# Patient Record
Sex: Male | Born: 1990 | Race: White | Hispanic: No | Marital: Single | Smoking: Current every day smoker
Health system: Southern US, Community
[De-identification: ages and names within clinical notes are randomized; demographics above are authoritative.]

## PROBLEM LIST (undated history)

## (undated) DIAGNOSIS — I10 Essential (primary) hypertension: Secondary | ICD-10-CM

## (undated) DIAGNOSIS — F32A Depression, unspecified: Secondary | ICD-10-CM

## (undated) DIAGNOSIS — R519 Headache, unspecified: Secondary | ICD-10-CM

## (undated) DIAGNOSIS — F319 Bipolar disorder, unspecified: Secondary | ICD-10-CM

## (undated) DIAGNOSIS — F419 Anxiety disorder, unspecified: Secondary | ICD-10-CM

## (undated) DIAGNOSIS — R011 Cardiac murmur, unspecified: Secondary | ICD-10-CM

## (undated) HISTORY — PX: KNEE SURGERY: SHX244

---
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
## 2012-01-17 ENCOUNTER — Emergency Department (HOSPITAL_COMMUNITY)
Admission: EM | Admit: 2012-01-17 | Discharge: 2012-01-17 | Disposition: A | Discharge status: Home / Self Care | Payer: Self-pay | Source: Home / Self Care | Attending: Emergency Medicine | Admitting: Emergency Medicine

## 2012-01-17 ENCOUNTER — Encounter (HOSPITAL_COMMUNITY): Payer: Self-pay | Admitting: *Deleted

## 2012-01-17 DIAGNOSIS — M7700 Medial epicondylitis, unspecified elbow: Secondary | ICD-10-CM

## 2012-01-17 DIAGNOSIS — L0292 Furuncle, unspecified: Secondary | ICD-10-CM

## 2012-01-17 MED ORDER — MUPIROCIN 2 % EX OINT: TOPICAL_OINTMENT | Freq: Three times a day (TID) | CUTANEOUS | Status: AC

## 2012-01-17 MED ORDER — MELOXICAM 7.5 MG PO TABS: 7.5000 mg | ORAL_TABLET | Freq: Every day | ORAL | Status: AC

## 2012-01-17 NOTE — ED Notes (Signed)
Pt reports elbow pain with no known injury that started hurting over 7 days ago. No obvious injury present - no bruising,swelling or redness noted.

## 2012-01-17 NOTE — ED Provider Notes (Signed)
History     CSN: [id number]  Arrival date & time 01/17/12  1708   First MD Initiated Contact with Patient 01/17/12 1716      Chief Complaint  Patient presents with  . Joint Swelling    (Consider location/radiation/quality/duration/timing/severity/associated sxs/prior treatment) HPI Comments: Patient presents to urgent care this afternoon experiencing right medial elbow pain that started about 7 days ago. Patient describes that he has not had any falls injuries or particular instance that he can relate to except for the fact that he plays basketball frequently. Patient denies any other symptoms such as redness, increased warmth, tingling or numbness sensation of his hand or shoulder pain. Patient also denies having had anything like this before. Patient also describes that he has at this 2 little bumps on his forearm in which he has tried to squeeze them to see if he could obtain some type of discharge but he never did.  The history is provided by the patient.    History reviewed. No pertinent past medical history.  History reviewed. No pertinent past surgical history.  Family History  Problem Relation Age of Onset  . Family history unknown: Yes    History  Substance Use Topics  . Smoking status: Never Smoker   . Smokeless tobacco: Not on file  . Alcohol Use: No      Review of Systems  Constitutional: Positive for activity change. Negative for fever, chills, diaphoresis, fatigue and unexpected weight change.    Allergies  Review of patient's allergies indicates not on file.  Home Medications   Current Outpatient Rx  Name Route Sig Dispense Refill  . MELOXICAM 7.5 MG PO TABS Oral Take 1 tablet (7.5 mg total) by mouth daily. 14 tablet 0  . MUPIROCIN 2 % EX OINT Topical Apply topically 3 (three) times daily. Apply to affected skin for 7 days 22 g 0    BP 150/90  Pulse 70  Temp 97.4 F (36.3 C) (Oral)  Resp 16  SpO2 100%  Physical Exam  Nursing note and vitals  reviewed. Constitutional: Vital signs are normal. He appears well-developed and well-nourished.  Non-toxic appearance. He does not have a sickly appearance. He does not appear ill. No distress.  Musculoskeletal: He exhibits tenderness. He exhibits no edema.       Right elbow: He exhibits swelling and deformity. He exhibits normal range of motion, no effusion and no laceration. tenderness found. Medial epicondyle tenderness noted.       Arms: Neurological: He is alert.  Skin: Skin is warm. Rash noted. Rash is papular. No erythema.       ED Course  Procedures (including critical care time)  Labs Reviewed - No data to display No results found.   1. Epicondylitis elbow, medial   2. Furunculosis       MDM  Right medial epicondylitis. Patient is reporting that he plays basketball and denies any recent falls or injuries that could explain an alternative orthopedic condition the size medial epicondylitis as suggested and consistent with his physical exam. No signs of a localized infectious process to suspect a septic joint. Have advised patient to use a helpful tennis sleeve to provide mild compression along with anti-inflammatory course of 2 weeks. Have advised him about symptoms that should warrant immediate attention which include increased swelling redness or any fevers or chills. Have also encouraged patient to followup with orthopedic Dr. if this pain was to persist despite this current recommended measures.  Jimmie Molly, MD 01/17/12 2031

## 2012-11-16 ENCOUNTER — Encounter (HOSPITAL_COMMUNITY): Payer: Self-pay | Admitting: *Deleted

## 2012-11-16 ENCOUNTER — Emergency Department (HOSPITAL_COMMUNITY)
Admission: EM | Admit: 2012-11-16 | Discharge: 2012-11-17 | Disposition: A | Discharge status: Other Acute Inpatient Hospital | Payer: No Typology Code available for payment source | Attending: Emergency Medicine | Admitting: Emergency Medicine

## 2012-11-16 DIAGNOSIS — F1210 Cannabis abuse, uncomplicated: Secondary | ICD-10-CM | POA: Insufficient documentation

## 2012-11-16 DIAGNOSIS — R451 Restlessness and agitation: Secondary | ICD-10-CM

## 2012-11-16 DIAGNOSIS — F10920 Alcohol use, unspecified with intoxication, uncomplicated: Secondary | ICD-10-CM

## 2012-11-16 DIAGNOSIS — IMO0002 Reserved for concepts with insufficient information to code with codable children: Secondary | ICD-10-CM | POA: Insufficient documentation

## 2012-11-16 DIAGNOSIS — F17200 Nicotine dependence, unspecified, uncomplicated: Secondary | ICD-10-CM | POA: Insufficient documentation

## 2012-11-16 DIAGNOSIS — F1010 Alcohol abuse, uncomplicated: Secondary | ICD-10-CM | POA: Insufficient documentation

## 2012-11-16 DIAGNOSIS — R45850 Homicidal ideations: Secondary | ICD-10-CM | POA: Insufficient documentation

## 2012-11-16 LAB — COMPREHENSIVE METABOLIC PANEL
ALT: 26 U/L (ref 0–53)
AST: 28 U/L (ref 0–37)
Albumin: 4.6 g/dL (ref 3.5–5.2)
Alkaline Phosphatase: 76 U/L (ref 39–117)
CO2: 26 mEq/L (ref 19–32)
Chloride: 98 mEq/L (ref 96–112)
Potassium: 3.8 mEq/L (ref 3.5–5.1)
Sodium: 136 mEq/L (ref 135–145)
Total Bilirubin: 0.4 mg/dL (ref 0.3–1.2)

## 2012-11-16 LAB — SALICYLATE LEVEL: Salicylate Lvl: <2 mg/dL — ABNORMAL LOW (ref 2.8–20.0)

## 2012-11-16 LAB — RAPID URINE DRUG SCREEN, HOSP PERFORMED
Amphetamines: NOT DETECTED
Benzodiazepines: NOT DETECTED
Tetrahydrocannabinol: POSITIVE — AB

## 2012-11-16 LAB — CBC
Platelets: 241 10*3/uL (ref 150–400)
RBC: 4.74 MIL/uL (ref 4.22–5.81)
RDW: 12.6 % (ref 11.5–15.5)
WBC: 8.5 10*3/uL (ref 4.0–10.5)

## 2012-11-16 LAB — ACETAMINOPHEN LEVEL: Acetaminophen (Tylenol), Serum: <15 ug/mL (ref 10–30)

## 2012-11-16 MED ORDER — LORAZEPAM 1 MG PO TABS: 1.0000 mg | ORAL_TABLET | Freq: Three times a day (TID) | ORAL | Status: DC | PRN

## 2012-11-16 MED ORDER — ZOLPIDEM TARTRATE 5 MG PO TABS: 5.0000 mg | ORAL_TABLET | Freq: Every evening | ORAL | Status: DC | PRN

## 2012-11-16 MED ORDER — ALUM & MAG HYDROXIDE-SIMETH 200-200-20 MG/5ML PO SUSP: 30.0000 mL | ORAL | Status: DC | PRN

## 2012-11-16 MED ORDER — IBUPROFEN 600 MG PO TABS: 600.0000 mg | ORAL_TABLET | Freq: Three times a day (TID) | ORAL | Status: DC | PRN

## 2012-11-16 MED ORDER — ONDANSETRON HCL 4 MG PO TABS: 4.0000 mg | ORAL_TABLET | Freq: Three times a day (TID) | ORAL | Status: DC | PRN

## 2012-11-16 MED ORDER — NICOTINE 21 MG/24HR TD PT24: 21.0000 mg | MEDICATED_PATCH | Freq: Every day | TRANSDERMAL | Status: DC

## 2012-11-16 NOTE — ED Notes (Signed)
[id number]> Expected date:<BR> Expected time:<BR> Means of arrival:<BR> Comments:<BR> ems
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
## 2012-11-17 ENCOUNTER — Inpatient Hospital Stay (HOSPITAL_COMMUNITY)
Admission: AD | Admit: 2012-11-17 | Payer: No Typology Code available for payment source | Source: Intra-hospital | Admitting: Psychiatry

## 2012-11-17 DIAGNOSIS — R413 Other amnesia: Secondary | ICD-10-CM

## 2012-11-17 DIAGNOSIS — F329 Major depressive disorder, single episode, unspecified: Secondary | ICD-10-CM

## 2012-11-17 DIAGNOSIS — G4700 Insomnia, unspecified: Secondary | ICD-10-CM

## 2012-11-17 DIAGNOSIS — F1910 Other psychoactive substance abuse, uncomplicated: Secondary | ICD-10-CM
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
## 2012-11-17 NOTE — ED Notes (Signed)
Pt was calm and cooperative and slept all night

## 2012-11-17 NOTE — ED Provider Notes (Signed)
First Opinion signed by me.   Devoria Albe, MD, Armando Gang   Ward Givens, MD 11/17/12 [phone]

## 2012-11-17 NOTE — BHH Counselor (Signed)
Britta Mccreedy RN at [facility] - pt is on wait list. She said [facility] may want to be considered for him. Auth #[id number]  Evette Cristal, LCSWA Assessment Counselor

## 2012-11-17 NOTE — ED Provider Notes (Signed)
Medical screening examination/treatment/procedure(s) were performed by non-physician practitioner and as supervising physician I was immediately available for consultation/collaboration.   Nelia Shi, MD 11/17/12 2228

## 2012-11-17 NOTE — BHH Counselor (Addendum)
Per Thayer Ohm, patient accepted to [facility] by Dr. Malvin Johns. Call report # [phone]. Marland Kitchen EDP-Dr. Rhunette Croft made aware of patient's discharge. Also, patient's nurse-Eric was made aware. Patient is here under IVC and will be transported to the facility via [employer]. Patient made aware of his disposition.

## 2012-11-17 NOTE — BHH Counselor (Signed)
6/8 (1133) declined at [facility] by [facility] due to acuity on a previous admission.   6/9 Pt referred to [facility], [place], [address], [place], and [facility].    6/9 No beds at [place], [place], [address], [place], [place], [place], [place], [facility], [place], and [facility]	[place].  6/9/ Pt referred and accepted to the [facility] waitlist, per Britta Mccreedy RN [id number] on 6/9. [facility] auth# [id number]  - 6/9 to 6/15

## 2012-11-17 NOTE — Progress Notes (Signed)
[facility] has seen patient and provided him with a list of primary care resources. °
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
## 2013-11-10 ENCOUNTER — Emergency Department (INDEPENDENT_AMBULATORY_CARE_PROVIDER_SITE_OTHER)
Admission: EM | Admit: 2013-11-10 | Discharge: 2013-11-10 | Disposition: A | Discharge status: Home / Self Care | Payer: Self-pay | Source: Home / Self Care | Attending: Family Medicine | Admitting: Family Medicine

## 2013-11-10 ENCOUNTER — Encounter (HOSPITAL_COMMUNITY): Payer: Self-pay | Admitting: Emergency Medicine

## 2013-11-10 DIAGNOSIS — L0390 Cellulitis, unspecified: Secondary | ICD-10-CM

## 2013-11-10 DIAGNOSIS — L089 Local infection of the skin and subcutaneous tissue, unspecified: Secondary | ICD-10-CM

## 2013-11-10 DIAGNOSIS — L723 Sebaceous cyst: Secondary | ICD-10-CM

## 2013-11-10 DIAGNOSIS — L0291 Cutaneous abscess, unspecified: Secondary | ICD-10-CM

## 2013-11-10 DIAGNOSIS — Z23 Encounter for immunization: Secondary | ICD-10-CM

## 2013-11-10 MED ORDER — TETANUS-DIPHTH-ACELL PERTUSSIS 5-2.5-18.5 LF-MCG/0.5 IM SUSP
INTRAMUSCULAR | Status: AC
  Filled 2013-11-10: qty 0.5

## 2013-11-10 MED ORDER — TETANUS-DIPHTH-ACELL PERTUSSIS 5-2.5-18.5 LF-MCG/0.5 IM SUSP
0.5000 mL | Freq: Once | INTRAMUSCULAR | Status: AC
  Administered 2013-11-10: 0.5 mL via INTRAMUSCULAR

## 2013-11-10 MED ORDER — SULFAMETHOXAZOLE-TRIMETHOPRIM 800-160 MG PO TABS: 1.0000 | ORAL_TABLET | Freq: Two times a day (BID) | ORAL | Status: DC

## 2013-11-10 NOTE — ED Provider Notes (Signed)
CSN: [id number]     Arrival date & time 11/10/13  1014 History   First MD Initiated Contact with Patient 11/10/13 1054     Chief Complaint  Patient presents with  . Recurrent Skin Infections   (Consider location/radiation/quality/duration/timing/severity/associated sxs/prior Treatment) HPI Comments: 23 year old male presents complaining of possible abscess in his right groin area. He has had a small bump there for about 6 months but it only recently became more swollen and tender, and very hard skin around it. He went with his father to his primary care physician who said it is probably an abscess that needs to be drained so he came here. He denies any systemic symptoms. He tried to poke it with a knife at home but it was too painful so he stopped. He did cause some pus to be expressed.   History reviewed. No pertinent past medical history. History reviewed. No pertinent past surgical history. No family history on file. History  Substance Use Topics  . Smoking status: Current Every Day Smoker  . Smokeless tobacco: Not on file  . Alcohol Use: Yes    Review of Systems  Skin: Positive for wound (see history of present illness).  All other systems reviewed and are negative.   Allergies  Review of patient's allergies indicates no known allergies.  Home Medications   Prior to Admission medications   Medication Sig Start Date End Date Taking? Authorizing Provider  sulfamethoxazole-trimethoprim (SEPTRA DS) 800-160 MG per tablet Take 1 tablet by mouth every 12 (twelve) hours. 11/10/13   Adrian Blackwater Kingstin Heims, PA-C   BP 148/99  Pulse 75  Temp(Src) 98.9 F (37.2 C) (Oral)  Resp 18  SpO2 97% Physical Exam  Nursing note and vitals reviewed. Constitutional: He is oriented to person, place, and time. He appears well-developed and well-nourished. No distress.  HENT:  Head: Normocephalic.  Pulmonary/Chest: Effort normal. No respiratory distress.  Neurological: He is alert and oriented to person,  place, and time. Coordination normal.  Skin: Skin is warm and dry. No rash noted. He is not diaphoretic.  In the right groin, there is a 2 x 4 cm area of induration with central fluctuance, with some bloody drainage centrally  Psychiatric: He has a normal mood and affect. Judgment normal.    ED Course  INCISION AND DRAINAGE Date/Time: 11/10/2013 11:27 AM Performed by: Autumn Messing, H Authorized by: Bradd Canary D Consent: Verbal consent obtained. Risks and benefits: risks, benefits and alternatives were discussed Consent given by: patient Patient identity confirmed: verbally with patient Time out: Immediately prior to procedure a "time out" was called to verify the correct patient, procedure, equipment, support staff and site/side marked as required. Type: abscess Location: Right groin. Anesthesia: local infiltration Local anesthetic: lidocaine 2% with epinephrine Anesthetic total: 4 ml Patient sedated: no Scalpel size: 11 Incision type: single straight Complexity: simple Drainage: bloody Drainage amount: scant Packing material: 1/4 in iodoform gauze Patient tolerance: Patient tolerated the procedure well with no immediate complications.   (including critical care time) Labs Review Labs Reviewed  CULTURE, ROUTINE-ABSCESS    Imaging Review No results found.   MDM   1. Infected sebaceous cyst   2. Abscess    Put on Bactrim for one week. Followup in 3 days for packing removal and wound check.  TDaP given    Meds ordered this encounter  Medications  . sulfamethoxazole-trimethoprim (SEPTRA DS) 800-160 MG per tablet    Sig: Take 1 tablet by mouth every 12 (twelve) hours.  Dispense:  14 tablet    Refill:  0    Order Specific Question:  Supervising Provider    Answer:  Bradd CanaryKINDL, JAMES D [[id number]]       Graylon GoodZachary H Yamilee Harmes, PA-C 11/10/13 1135

## 2013-11-10 NOTE — Discharge Instructions (Signed)
Abscess  Care After  An abscess (also called a boil or furuncle) is an infected area that contains a collection of pus. Signs and symptoms of an abscess include pain, tenderness, redness, or hardness, or you may feel a moveable soft area under your skin. An abscess can occur anywhere in the body. The infection may spread to surrounding tissues causing cellulitis. A cut (incision) by the surgeon was made over your abscess and the pus was drained out. Gauze may have been packed into the space to provide a drain that will allow the cavity to heal from the inside outwards. The boil may be painful for 5 to 7 days. Most people with a boil do not have high fevers. Your abscess, if seen early, may not have localized, and may not have been lanced. If not, another appointment may be required for this if it does not get better on its own or with medications.  HOME CARE INSTRUCTIONS   · Only take over-the-counter or prescription medicines for pain, discomfort, or fever as directed by your caregiver.  · When you bathe, soak and then remove gauze or iodoform packs at least daily or as directed by your caregiver. You may then wash the wound gently with mild soapy water. Repack with gauze or do as your caregiver directs.  SEEK IMMEDIATE MEDICAL CARE IF:   · You develop increased pain, swelling, redness, drainage, or bleeding in the wound site.  · You develop signs of generalized infection including muscle aches, chills, fever, or a general ill feeling.  · An oral temperature above 102° F (38.9° C) develops, not controlled by medication.  See your caregiver for a recheck if you develop any of the symptoms described above. If medications (antibiotics) were prescribed, take them as directed.  Document Released: 12/14/2004 Document Revised: 08/20/2011 Document Reviewed: 08/11/2007  [employer]® Patient Information ©2014 [employer].

## 2013-11-10 NOTE — ED Provider Notes (Signed)
Medical screening examination/treatment/procedure(s) were performed by resident physician or non-physician practitioner and as supervising physician I was immediately available for consultation/collaboration.   Muhammadali Ries DOUGLAS MD.   Morrissa Shein D Viggo Perko, MD 11/10/13 1553 

## 2013-11-10 NOTE — ED Notes (Signed)
Patient complains of abscess right groin.

## 2013-11-13 ENCOUNTER — Emergency Department (INDEPENDENT_AMBULATORY_CARE_PROVIDER_SITE_OTHER)
Admission: EM | Admit: 2013-11-13 | Discharge: 2013-11-13 | Disposition: A | Discharge status: Home / Self Care | Payer: Self-pay | Source: Home / Self Care | Attending: Family Medicine | Admitting: Family Medicine

## 2013-11-13 ENCOUNTER — Encounter (HOSPITAL_COMMUNITY): Payer: Self-pay | Admitting: Emergency Medicine

## 2013-11-13 DIAGNOSIS — L0390 Cellulitis, unspecified: Secondary | ICD-10-CM

## 2013-11-13 DIAGNOSIS — L0291 Cutaneous abscess, unspecified: Secondary | ICD-10-CM

## 2013-11-13 LAB — CULTURE, ROUTINE-ABSCESS: Culture: NO GROWTH

## 2013-11-13 NOTE — Discharge Instructions (Signed)
Thank you for coming in today. Come back as needed.   Abscess Care After An abscess (also called a boil or furuncle) is an infected area that contains a collection of pus. Signs and symptoms of an abscess include pain, tenderness, redness, or hardness, or you may feel a moveable soft area under your skin. An abscess can occur anywhere in the body. The infection may spread to surrounding tissues causing cellulitis. A cut (incision) by the surgeon was made over your abscess and the pus was drained out. Gauze may have been packed into the space to provide a drain that will allow the cavity to heal from the inside outwards. The boil may be painful for 5 to 7 days. Most people with a boil do not have high fevers. Your abscess, if seen early, may not have localized, and may not have been lanced. If not, another appointment may be required for this if it does not get better on its own or with medications. HOME CARE INSTRUCTIONS   Only take over-the-counter or prescription medicines for pain, discomfort, or fever as directed by your caregiver.  When you bathe, soak and then remove gauze or iodoform packs at least daily or as directed by your caregiver. You may then wash the wound gently with mild soapy water. Repack with gauze or do as your caregiver directs. SEEK IMMEDIATE MEDICAL CARE IF:   You develop increased pain, swelling, redness, drainage, or bleeding in the wound site.  You develop signs of generalized infection including muscle aches, chills, fever, or a general ill feeling.  An oral temperature above 102 F (38.9 C) develops, not controlled by medication. See your caregiver for a recheck if you develop any of the symptoms described above. If medications (antibiotics) were prescribed, take them as directed. Document Released: 12/14/2004 Document Revised: 08/20/2011 Document Reviewed: 08/11/2007 [facility] Patient Information 2014 [place], [place].

## 2013-11-13 NOTE — ED Notes (Signed)
Pt here for follow up of cyst in groin area.  States still tender to touch and having some drainage

## 2013-11-13 NOTE — ED Provider Notes (Signed)
Adam Novak is a 23 y.o. male who presents to Urgent Care today for abscess. Patient is here today to followup abscess in the right groin. It was incised drained and packed 3 days ago. He feels well with no fevers chills nausea vomiting or diarrhea. Pain is improving significantly.   History reviewed. No pertinent past medical history. History  Substance Use Topics  . Smoking status: Current Every Day Smoker  . Smokeless tobacco: Not on file  . Alcohol Use: Yes   ROS as above Medications: No current facility-administered medications for this encounter.   Current Outpatient Prescriptions  Medication Sig Dispense Refill  . sulfamethoxazole-trimethoprim (SEPTRA DS) 800-160 MG per tablet Take 1 tablet by mouth every 12 (twelve) hours.  14 tablet  0    Exam:  BP 138/72  Pulse 70  Temp(Src) 99.1 F (37.3 C) (Oral)  Resp 18  SpO2 99% Gen: Well NAD Skin: Well appearing right groin abscess. No surrounding erythema or induration. Minimally tender. Packing removed.  No results found for this or any previous visit (from the past 24 hour(s)). No results found.  Assessment and Plan: 23 y.o. male with abscess. Followup as needed.  Discussed warning signs or symptoms. Please see discharge instructions. Patient expresses understanding.    Rodolph Bong, MD 11/13/13 [phone]

## 2014-01-10 ENCOUNTER — Emergency Department (HOSPITAL_COMMUNITY): Payer: Self-pay

## 2014-01-10 ENCOUNTER — Emergency Department (HOSPITAL_COMMUNITY)
Admission: EM | Admit: 2014-01-10 | Discharge: 2014-01-10 | Disposition: A | Discharge status: Home / Self Care | Payer: Self-pay | Attending: Emergency Medicine | Admitting: Emergency Medicine

## 2014-01-10 ENCOUNTER — Encounter (HOSPITAL_COMMUNITY): Payer: Self-pay | Admitting: Emergency Medicine

## 2014-01-10 DIAGNOSIS — S61220A Laceration with foreign body of right index finger without damage to nail, initial encounter: Secondary | ICD-10-CM

## 2014-01-10 DIAGNOSIS — S6990XA Unspecified injury of unspecified wrist, hand and finger(s), initial encounter: Secondary | ICD-10-CM | POA: Insufficient documentation

## 2014-01-10 DIAGNOSIS — Y9389 Activity, other specified: Secondary | ICD-10-CM | POA: Insufficient documentation

## 2014-01-10 DIAGNOSIS — S6980XA Other specified injuries of unspecified wrist, hand and finger(s), initial encounter: Secondary | ICD-10-CM | POA: Insufficient documentation

## 2014-01-10 DIAGNOSIS — Y9289 Other specified places as the place of occurrence of the external cause: Secondary | ICD-10-CM | POA: Insufficient documentation

## 2014-01-10 DIAGNOSIS — F17200 Nicotine dependence, unspecified, uncomplicated: Secondary | ICD-10-CM | POA: Insufficient documentation

## 2014-01-10 DIAGNOSIS — S61209A Unspecified open wound of unspecified finger without damage to nail, initial encounter: Secondary | ICD-10-CM | POA: Insufficient documentation

## 2014-01-10 DIAGNOSIS — W208XXA Other cause of strike by thrown, projected or falling object, initial encounter: Secondary | ICD-10-CM | POA: Insufficient documentation

## 2014-01-10 MED ORDER — CEPHALEXIN 500 MG PO CAPS: 500.0000 mg | ORAL_CAPSULE | Freq: Four times a day (QID) | ORAL | Status: DC

## 2014-01-10 MED ORDER — CEPHALEXIN 250 MG PO CAPS
500.0000 mg | ORAL_CAPSULE | Freq: Once | ORAL | Status: AC
  Administered 2014-01-10: 500 mg via ORAL
  Filled 2014-01-10: qty 2

## 2014-01-10 MED ORDER — HYDROCODONE-ACETAMINOPHEN 5-325 MG PO TABS
1.0000 | ORAL_TABLET | Freq: Once | ORAL | Status: AC
  Administered 2014-01-10: 1 via ORAL
  Filled 2014-01-10: qty 1

## 2014-01-10 MED ORDER — TRAMADOL HCL 50 MG PO TABS: 50.0000 mg | ORAL_TABLET | Freq: Four times a day (QID) | ORAL | Status: DC | PRN

## 2014-01-10 NOTE — ED Notes (Signed)
Pt reports moving furniture this morning when a dresser fell on his hand. Laceration noted to index and ring finger on right hand. Pt also reports numbness to affected fingers, full ROM noted. Denies any tingling to fingers. nad noted.

## 2014-01-10 NOTE — ED Provider Notes (Signed)
CSN: [id number]     Arrival date & time 01/10/14  4696 History   First MD Initiated Contact with Patient 01/10/14 [phone]     Chief Complaint  Patient presents with  . Finger Injury     (Consider location/radiation/quality/duration/timing/severity/associated sxs/prior Treatment) HPI  Adam Novak is a 23 y.o. male complaining of severe right hand pain (patient is right-hand dominant) after television fell on the hand while he was moving approximately one hour prior to arrival. Pain is exacerbated by movement and palpation. No pain medications were taken before coming to the ED.Marland Kitchen Patient states pain is severe, exacerbated by movement. Last tetanus shot was within the last 6 months. Patient denies any numbness weakness or paralysis. Patient states the screen as a TV shattered. Thinks there maybe foreign bodies in the wound.  History reviewed. No pertinent past medical history. Past Surgical History  Procedure Laterality Date  . Knee surgery Right    No family history on file. History  Substance Use Topics  . Smoking status: Current Some Day Smoker  . Smokeless tobacco: Not on file  . Alcohol Use: Yes     Comment: socially    Review of Systems  10 systems reviewed and found to be negative, except as noted in the HPI.   Allergies  Review of patient's allergies indicates no known allergies.  Home Medications   Prior to Admission medications   Medication Sig Start Date End Date Taking? Authorizing Provider  ibuprofen (ADVIL,MOTRIN) 200 MG tablet Take 600 mg by mouth once.   Yes Historical Provider, MD  cephALEXin (KEFLEX) 500 MG capsule Take 1 capsule (500 mg total) by mouth 4 (four) times daily. 01/10/14   Adam Smither, PA-C  traMADol (ULTRAM) 50 MG tablet Take 1 tablet (50 mg total) by mouth every 6 (six) hours as needed. 01/10/14   Adam Weinkauf, PA-C   BP 114/67  Pulse 77  Temp(Src) 97.8 F (36.6 C) (Oral)  Resp 16  SpO2 96% Physical Exam  Nursing note and vitals  reviewed. Constitutional: He is oriented to person, place, and time. He appears well-developed and well-nourished. No distress.  HENT:  Head: Normocephalic.  Eyes: Conjunctivae and EOM are normal.  Cardiovascular: Normal rate.   Pulmonary/Chest: Effort normal. No stridor.  Musculoskeletal: Normal range of motion.       Hands: Full-thickness laceration to dorsal side of the proximal anterior for now and she'll joints on the right second and fourth digit. Patient is neurovascularly intact. Patient has excellent range of motion in both flexion and extension, there is no gross foreign body contamination, bleeding is now controlled.  Neurological: He is alert and oriented to person, place, and time.  Psychiatric: He has a normal mood and affect.    ED Course  FOREIGN BODY REMOVAL Date/Time: 01/10/2014 12:01 PM Performed by: Adam Novak Authorized by: Adam Novak Consent: Verbal consent obtained. Consent given by: patient Patient identity confirmed: verbally with patient and arm band Body area: skin General location: upper extremity Location details: right index finger Anesthesia: digital block Anesthetic total: 4 ml Patient sedated: no Localization method: magnification, probed and visualized Removal mechanism: forceps and irrigation Dressing: antibiotic ointment Tendon involvement: none Depth: subcutaneous Complexity: complex 2 objects recovered. Objects recovered: glass shards Post-procedure assessment: residual foreign bodies remain Patient tolerance: Patient tolerated the procedure well with no immediate complications.   (including critical care time)  LACERATION REPAIR Performed by: Adam Novak Consent: Verbal consent obtained. Risks and benefits: risks, benefits and alternatives were discussed Consent  given by: patient Patient identity confirmed: Wrist band   Wound explored to depth in good light on a bloodless field, with no foreign bodies seen or  palpated.  Prepped and draped in normal sterile fashion    Tetanus: UTD  Laceration Location: right second digit PIP dorsal side  Laceration Length: 4cm  Anesthesia: nerve block  Local anesthetic: Lidocaine 2%  without epinephrine  Anesthetic total: 4 ml  Irrigation method: Low Pressure  Amount of cleaning: 2L sterile NS  Skin closure: 5-0 Ethilon  Number of sutures: 11  Technique: 10 running locking and one simple interrupted   Tetanus: UTD  Laceration Location: right 4th digit PIP dorsal side  Laceration Length:1.5cm  Anesthesia: nerve block  Local anesthetic: Lidocaine 2%  without epinephrine  Anesthetic total: 4 ml  Irrigation method: Low Pressure  Amount of cleaning: 1L sterile NS  Skin closure: 5-0 Ethilon  Number of sutures:6  Technique: running locking   Patient tolerance: Patient tolerated the procedure well with no immediate complications.   Antibx ointment applied. Instructions for care discussed verbally and patient provided with additional written instructions for homecare and f/u.  Labs Review Labs Reviewed - No data to display  Imaging Review Dg Hand Complete Right  01/10/2014   CLINICAL DATA:  Hand pain, multiple lacerations  EXAM: RIGHT HAND - COMPLETE 3+ VIEW  COMPARISON:  Prior radiographs of the right hand 06/12/2010  FINDINGS: No acute fracture or malalignment. Small radiopacities noted in the soft tissues dorsal to the index finger proximal interphalangeal joint concerning for imbedded radiopaque foreign bodies. There is a focal disruption of the soft tissues in the same region consistent with laceration. Additional soft tissue irregularity overlying the proximal interphalangeal joint of the ring finger. No imbedded radiopaque foreign body at that site. No acute fracture or malalignment. Residual posttraumatic deformity from a healed boxer's fracture of the fifth metacarpal.  IMPRESSION: 1. Soft tissue laceration with multiples tiny imbedded radiopaque  foreign bodies dorsal to the index finger PIP joint. 2. Soft tissue laceration without evidence of foreign body in the soft tissues dorsal to the ring finger PIP joint. 3. Remote healed boxer's fracture. 4. No acute fracture or malalignment.   Electronically Signed   By: Malachy MoanHeath  McCullough M.D.   On: 01/10/2014 10:19     EKG Interpretation None      MDM   Final diagnoses:  Laceration of right index finger with foreign body without damage to nail, initial encounter    Filed Vitals:   01/10/14 0835 01/10/14 1157  BP: 148/85 114/67  Pulse: 88 77  Temp: 98 F (36.7 C) 97.8 F (36.6 C)  TempSrc: Oral Oral  Resp: 20 16  SpO2: 98% 96%    Medications  HYDROcodone-acetaminophen (NORCO/VICODIN) 5-325 MG per tablet 1 tablet (1 tablet Oral Given 01/10/14 0912)  cephALEXin (KEFLEX) capsule 500 mg (500 mg Oral Given 01/10/14 1155)    Adele DanBrandon J Griffey is a 23 y.o. male presenting with full-thickness lacerations to right (dominant) second and fourth digits. There is contamination to the index finger with multiple foreign body seen on x-ray. 2 foreign bodies were removed manually however, I think that there are several but still remain. Have advised the patient that puts him at greater risk for infection. We have had an extensive discussion of wound care and return precautions. Patient will be started on Keflex as well. I will to follow with hand surgeon Dr. Mina MarbleWeingold  for a checkup. Wounds are closed with Ethilon and patient is  placed in finger splint, advised use for the next 24-48 hours.  Evaluation does not show pathology that would require ongoing emergent intervention or inpatient treatment. Pt is hemodynamically stable and mentating appropriately. Discussed findings and plan with patient/guardian, who agrees with care plan. All questions answered. Return precautions discussed and outpatient follow up given.      Adam Emery, PA-C 01/10/14 1206

## 2014-01-10 NOTE — ED Notes (Signed)
Declined W/C at D/C and was escorted to lobby by RN. 

## 2014-01-10 NOTE — Discharge Instructions (Signed)
Keep wound dry and do not remove dressing for 24 hours if possible. After that, wash gently morning and night (every 12 hours) with soap and water. Use a topical antibiotic ointment and cover with a bandaid or gauze.  °  °Do NOT use rubbing alcohol or hydrogen peroxide, do not soak the area °  °Present to your primary care doctor or the urgent care of your choice, or the ED for suture removal in 7-10 days. °  °Every attempt was made to remove foreign body (contaminants) from the wound.  However, there is always a chance that some may remain in the wound. This can  increase your risk of infection. °  °If you see signs of infection (warmth, redness, tenderness, pus, sharp increase in pain, fever, red streaking in the skin) immediately return to the emergency department. °  °After the wound heals fully, apply sunscreen for 6-12 months to minimize scarring.  ° ° °Laceration Care, Adult °A laceration is a cut or lesion that goes through all layers of the skin and into the tissue just beneath the skin. °TREATMENT  °Some lacerations may not require closure. Some lacerations may not be able to be closed due to an increased risk of infection. It is important to see your caregiver as soon as possible after an injury to minimize the risk of infection and maximize the opportunity for successful closure. °If closure is appropriate, pain medicines may be given, if needed. The wound will be cleaned to help prevent infection. Your caregiver will use stitches (sutures), staples, wound glue (adhesive), or skin adhesive strips to repair the laceration. These tools bring the skin edges together to allow for faster healing and a better cosmetic outcome. However, all wounds will heal with a scar. Once the wound has healed, scarring can be minimized by covering the wound with sunscreen during the day for 1 full year. °HOME CARE INSTRUCTIONS  °For sutures or staples: °· Keep the wound clean and dry. °· If you were given a bandage  (dressing), you should change it at least once a day. Also, change the dressing if it becomes wet or dirty, or as directed by your caregiver. °· Wash the wound with soap and water 2 times a day. Rinse the wound off with water to remove all soap. Pat the wound dry with a clean towel. °· After cleaning, apply a thin layer of the antibiotic ointment as recommended by your caregiver. This will help prevent infection and keep the dressing from sticking. °· You may shower as usual after the first 24 hours. Do not soak the wound in water until the sutures are removed. °· Only take over-the-counter or prescription medicines for pain, discomfort, or fever as directed by your caregiver. °· Get your sutures or staples removed as directed by your caregiver. °For skin adhesive strips: °· Keep the wound clean and dry. °· Do not get the skin adhesive strips wet. You may bathe carefully, using caution to keep the wound dry. °· If the wound gets wet, pat it dry with a clean towel. °· Skin adhesive strips will fall off on their own. You may trim the strips as the wound heals. Do not remove skin adhesive strips that are still stuck to the wound. They will fall off in time. °For wound adhesive: °· You may briefly wet your wound in the shower or bath. Do not soak or scrub the wound. Do not swim. Avoid periods of heavy perspiration until the skin adhesive has fallen off   on its own. After showering or bathing, gently pat the wound dry with a clean towel. °· Do not apply liquid medicine, cream medicine, or ointment medicine to your wound while the skin adhesive is in place. This may loosen the film before your wound is healed. °· If a dressing is placed over the wound, be careful not to apply tape directly over the skin adhesive. This may cause the adhesive to be pulled off before the wound is healed. °· Avoid prolonged exposure to sunlight or tanning lamps while the skin adhesive is in place. Exposure to ultraviolet light in the first  year will darken the scar. °· The skin adhesive will usually remain in place for 5 to 10 days, then naturally fall off the skin. Do not pick at the adhesive film. °You may need a tetanus shot if: °· You cannot remember when you had your last tetanus shot. °· You have never had a tetanus shot. °If you get a tetanus shot, your arm may swell, get red, and feel warm to the touch. This is common and not a problem. If you need a tetanus shot and you choose not to have one, there is a rare chance of getting tetanus. Sickness from tetanus can be serious. °SEEK MEDICAL CARE IF:  °· You have redness, swelling, or increasing pain in the wound. °· You see a red line that goes away from the wound. °· You have yellowish-white fluid (pus) coming from the wound. °· You have a fever. °· You notice a bad smell coming from the wound or dressing. °· Your wound breaks open before or after sutures have been removed. °· You notice something coming out of the wound such as wood or glass. °· Your wound is on your hand or foot and you cannot move a finger or toe. °SEEK IMMEDIATE MEDICAL CARE IF:  °· Your pain is not controlled with prescribed medicine. °· You have severe swelling around the wound causing pain and numbness or a change in color in your arm, hand, leg, or foot. °· Your wound splits open and starts bleeding. °· You have worsening numbness, weakness, or loss of function of any joint around or beyond the wound. °· You develop painful lumps near the wound or on the skin anywhere on your body. °MAKE SURE YOU:  °· Understand these instructions. °· Will watch your condition. °· Will get help right away if you are not doing well or get worse. °Document Released: 05/28/2005 Document Revised: 08/20/2011 Document Reviewed: 11/21/2010 °[employer]® Patient Information ©2015 [employer]. This information is not intended to replace advice given to you by your health care provider. Make sure you discuss any questions you have with your health  care provider. ° ° °

## 2014-01-10 NOTE — ED Provider Notes (Signed)
Medical screening examination/treatment/procedure(s) were performed by non-physician practitioner and as supervising physician I was immediately available for consultation/collaboration.   EKG Interpretation None       Doug SouSam Geovannie Vilar, MD 01/10/14 1815

## 2014-06-18 ENCOUNTER — Encounter (HOSPITAL_COMMUNITY): Payer: Self-pay

## 2014-06-18 ENCOUNTER — Emergency Department (HOSPITAL_COMMUNITY): Payer: Self-pay

## 2014-06-18 ENCOUNTER — Inpatient Hospital Stay (HOSPITAL_COMMUNITY)
Admission: EM | Admit: 2014-06-18 | Discharge: 2014-06-20 | DRG: 896 | Disposition: A | Discharge status: Home / Self Care | Payer: Self-pay | Attending: Internal Medicine | Admitting: Internal Medicine

## 2014-06-18 DIAGNOSIS — R000 Tachycardia, unspecified: Secondary | ICD-10-CM | POA: Diagnosis present

## 2014-06-18 DIAGNOSIS — G92 Toxic encephalopathy: Secondary | ICD-10-CM | POA: Diagnosis present

## 2014-06-18 DIAGNOSIS — F10921 Alcohol use, unspecified with intoxication delirium: Secondary | ICD-10-CM

## 2014-06-18 DIAGNOSIS — F1910 Other psychoactive substance abuse, uncomplicated: Secondary | ICD-10-CM | POA: Insufficient documentation

## 2014-06-18 DIAGNOSIS — Z79891 Long term (current) use of opiate analgesic: Secondary | ICD-10-CM

## 2014-06-18 DIAGNOSIS — F17210 Nicotine dependence, cigarettes, uncomplicated: Secondary | ICD-10-CM | POA: Diagnosis present

## 2014-06-18 DIAGNOSIS — R4182 Altered mental status, unspecified: Secondary | ICD-10-CM

## 2014-06-18 DIAGNOSIS — Z792 Long term (current) use of antibiotics: Secondary | ICD-10-CM

## 2014-06-18 DIAGNOSIS — F10239 Alcohol dependence with withdrawal, unspecified: Principal | ICD-10-CM | POA: Diagnosis present

## 2014-06-18 DIAGNOSIS — F1010 Alcohol abuse, uncomplicated: Secondary | ICD-10-CM | POA: Diagnosis present

## 2014-06-18 DIAGNOSIS — F10939 Alcohol use, unspecified with withdrawal, unspecified: Secondary | ICD-10-CM | POA: Diagnosis present

## 2014-06-18 DIAGNOSIS — F39 Unspecified mood [affective] disorder: Secondary | ICD-10-CM | POA: Diagnosis present

## 2014-06-18 DIAGNOSIS — R740 Nonspecific elevation of levels of transaminase and lactic acid dehydrogenase [LDH]: Secondary | ICD-10-CM | POA: Diagnosis present

## 2014-06-18 DIAGNOSIS — G9340 Encephalopathy, unspecified: Secondary | ICD-10-CM | POA: Diagnosis present

## 2014-06-18 DIAGNOSIS — F1210 Cannabis abuse, uncomplicated: Secondary | ICD-10-CM | POA: Diagnosis present

## 2014-06-18 DIAGNOSIS — S81852A Open bite, left lower leg, initial encounter: Secondary | ICD-10-CM | POA: Diagnosis present

## 2014-06-18 DIAGNOSIS — K7010 Alcoholic hepatitis without ascites: Secondary | ICD-10-CM | POA: Diagnosis present

## 2014-06-18 DIAGNOSIS — Z609 Problem related to social environment, unspecified: Secondary | ICD-10-CM | POA: Diagnosis present

## 2014-06-18 DIAGNOSIS — I4510 Unspecified right bundle-branch block: Secondary | ICD-10-CM | POA: Diagnosis present

## 2014-06-18 DIAGNOSIS — G929 Unspecified toxic encephalopathy: Secondary | ICD-10-CM | POA: Diagnosis present

## 2014-06-18 DIAGNOSIS — Z791 Long term (current) use of non-steroidal anti-inflammatories (NSAID): Secondary | ICD-10-CM

## 2014-06-18 DIAGNOSIS — D72829 Elevated white blood cell count, unspecified: Secondary | ICD-10-CM | POA: Insufficient documentation

## 2014-06-18 DIAGNOSIS — W540XXA Bitten by dog, initial encounter: Secondary | ICD-10-CM | POA: Diagnosis present

## 2014-06-18 DIAGNOSIS — F10129 Alcohol abuse with intoxication, unspecified: Secondary | ICD-10-CM | POA: Diagnosis present

## 2014-06-18 LAB — CBC WITH DIFFERENTIAL/PLATELET
BASOS PCT: 0 % (ref 0–1)
Basophils Absolute: 0 10*3/uL (ref 0.0–0.1)
EOS ABS: 0.1 10*3/uL (ref 0.0–0.7)
Eosinophils Relative: 1 % (ref 0–5)
HEMATOCRIT: 44.4 % (ref 39.0–52.0)
Hemoglobin: 14.9 g/dL (ref 13.0–17.0)
LYMPHS ABS: 5 10*3/uL — AB (ref 0.7–4.0)
LYMPHS PCT: 35 % (ref 12–46)
MCH: 32.9 pg (ref 26.0–34.0)
MCHC: 33.6 g/dL (ref 30.0–36.0)
MCV: 98 fL (ref 78.0–100.0)
MONO ABS: 1 10*3/uL (ref 0.1–1.0)
Monocytes Relative: 7 % (ref 3–12)
NEUTROS ABS: 8.3 10*3/uL — AB (ref 1.7–7.7)
Neutrophils Relative %: 57 % (ref 43–77)
Platelets: 242 10*3/uL (ref 150–400)
RBC: 4.53 MIL/uL (ref 4.22–5.81)
RDW: 12.3 % (ref 11.5–15.5)
WBC: 14.3 10*3/uL — AB (ref 4.0–10.5)

## 2014-06-18 LAB — CK: Total CK: 247 U/L — ABNORMAL HIGH (ref 7–232)

## 2014-06-18 LAB — SALICYLATE LEVEL: Salicylate Lvl: <4 mg/dL (ref 2.8–20.0)

## 2014-06-18 LAB — COMPREHENSIVE METABOLIC PANEL
ALK PHOS: 69 U/L (ref 39–117)
ALT: 21 U/L (ref 0–53)
AST: 32 U/L (ref 0–37)
Albumin: 5 g/dL (ref 3.5–5.2)
Anion gap: 13 (ref 5–15)
BILIRUBIN TOTAL: 0.8 mg/dL (ref 0.3–1.2)
BUN: 12 mg/dL (ref 6–23)
CALCIUM: 9.3 mg/dL (ref 8.4–10.5)
CHLORIDE: 102 meq/L (ref 96–112)
CO2: 26 mmol/L (ref 19–32)
CREATININE: 0.98 mg/dL (ref 0.50–1.35)
GFR calc Af Amer: >90 mL/min (ref >90)
GFR calc non Af Amer: >90 mL/min (ref >90)
GLUCOSE: 122 mg/dL — AB (ref 70–99)
Potassium: 4.2 mmol/L (ref 3.5–5.1)
SODIUM: 141 mmol/L (ref 135–145)
Total Protein: 8.3 g/dL (ref 6.0–8.3)

## 2014-06-18 LAB — URINALYSIS, ROUTINE W REFLEX MICROSCOPIC
BILIRUBIN URINE: NEGATIVE
GLUCOSE, UA: NEGATIVE mg/dL
Ketones, ur: NEGATIVE mg/dL
Leukocytes, UA: NEGATIVE
NITRITE: NEGATIVE
PROTEIN: NEGATIVE mg/dL
SPECIFIC GRAVITY, URINE: 1.003 — AB (ref 1.005–1.030)
UROBILINOGEN UA: 0.2 mg/dL (ref 0.0–1.0)
pH: 6.5 (ref 5.0–8.0)

## 2014-06-18 LAB — RAPID URINE DRUG SCREEN, HOSP PERFORMED
Amphetamines: NOT DETECTED
BARBITURATES: NOT DETECTED
BENZODIAZEPINES: NOT DETECTED
Cocaine: NOT DETECTED
Opiates: NOT DETECTED
Tetrahydrocannabinol: POSITIVE — AB

## 2014-06-18 LAB — ETHANOL: Alcohol, Ethyl (B): 245 mg/dL — ABNORMAL HIGH (ref 0–9)

## 2014-06-18 LAB — URINE MICROSCOPIC-ADD ON

## 2014-06-18 LAB — ACETAMINOPHEN LEVEL

## 2014-06-18 MED ORDER — SODIUM CHLORIDE 0.9 % IV BOLUS (SEPSIS)
1000.0000 mL | Freq: Once | INTRAVENOUS | Status: AC
  Administered 2014-06-18: 1000 mL via INTRAVENOUS

## 2014-06-18 MED ORDER — NALOXONE HCL 1 MG/ML IJ SOLN
1.0000 mg | Freq: Once | INTRAMUSCULAR | Status: AC
  Administered 2014-06-18: 1 mg via INTRAVENOUS
  Filled 2014-06-18: qty 2

## 2014-06-18 MED ORDER — HALOPERIDOL LACTATE 5 MG/ML IJ SOLN
5.0000 mg | Freq: Once | INTRAMUSCULAR | Status: AC
  Administered 2014-06-18: 5 mg via INTRAVENOUS
  Filled 2014-06-18: qty 1

## 2014-06-18 NOTE — ED Notes (Signed)
Seizure pads placed on bed for pt safety along with restraints.

## 2014-06-18 NOTE — ED Notes (Signed)
Home: [phone]  Cell:[phone] Barry DienesJames Rhines (Father)

## 2014-06-18 NOTE — ED Provider Notes (Signed)
CSN: [id number]     Arrival date & time 06/18/14  1820 History   First MD Initiated Contact with Patient 06/18/14 1837     Chief Complaint  Patient presents with  . Altered Mental Status  . Aggressive Behavior     (Consider location/radiation/quality/duration/timing/severity/associated sxs/prior Treatment) HPI.... Level 5 caveat for alcohol intoxication and psychotic behavior. Patient was involuntarily committed by his father for homicidal and suicidal ideation. He has a known serious drinking problem and threatens family members. He presented to the emergency department by the [facility]. Impossible to get history from patient.  History reviewed. No pertinent past medical history. Past Surgical History  Procedure Laterality Date  . Knee surgery Right    History reviewed. No pertinent family history. History  Substance Use Topics  . Smoking status: Current Some Day Smoker  . Smokeless tobacco: Not on file  . Alcohol Use: Yes     Comment: socially    Review of Systems  Unable to perform ROS: Acuity of condition      Allergies  Review of patient's allergies indicates no known allergies.  Home Medications   Prior to Admission medications   Medication Sig Start Date End Date Taking? Authorizing Provider  cephALEXin (KEFLEX) 500 MG capsule Take 1 capsule (500 mg total) by mouth 4 (four) times daily. 01/10/14   Nicole Pisciotta, PA-C  ibuprofen (ADVIL,MOTRIN) 200 MG tablet Take 600 mg by mouth once.    Historical Provider, MD  traMADol (ULTRAM) 50 MG tablet Take 1 tablet (50 mg total) by mouth every 6 (six) hours as needed. 01/10/14   Nicole Pisciotta, PA-C   BP 114/52 mmHg  Pulse 111  Temp(Src) 98.1 F (36.7 C) (Oral)  Resp 24  SpO2 96% Physical Exam  Constitutional:  Thrashing, talking coherently.  HENT:  Head: Normocephalic and atraumatic.  Eyes: Conjunctivae and EOM are normal. Pupils are equal, round, and reactive to light.  Neck: Normal range of  motion. Neck supple.  Cardiovascular: Normal rate and regular rhythm.   Pulmonary/Chest: Effort normal and breath sounds normal.  Abdominal: Soft. Bowel sounds are normal.  Musculoskeletal: Normal range of motion.  Neurological:  Moving all extremities, but not alert  Skin:  Several areas of ecchymosis  Psychiatric:  Mumbling, aggressive behavior  Nursing note and vitals reviewed.   ED Course  Procedures (including critical care time) Labs Review Labs Reviewed  CBC WITH DIFFERENTIAL - Abnormal; Notable for the following:    WBC 14.3 (*)    Neutro Abs 8.3 (*)    Lymphs Abs 5.0 (*)    All other components within normal limits  COMPREHENSIVE METABOLIC PANEL - Abnormal; Notable for the following:    Glucose, Bld 122 (*)    All other components within normal limits  URINALYSIS, ROUTINE W REFLEX MICROSCOPIC - Abnormal; Notable for the following:    APPearance CLOUDY (*)    Specific Gravity, Urine 1.003 (*)    Hgb urine dipstick TRACE (*)    All other components within normal limits  ACETAMINOPHEN LEVEL - Abnormal; Notable for the following:    Acetaminophen (Tylenol), Serum <10.0 (*)    All other components within normal limits  URINE RAPID DRUG SCREEN (HOSP PERFORMED) - Abnormal; Notable for the following:    Tetrahydrocannabinol POSITIVE (*)    All other components within normal limits  [name] - Abnormal; Notable for the following:    Alcohol, Ethyl (B) 245 (*)    All other components within normal limits  CK - Abnormal;  Notable for the following:    Total CK 247 (*)    All other components within normal limits  SALICYLATE LEVEL  URINE MICROSCOPIC-ADD ON    Imaging Review No results found.   EKG Interpretation   Date/Time:  Friday June 18 2014 19:02:14 EST Ventricular Rate:  134 PR Interval:  122 QRS Duration: 117 QT Interval:  308 QTC Calculation: 460 R Axis:   43 Text Interpretation:  Sinus tachycardia Incomplete right bundle branch  block Confirmed by  Phillip Maffei  MD, Jissell Trafton ([id number]) on 06/18/2014 7:11:23 PM     CRITICAL CARE Performed by: Donnetta HutchingOOK,Jeury Mcnab Total critical care time: 45 Critical care time was exclusive of separately billable procedures and treating other patients. Critical care was necessary to treat or prevent imminent or life-threatening deterioration. Critical care was time spent personally by me on the following activities: development of treatment plan with patient and/or surrogate as well as nursing, discussions with consultants, evaluation of patient's response to treatment, examination of patient, obtaining history from patient or surrogate, ordering and performing treatments and interventions, ordering and review of laboratory studies, ordering and review of radiographic studies, pulse oximetry and re-evaluation of patient's condition. MDM   Final diagnoses:  Altered mental status  Alcohol intoxication, with delirium    Patient is intoxicated psychotic. He has required intravenous Haldol for psychotic behavior.   He is tachycardic but his blood pressure is normal. Will obtain CT head to rule out any intracranial pathology. Discussed with his father. Admit to general medicine.    Donnetta HutchingBrian Abbey Veith, MD 06/18/14 2250

## 2014-06-18 NOTE — ED Notes (Signed)
No response to narcan

## 2014-06-18 NOTE — ED Notes (Signed)
Pt brought in by [employer].  IVC by dad for aggressive behavior.  Found in home in bed.  Has 3 puncture like wounds to left leg.  Found by [employer] in this manner.  Pt constantly moving around in triage and not responding to direction.  Cannot get verbalization if drugs or meds taken to give Altered mental status.

## 2014-06-18 NOTE — ED Notes (Signed)
Pt aggressive, agitated, lashing out at staff. Pt requiring multiple staff and [employer] officers to prevent pt from hurting self or others.

## 2014-06-19 ENCOUNTER — Inpatient Hospital Stay (HOSPITAL_COMMUNITY): Payer: Self-pay

## 2014-06-19 ENCOUNTER — Encounter (HOSPITAL_COMMUNITY): Payer: Self-pay | Admitting: Internal Medicine

## 2014-06-19 DIAGNOSIS — F1010 Alcohol abuse, uncomplicated: Secondary | ICD-10-CM

## 2014-06-19 DIAGNOSIS — F10231 Alcohol dependence with withdrawal delirium: Secondary | ICD-10-CM

## 2014-06-19 DIAGNOSIS — W540XXA Bitten by dog, initial encounter: Secondary | ICD-10-CM | POA: Diagnosis present

## 2014-06-19 DIAGNOSIS — S81852A Open bite, left lower leg, initial encounter: Secondary | ICD-10-CM

## 2014-06-19 DIAGNOSIS — G9340 Encephalopathy, unspecified: Secondary | ICD-10-CM

## 2014-06-19 LAB — CBC
HEMATOCRIT: 42.9 % (ref 39.0–52.0)
Hemoglobin: 14.4 g/dL (ref 13.0–17.0)
MCH: 33.3 pg (ref 26.0–34.0)
MCHC: 33.6 g/dL (ref 30.0–36.0)
MCV: 99.1 fL (ref 78.0–100.0)
PLATELETS: 230 10*3/uL (ref 150–400)
RBC: 4.33 MIL/uL (ref 4.22–5.81)
RDW: 12.4 % (ref 11.5–15.5)
WBC: 8.9 10*3/uL (ref 4.0–10.5)

## 2014-06-19 LAB — COMPREHENSIVE METABOLIC PANEL
ALT: 29 U/L (ref 0–53)
AST: 98 U/L — ABNORMAL HIGH (ref 0–37)
Albumin: 4.4 g/dL (ref 3.5–5.2)
Alkaline Phosphatase: 62 U/L (ref 39–117)
Anion gap: 9 (ref 5–15)
BUN: 9 mg/dL (ref 6–23)
CALCIUM: 8.3 mg/dL — AB (ref 8.4–10.5)
CHLORIDE: 105 meq/L (ref 96–112)
CO2: 27 mmol/L (ref 19–32)
Creatinine, Ser: 0.9 mg/dL (ref 0.50–1.35)
GFR calc Af Amer: >90 mL/min (ref >90)
GFR calc non Af Amer: >90 mL/min (ref >90)
GLUCOSE: 85 mg/dL (ref 70–99)
POTASSIUM: 3.9 mmol/L (ref 3.5–5.1)
SODIUM: 141 mmol/L (ref 135–145)
Total Bilirubin: 0.9 mg/dL (ref 0.3–1.2)
Total Protein: 6.8 g/dL (ref 6.0–8.3)

## 2014-06-19 LAB — TSH: TSH: 1.46 u[IU]/mL (ref 0.350–4.500)

## 2014-06-19 LAB — MRSA PCR SCREENING: MRSA BY PCR: NEGATIVE

## 2014-06-19 LAB — PHOSPHORUS: Phosphorus: 2.8 mg/dL (ref 2.3–4.6)

## 2014-06-19 LAB — ETHANOL: Alcohol, Ethyl (B): <5 mg/dL (ref 0–9)

## 2014-06-19 LAB — MAGNESIUM: Magnesium: 2.2 mg/dL (ref 1.5–2.5)

## 2014-06-19 MED ORDER — THIAMINE HCL 100 MG/ML IJ SOLN
Freq: Once | INTRAVENOUS | Status: AC
  Administered 2014-06-19: 03:00:00 via INTRAVENOUS
  Filled 2014-06-19: qty 1000

## 2014-06-19 MED ORDER — LORAZEPAM 2 MG/ML IJ SOLN: 2.0000 mg | INTRAMUSCULAR | Status: DC | PRN

## 2014-06-19 MED ORDER — ACETAMINOPHEN 650 MG RE SUPP
650.0000 mg | Freq: Four times a day (QID) | RECTAL | Status: DC | PRN
Start: 2014-06-19 — End: 2014-06-20

## 2014-06-19 MED ORDER — ACETAMINOPHEN 325 MG PO TABS: 650.0000 mg | ORAL_TABLET | Freq: Four times a day (QID) | ORAL | Status: DC | PRN

## 2014-06-19 MED ORDER — FOLIC ACID 5 MG/ML IJ SOLN
1.0000 mg | Freq: Every day | INTRAMUSCULAR | Status: DC
  Administered 2014-06-19 – 2014-06-20 (×2): 1 mg via INTRAVENOUS
  Filled 2014-06-19 (×2): qty 0.2

## 2014-06-19 MED ORDER — AMOXICILLIN-POT CLAVULANATE 875-125 MG PO TABS
1.0000 | ORAL_TABLET | Freq: Two times a day (BID) | ORAL | Status: DC
  Administered 2014-06-19 – 2014-06-20 (×4): 1 via ORAL
  Filled 2014-06-19 (×5): qty 1

## 2014-06-19 MED ORDER — SODIUM CHLORIDE 0.9 % IV SOLN: INTRAVENOUS | Status: DC

## 2014-06-19 MED ORDER — THIAMINE HCL 100 MG/ML IJ SOLN
100.0000 mg | Freq: Every day | INTRAMUSCULAR | Status: DC
  Administered 2014-06-19 – 2014-06-20 (×2): 100 mg via INTRAVENOUS
  Filled 2014-06-19: qty 1
  Filled 2014-06-19: qty 2

## 2014-06-19 MED ORDER — ONDANSETRON HCL 4 MG/2ML IJ SOLN: 4.0000 mg | Freq: Four times a day (QID) | INTRAMUSCULAR | Status: DC | PRN

## 2014-06-19 MED ORDER — SODIUM CHLORIDE 0.9 % IJ SOLN
3.0000 mL | Freq: Two times a day (BID) | INTRAMUSCULAR | Status: DC
  Administered 2014-06-19 – 2014-06-20 (×2): 3 mL via INTRAVENOUS

## 2014-06-19 MED ORDER — ONDANSETRON HCL 4 MG PO TABS: 4.0000 mg | ORAL_TABLET | Freq: Four times a day (QID) | ORAL | Status: DC | PRN

## 2014-06-19 MED ORDER — LORAZEPAM 1 MG PO TABS: 2.0000 mg | ORAL_TABLET | ORAL | Status: DC | PRN

## 2014-06-19 MED ORDER — TRAMADOL HCL 50 MG PO TABS
50.0000 mg | ORAL_TABLET | Freq: Four times a day (QID) | ORAL | Status: DC | PRN
  Administered 2014-06-19 – 2014-06-20 (×3): 50 mg via ORAL
  Filled 2014-06-19 (×3): qty 1

## 2014-06-19 NOTE — ED Notes (Signed)
Pt is alert and oriented. Restraints removed after pt agreed he would not become violent. Pt states he is tired and would just like to sleep.

## 2014-06-19 NOTE — H&P (Signed)
PCP: No PCP Per Patient    Chief Complaint: Aggressive behavior  HPI: Adam Novak is a 24 y.o. male   has no past medical history on file.   Presented with  Patient was brought in by police. Per records he has been IVC by his father for being aggressive he was found at home in bed with 3 puncture wounds to his legs. Patient was very agitated requiring 4 point restraints. Patient was given Narcan with apparently no response. Per father he has a serious drinking problemU tox positive for marijuana. After 5 hours in emergency department patient started to be aware I alert and responsive. He stated that he drank too much and was remorseful. When asked how much this he drinks an regular basis he states just a few drinks. Of note from a past history it was mentioned that both his parents a heavy alcoholics and his father continues to drink currently. Per nursing staff while father was in emergency department he appeared to be intoxicated. Patient at this point reports no suicidal ideations. He explains the puncture marks as is his dog bit him.  At this point patient expresses desire to stop drinking. He denies history of withdrawal. But already tremulous. Hospitalist was called for admission for aqua home intoxication the delirium.   Review of Systems:    Pertinent positives include: confusion  Constitutional:  No weight loss, night sweats, Fevers, chills, fatigue, weight loss  HEENT:  No headaches, Difficulty swallowing,Tooth/dental problems,Sore throat,  No sneezing, itching, ear ache, nasal congestion, post nasal drip,  Cardio-vascular:  No chest pain, Orthopnea, PND, anasarca, dizziness, palpitations.no Bilateral lower extremity swelling  GI:  No heartburn, indigestion, abdominal pain, nausea, vomiting, diarrhea, change in bowel habits, loss of appetite, melena, blood in stool, hematemesis Resp:  no shortness of breath at rest. No dyspnea on exertion, No excess mucus, no  productive cough, No non-productive cough, No coughing up of blood.No change in color of mucus.No wheezing. Skin:  no rash or lesions. No jaundice GU:  no dysuria, change in color of urine, no urgency or frequency. No straining to urinate.  No flank pain.  Musculoskeletal:  No joint pain or no joint swelling. No decreased range of motion. No back pain.  Psych:  No change in mood or affect. No depression or anxiety. No memory loss.  Neuro: no localizing neurological complaints, no tingling, no weakness, no double vision, no gait abnormality, no slurred speech, no confusion  Otherwise ROS are negative except for above, 10 systems were reviewed  Past Medical History: History reviewed. No pertinent past medical history. Past Surgical History  Procedure Laterality Date  . Knee surgery Right      Medications: Prior to Admission medications   Medication Sig Start Date End Date Taking? Authorizing Provider  cephALEXin (KEFLEX) 500 MG capsule Take 1 capsule (500 mg total) by mouth 4 (four) times daily. 01/10/14   Nicole Pisciotta, PA-C  ibuprofen (ADVIL,MOTRIN) 200 MG tablet Take 600 mg by mouth once.    Historical Provider, MD  traMADol (ULTRAM) 50 MG tablet Take 1 tablet (50 mg total) by mouth every 6 (six) hours as needed. 01/10/14   Joni Reining Pisciotta, PA-C    Allergies:  No Known Allergies  Social History:  Ambulatory independently  Lives at home  With family father Fanny Bien home phone number [phone] cell phone number [phone] unable to contact the need to try again in the morning   reports that he has been smoking.  He  does not have any smokeless tobacco history on file. He reports that he drinks alcohol. He reports that he uses illicit drugs (Marijuana).    Family History: family history is not on file.    Physical Exam: Patient Vitals for the past 24 hrs:  BP Temp Temp src Pulse Resp SpO2  06/18/14 2330 128/67 mmHg - - 101 12 98 %  06/18/14 2300 (!) 117/48  mmHg - - 108 19 96 %  06/18/14 2230 (!) 114/52 mmHg - - 111 24 96 %  06/18/14 2215 (!) 119/45 mmHg - - 110 13 97 %  06/18/14 2200 (!) 121/47 mmHg - - 114 24 97 %  06/18/14 2130 (!) 120/52 mmHg - - 113 17 96 %  06/18/14 2100 (!) 121/46 mmHg - - 113 17 97 %  06/18/14 2030 (!) 122/49 mmHg - - 113 17 96 %  06/18/14 2000 130/56 mmHg - - 118 (!) 32 98 %  06/18/14 1930 (!) 122/49 mmHg - - 112 19 95 %  06/18/14 1903 142/71 mmHg - - (!) 130 22 100 %  06/18/14 1826 (!) 103/50 mmHg 98.1 F (36.7 C) Oral 112 16 99 %    1. General:  in No Acute distress 2. Psychological: Alert and Oriented 3. Head/ENT:   Dry Mucous Membranes                          Head Non traumatic, neck supple                          Normal  Dentition 4. SKIN: normal Skin turgor,  Skin clean Dry and intact no rash numerous bruises and scratches and abrasions on Wounds noted on left calf 5. Heart: Regular rate and rhythm no Murmur, Rub or gallop 6. Lungs: Clear to auscultation bilaterally, no wheezes or crackles   7. Abdomen: Soft, non-tender, Non distended 8. Lower extremities: no clubbing, cyanosis, or edema 9. Neurologically Grossly intact, moving all 4 extremities equally tremulous 10. MSK: Normal range of motion  body mass index is unknown because there is no height or weight on file.   Labs on Admission:   Results for orders placed or performed during the hospital encounter of 06/18/14 (from the past 24 hour(s))  CBC with Differential     Status: Abnormal   Collection Time: 06/18/14  6:35 PM  Result Value Ref Range   WBC 14.3 (H) 4.0 - 10.5 K/uL   RBC 4.53 4.22 - 5.81 MIL/uL   Hemoglobin 14.9 13.0 - 17.0 g/dL   HCT 95.6 21.3 - 08.6 %   MCV 98.0 78.0 - 100.0 fL   MCH 32.9 26.0 - 34.0 pg   MCHC 33.6 30.0 - 36.0 g/dL   RDW 57.8 46.9 - 62.9 %   Platelets 242 150 - 400 K/uL   Neutrophils Relative % 57 43 - 77 %   Neutro Abs 8.3 (H) 1.7 - 7.7 K/uL   Lymphocytes Relative 35 12 - 46 %   Lymphs Abs 5.0 (H) 0.7 -  4.0 K/uL   Monocytes Relative 7 3 - 12 %   Monocytes Absolute 1.0 0.1 - 1.0 K/uL   Eosinophils Relative 1 0 - 5 %   Eosinophils Absolute 0.1 0.0 - 0.7 K/uL   Basophils Relative 0 0 - 1 %   Basophils Absolute 0.0 0.0 - 0.1 K/uL  Comprehensive metabolic panel     Status: Abnormal  Collection Time: 06/18/14  6:35 PM  Result Value Ref Range   Sodium 141 135 - 145 mmol/L   Potassium 4.2 3.5 - 5.1 mmol/L   Chloride 102 96 - 112 mEq/L   CO2 26 19 - 32 mmol/L   Glucose, Bld 122 (H) 70 - 99 mg/dL   BUN 12 6 - 23 mg/dL   Creatinine, Ser 6.04 0.50 - 1.35 mg/dL   Calcium 9.3 8.4 - 54.0 mg/dL   Total Protein 8.3 6.0 - 8.3 g/dL   Albumin 5.0 3.5 - 5.2 g/dL   AST 32 0 - 37 U/L   ALT 21 0 - 53 U/L   Alkaline Phosphatase 69 39 - 117 U/L   Total Bilirubin 0.8 0.3 - 1.2 mg/dL   GFR calc non Af Amer >90 >90 mL/min   GFR calc Af Amer >90 >90 mL/min   Anion gap 13 5 - 15  CK     Status: Abnormal   Collection Time: 06/18/14  6:35 PM  Result Value Ref Range   Total CK 247 (H) 7 - 232 U/L  Acetaminophen level     Status: Abnormal   Collection Time: 06/18/14  6:37 PM  Result Value Ref Range   Acetaminophen (Tylenol), Serum <10.0 (L) 10 - 30 ug/mL  Salicylate level     Status: None   Collection Time: 06/18/14  6:37 PM  Result Value Ref Range   Salicylate Lvl <4.0 2.8 - 20.0 mg/dL  [name]     Status: Abnormal   Collection Time: 06/18/14  6:37 PM  Result Value Ref Range   Alcohol, Ethyl (B) 245 (H) 0 - 9 mg/dL  Urinalysis, Routine w reflex microscopic     Status: Abnormal   Collection Time: 06/18/14  8:13 PM  Result Value Ref Range   Color, Urine YELLOW YELLOW   APPearance CLOUDY (A) CLEAR   Specific Gravity, Urine 1.003 (L) 1.005 - 1.030   pH 6.5 5.0 - 8.0   Glucose, UA NEGATIVE NEGATIVE mg/dL   Hgb urine dipstick TRACE (A) NEGATIVE   Bilirubin Urine NEGATIVE NEGATIVE   Ketones, ur NEGATIVE NEGATIVE mg/dL   Protein, ur NEGATIVE NEGATIVE mg/dL   Urobilinogen, UA 0.2 0.0 - 1.0 mg/dL    Nitrite NEGATIVE NEGATIVE   Leukocytes, UA NEGATIVE NEGATIVE  Drug screen panel, emergency     Status: Abnormal   Collection Time: 06/18/14  8:13 PM  Result Value Ref Range   Opiates NONE DETECTED NONE DETECTED   Cocaine NONE DETECTED NONE DETECTED   Benzodiazepines NONE DETECTED NONE DETECTED   Amphetamines NONE DETECTED NONE DETECTED   Tetrahydrocannabinol POSITIVE (A) NONE DETECTED   Barbiturates NONE DETECTED NONE DETECTED  Urine microscopic-add on     Status: None   Collection Time: 06/18/14  8:13 PM  Result Value Ref Range   Squamous Epithelial / LPF RARE RARE   RBC / HPF 0-2 <3 RBC/hpf   Bacteria, UA RARE RARE    UA no evidence of UTI  No results found for: HGBA1C  CrCl cannot be calculated (Unknown ideal weight.).  BNP (last 3 results) No results for input(s): PROBNP in the last 8760 hours.  Other results:  I have pearsonaly reviewed this: ECG REPORT  Rate: 134  Rhythm: Sinus tachycardia with right bundle branch block and complete ST&T Change: No ischemic changes   There were no vitals filed for this visit.   Cultures:    Component Value Date/Time   SDES ABSCESS GROIN RIGHT 11/10/2013 1121  SPECREQUEST NONE 11/10/2013 1121   CULT  11/10/2013 1121    NO GROWTH 3 DAYS [facility]   REPTSTATUS 11/13/2013 FINAL 11/10/2013 1121     Radiological Exams on Admission: No results found.  Chart has been reviewed  Assessment/Plan  24 year old gentleman with history of alcohol abuse. In prior IVC admissions due to unsafe behavior while intoxicated here with severe agitation currently appears to be back to baseline denies suicidal ideations at risk of alcohol withdrawal will monitor and step down    Present on Admission:  . Alcohol abuse - patient this point interested stopping drinking. He is at risk of withdrawal. We'll observe and step down CIWA . Alcohol withdrawal - CIWAprotocol . Encephalopathy - currently improved was in the  setting of acute alcohol intoxication. We'll monitor for withdrawal  . Dog bite of left calf - Augmentin, wound care by ER   Prophylaxis: scd  CODE STATUS:  FULL CODE   Other plan as per orders.  I have spent a total of 65 min on this admissionextra time was taken to reassess the patient as he have had change in mental status   Letisia Schwalb 06/19/2014, 12:17 AM  [facility]  Pager [phone]   after 2 AM please page floor coverage PA If 7AM-7PM, please contact the day team taking care of the patient  [email or web address]  Password [email or web address]

## 2014-06-19 NOTE — ED Notes (Signed)
Pt is alert and oriented.
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
## 2014-06-19 NOTE — Progress Notes (Signed)
Tried calling family at the number provided in the chart but unable to leave message and no one answered. Pt apparently IVC but stable now for d/c. Debbora PrestoMAGICK-Saniah Schroeter, MD  [facility] Pager [phone]  If 7PM-7AM, please contact night-coverage [email or web address] Password [email or web address]

## 2014-06-19 NOTE — ED Notes (Signed)
Per MD pt needs CT results before taken to floor. Report called to floor.

## 2014-06-20 DIAGNOSIS — F10229 Alcohol dependence with intoxication, unspecified: Secondary | ICD-10-CM

## 2014-06-20 DIAGNOSIS — Y909 Presence of alcohol in blood, level not specified: Secondary | ICD-10-CM

## 2014-06-20 DIAGNOSIS — F1994 Other psychoactive substance use, unspecified with psychoactive substance-induced mood disorder: Secondary | ICD-10-CM

## 2014-06-20 LAB — CBC
HEMATOCRIT: 40.8 % (ref 39.0–52.0)
Hemoglobin: 13.8 g/dL (ref 13.0–17.0)
MCH: 33.5 pg (ref 26.0–34.0)
MCHC: 33.8 g/dL (ref 30.0–36.0)
MCV: 99 fL (ref 78.0–100.0)
PLATELETS: 206 10*3/uL (ref 150–400)
RBC: 4.12 MIL/uL — ABNORMAL LOW (ref 4.22–5.81)
RDW: 12.4 % (ref 11.5–15.5)
WBC: 6.6 10*3/uL (ref 4.0–10.5)

## 2014-06-20 LAB — COMPREHENSIVE METABOLIC PANEL
ALBUMIN: 4.2 g/dL (ref 3.5–5.2)
ALK PHOS: 61 U/L (ref 39–117)
ALT: 33 U/L (ref 0–53)
ANION GAP: 6 (ref 5–15)
AST: 97 U/L — ABNORMAL HIGH (ref 0–37)
BUN: 11 mg/dL (ref 6–23)
CO2: 26 mmol/L (ref 19–32)
Calcium: 8.3 mg/dL — ABNORMAL LOW (ref 8.4–10.5)
Chloride: 99 mEq/L (ref 96–112)
Creatinine, Ser: 0.82 mg/dL (ref 0.50–1.35)
GFR calc Af Amer: >90 mL/min (ref >90)
GLUCOSE: 92 mg/dL (ref 70–99)
Potassium: 3.8 mmol/L (ref 3.5–5.1)
Sodium: 131 mmol/L — ABNORMAL LOW (ref 135–145)
Total Bilirubin: 1.1 mg/dL (ref 0.3–1.2)
Total Protein: 6.7 g/dL (ref 6.0–8.3)

## 2014-06-20 MED ORDER — VITAMIN B-1 100 MG PO TABS: 100.0000 mg | ORAL_TABLET | Freq: Every day | ORAL | Status: DC

## 2014-06-20 MED ORDER — FOLIC ACID 1 MG PO TABS: 1.0000 mg | ORAL_TABLET | Freq: Every day | ORAL | Status: DC

## 2014-06-20 NOTE — Progress Notes (Signed)
Key Points: Use following P&T approved IV to PO non-antibiotic change policy.  Description contains the criteria that are approved Note: Policy Excludes:  Esophagectomy patientsPHARMACIST - PHYSICIAN COMMUNICATION DR:   Izola PriceMyers CONCERNING: IV to Oral Route Change Policy  RECOMMENDATION: This patient is receiving folic acid and thiamine by the intravenous route.  Based on criteria approved by the Pharmacy and Therapeutics Committee, the intravenous medication(s) is/are being converted to the equivalent oral dose form(s).   DESCRIPTION: These criteria include:  The patient is eating (either orally or via tube) and/or has been taking other orally administered medications for a least 24 hours  The patient has no evidence of active gastrointestinal bleeding or impaired GI absorption (gastrectomy, short bowel, patient on TNA or NPO).  If you have questions about this conversion, please contact the Pharmacy Department  []   [phone]( [phone] )  [facility] []   [phone]( [phone] )  [facility]  []   [phone]( [phone] )  [facility] [x]   [phone]( [phone] )  [facility]  Earl ManyLegge, Konnor Jorden PetersburgMarshall, [facility]PH 06/20/2014 11:11 AM

## 2014-06-20 NOTE — Discharge Summary (Signed)
Physician Discharge Summary  Adam Novak NWG:[id number] DOB: June 30, 1990 DOA: 06/18/2014  PCP: No PCP Per Patient  Admit date: 06/18/2014 Discharge date: 06/20/2014  Recommendations for Outpatient Follow-up:  1. Pt will need to follow up with PCP in 2-3 weeks post discharge 2. Outpatient psych follow up recommended per psych team, no need for inpatient psych management   Discharge Diagnoses:  Active Problems:   Alcohol abuse   Alcohol withdrawal   Alcohol intoxication, episodic   Encephalopathy   Dog bite of left calf    Discharge Condition: Stable  Diet recommendation: Heart healthy diet discussed in details   History of present illness:  Patient was brought in by police. Per records he has been IVC by his father for being aggressive he was found at home in bed with 3 puncture wounds to his legs. Patient was very agitated requiring 4 point restraints. Patient was given Narcan with apparently no response. Per father he has a serious drinking problem, U tox positive for marijuana. After 5 hours in emergency department patient started to be aware, alert and responsive. He stated that he drank too much and was remorseful. When asked how much this he drinks an regular basis he states just a few drinks. Of note from a past history it was mentioned that both his parents are heavy alcoholics and his father continues to drink currently.   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 Noncooperative and aggressive. Altered mental status. ETOH on board with  suicidal intentions.  EXAM: CT HEAD WITHOUT CONTRAST  TECHNIQUE: Contiguous axial images were obtained from the base of the skull through the vertex without intravenous contrast.  COMPARISON:  None.  FINDINGS: Ventricles and sulci appear symmetrical. Ventricles are not dilated. No mass effect or midline shift. No abnormal extra-axial fluid collections. Gray-white matter junctions are distinct. Basal cisterns are not effaced. No evidence of acute intracranial hemorrhage. No depressed skull fractures. Mild mucosal thickening in the right maxillary antrum. Remaining paranasal sinuses and mastoid air cells are not opacified.  IMPRESSION: No acute intracranial abnormalities. Mild inflammatory changes in the paranasal sinuses.   Electronically Signed   By: Burman Nieves M.D.   On: 06/19/2014 00:33   Portable Chest 1 View  06/19/2014   CLINICAL DATA:  Leukocytosis.  Initial encounter.  EXAM: PORTABLE CHEST - 1 VIEW  COMPARISON:  None.  FINDINGS: The lungs are well-aerated and clear. There is no evidence of focal opacification, pleural effusion or pneumothorax.  The cardiomediastinal silhouette is within normal limits. No acute osseous abnormalities are seen.  IMPRESSION: No acute cardiopulmonary process seen. No displaced rib fractures identified.   Electronically Signed   By: Roanna Raider M.D.   On: 06/19/2014 07:12   Discharge Exam: Filed Vitals:   06/20/14 0527  BP: 127/98  Pulse: 104  Temp: 98.2 F (36.8 C)  Resp: 20   Filed Vitals:   06/19/14 1148 06/19/14 1401 06/19/14 2156 06/20/14 0527  BP:  139/90 128/104 127/98  Pulse:  96 98 104  Temp: 97.8 F (36.6  C) 98.1 F (36.7 C) 98 F (36.7 C) 98.2 F (36.8 C)  TempSrc: Oral Oral Oral Oral  Resp:  18 18 20   Height:  5\' 9"  (1.753 m)    Weight:      SpO2:  98% 99% 98%    General: Pt is alert, follows commands appropriately, not in acute distress Cardiovascular: Regular rate and rhythm, S1/S2 +, no murmurs, no rubs, no gallops Respiratory:  Clear to auscultation bilaterally, no wheezing, no crackles, no rhonchi Abdominal: Soft, non tender, non distended, bowel sounds +, no guarding Extremities: no edema, no cyanosis, pulses palpable bilaterally DP and PT Neuro: Grossly nonfocal  Discharge Instructions  Discharge Instructions    Diet - low sodium heart healthy    Complete by:  As directed      Increase activity slowly    Complete by:  As directed             Medication List    TAKE these medications        cephALEXin 500 MG capsule  Commonly known as:  KEFLEX  Take 1 capsule (500 mg total) by mouth 4 (four) times daily.     traMADol 50 MG tablet  Commonly known as:  ULTRAM  Take 1 tablet (50 mg total) by mouth every 6 (six) hours as needed.           Follow-up Information    Follow up with Debbora PrestoMAGICK-Debra Calabretta, MD.   Specialty:  Internal Medicine   Why:  As needed   Contact information:   [address] [place] [address] [phone]        The results of significant diagnostics from this hospitalization (including imaging, microbiology, ancillary and laboratory) are listed below for reference.     Microbiology: Recent Results (from the past 240 hour(s))  MRSA PCR Screening     Status: None   Collection Time: 06/19/14  1:49 AM  Result Value Ref Range Status   MRSA by PCR NEGATIVE NEGATIVE Final    Comment:        The GeneXpert MRSA Assay (FDA approved for NASAL specimens only), is one component of a comprehensive MRSA colonization surveillance program. It is not intended to diagnose MRSA infection nor to guide or monitor treatment for MRSA infections.      Labs: Basic Metabolic Panel:  Recent Labs Lab 06/18/14 1835 06/19/14 0342 06/20/14 0513  NA 141 141 131*  K 4.2 3.9 3.8  CL 102 105 99  CO2 26 27 26   GLUCOSE 122* 85 92  BUN 12 9 11   CREATININE 0.98 0.90 0.82  CALCIUM 9.3 8.3* 8.3*  MG  --  2.2  --   PHOS  --  2.8  --    Liver Function  Tests:  Recent Labs Lab 06/18/14 1835 06/19/14 0342 06/20/14 0513  AST 32 98* 97*  ALT 21 29 33  ALKPHOS 69 62 61  BILITOT 0.8 0.9 1.1  PROT 8.3 6.8 6.7  ALBUMIN 5.0 4.4 4.2   CBC:  Recent Labs Lab 06/18/14 1835 06/19/14 0342 06/20/14 0513  WBC 14.3* 8.9 6.6  NEUTROABS 8.3*  --   --   HGB 14.9 14.4 13.8  HCT 44.4 42.9 40.8  MCV 98.0 99.1 99.0  PLT 242 230 206   Cardiac Enzymes:  Recent Labs Lab 06/18/14 1835  CKTOTAL 247*   BNP: BNP (last 3 results) No results for input(s): PROBNP in the last 8760 hours. CBG: No results for input(s):  GLUCAP in the last 168 hours.   SIGNED: Time coordinating discharge: Over 30 minutes  Debbora Presto, MD  [facility] 06/20/2014, 12:58 PM Pager [phone]  If 7PM-7AM, please contact night-coverage [email or web address] Password [email or web address]

## 2014-06-20 NOTE — Consult Note (Signed)
[facility] Consult   Reason for Consult:  Alcohol intoxication and cannabis abuse Referring Physician:  Dr. [name] is an 24 y.o. male. Total Time spent with patient: 45 minutes  Assessment: AXIS I:  Substance Abuse, Substance Induced Mood Disorder and Alcohol intoxication and dependence AXIS II:  Deferred AXIS III:  History reviewed. No pertinent past medical history. AXIS IV:  other psychosocial or environmental problems, problems related to social environment and problems with primary support group AXIS V:  41-50 serious symptoms  Plan:  No evidence of imminent risk to self or others at present.   Patient does not meet criteria for psychiatric inpatient admission. Supportive therapy provided about ongoing stressors. Discussed crisis plan, support from social network, calling 911, coming to the Emergency Department, and calling Suicide Hotline. Referred to the outpatient psychiatric services and substance abuse treatment  Subjective:   Adam Novak is a 24 y.o. male patient admitted with Alcohol intoxication and cannabis abuse.  HPI:  Adam Novak is a 24 y.o. male seen and chart reviewed for psychiatric consultation evaluation of substance abuse versus dependence. Patient stated that he had two 40 ounces of beer and had an argument with his father, who completed involuntary commitment for aggressive behaviors while intoxicated. Patient stated he started drinking when he was 24 years old, recently drinking 5 days a week. Patient also reported smoking marijuana. Patient denies current alcohol withdrawal symptoms and denied delirium tremens and withdrawal seizures. Patient has 1 DWI and has a court date. Patient wants to find a job to make money to pay for his DWI.  Patient reported he received one previous detox treatment at behavioral [place] but does not feel like he needs rehabilitation treatment at this time. Patient is not willing to  participate in rehabilitation unless it is mandatory. Patient also stated that his father is disabled and alcoholic he need more substance abuse treatment then himself. Patient has a brother is currently homeless secondary to polysubstance abuse versus dependence. He has 2 sisters who has been free from substance abuse. Patient denies current symptoms of depression, anxiety, psychosis, denies current suicidal, homicidal ideation, intention or plans. At this point patient expresses desire to stop drinking but does not want to go for a the detox or rehabilitation treatments. He denies history of withdrawal.   HPI Elements:   Location:  Substance abuse. Quality:  Agitation argument. Severity:  DWI. Timing:  Nonspecific. Duration:  About a year. Context:  Psychosocial stresses.  Past Psychiatric History: History reviewed. No pertinent past medical history.  reports that he has been smoking.  He does not have any smokeless tobacco history on file. He reports that he drinks alcohol. He reports that he uses illicit drugs (Marijuana). History reviewed. No pertinent family history.   Living Arrangements: Parent   Abuse/Neglect [facility]) Physical Abuse: Denies Verbal Abuse: Denies Sexual Abuse: Denies Allergies:  No Known Allergies  ACT Assessment Complete:  NO Objective: Blood pressure 127/98, pulse 104, temperature 98.2 F (36.8 C), temperature source Oral, resp. rate 20, height $RemoveBe'5\' 9"'CgrrXzriY$  (1.753 m), weight 83.5 kg (184 lb 1.4 oz), SpO2 98 %.Body mass index is 27.17 kg/(m^2). Results for orders placed or performed during the hospital encounter of 06/18/14 (from the past 72 hour(s))  CBC with Differential     Status: Abnormal   Collection Time: 06/18/14  6:35 PM  Result Value Ref Range   WBC 14.3 (H) 4.0 - 10.5 K/uL   RBC 4.53 4.22 -  5.81 MIL/uL   Hemoglobin 14.9 13.0 - 17.0 g/dL   HCT 44.4 39.0 - 52.0 %   MCV 98.0 78.0 - 100.0 fL   MCH 32.9 26.0 - 34.0 pg   MCHC 33.6 30.0 - 36.0 g/dL   RDW 12.3  11.5 - 15.5 %   Platelets 242 150 - 400 K/uL   Neutrophils Relative % 57 43 - 77 %   Neutro Abs 8.3 (H) 1.7 - 7.7 K/uL   Lymphocytes Relative 35 12 - 46 %   Lymphs Abs 5.0 (H) 0.7 - 4.0 K/uL   Monocytes Relative 7 3 - 12 %   Monocytes Absolute 1.0 0.1 - 1.0 K/uL   Eosinophils Relative 1 0 - 5 %   Eosinophils Absolute 0.1 0.0 - 0.7 K/uL   Basophils Relative 0 0 - 1 %   Basophils Absolute 0.0 0.0 - 0.1 K/uL  Comprehensive metabolic panel     Status: Abnormal   Collection Time: 06/18/14  6:35 PM  Result Value Ref Range   Sodium 141 135 - 145 mmol/L    Comment: Please note change in reference range.   Potassium 4.2 3.5 - 5.1 mmol/L    Comment: Please note change in reference range.   Chloride 102 96 - 112 mEq/L   CO2 26 19 - 32 mmol/L   Glucose, Bld 122 (H) 70 - 99 mg/dL   BUN 12 6 - 23 mg/dL   Creatinine, Ser 0.98 0.50 - 1.35 mg/dL   Calcium 9.3 8.4 - 10.5 mg/dL   Total Protein 8.3 6.0 - 8.3 g/dL   Albumin 5.0 3.5 - 5.2 g/dL   AST 32 0 - 37 U/L   ALT 21 0 - 53 U/L   Alkaline Phosphatase 69 39 - 117 U/L   Total Bilirubin 0.8 0.3 - 1.2 mg/dL   GFR calc non Af Amer >90 >90 mL/min   GFR calc Af Amer >90 >90 mL/min    Comment: (NOTE) The eGFR has been calculated using the CKD EPI equation. This calculation has not been validated in all clinical situations. eGFR's persistently <90 mL/min signify possible Chronic Kidney Disease.    Anion gap 13 5 - 15  CK     Status: Abnormal   Collection Time: 06/18/14  6:35 PM  Result Value Ref Range   Total CK 247 (H) 7 - 232 U/L  Acetaminophen level     Status: Abnormal   Collection Time: 06/18/14  6:37 PM  Result Value Ref Range   Acetaminophen (Tylenol), Serum <10.0 (L) 10 - 30 ug/mL    Comment:        THERAPEUTIC CONCENTRATIONS VARY SIGNIFICANTLY. A RANGE OF 10-30 ug/mL MAY BE AN EFFECTIVE CONCENTRATION FOR MANY PATIENTS. HOWEVER, SOME ARE BEST TREATED AT CONCENTRATIONS OUTSIDE THIS RANGE. ACETAMINOPHEN CONCENTRATIONS >150 ug/mL  AT 4 HOURS AFTER INGESTION AND >50 ug/mL AT 12 HOURS AFTER INGESTION ARE OFTEN ASSOCIATED WITH TOXIC REACTIONS.   Salicylate level     Status: None   Collection Time: 06/18/14  6:37 PM  Result Value Ref Range   Salicylate Lvl <0.2 2.8 - 20.0 mg/dL  [name]     Status: Abnormal   Collection Time: 06/18/14  6:37 PM  Result Value Ref Range   Alcohol, Ethyl (B) 245 (H) 0 - 9 mg/dL    Comment:        LOWEST DETECTABLE LIMIT FOR SERUM ALCOHOL IS 11 mg/dL FOR MEDICAL PURPOSES ONLY   Urinalysis, Routine w reflex microscopic  Status: Abnormal   Collection Time: 06/18/14  8:13 PM  Result Value Ref Range   Color, Urine YELLOW YELLOW   APPearance CLOUDY (A) CLEAR   Specific Gravity, Urine 1.003 (L) 1.005 - 1.030   pH 6.5 5.0 - 8.0   Glucose, UA NEGATIVE NEGATIVE mg/dL   Hgb urine dipstick TRACE (A) NEGATIVE   Bilirubin Urine NEGATIVE NEGATIVE   Ketones, ur NEGATIVE NEGATIVE mg/dL   Protein, ur NEGATIVE NEGATIVE mg/dL   Urobilinogen, UA 0.2 0.0 - 1.0 mg/dL   Nitrite NEGATIVE NEGATIVE   Leukocytes, UA NEGATIVE NEGATIVE  Drug screen panel, emergency     Status: Abnormal   Collection Time: 06/18/14  8:13 PM  Result Value Ref Range   Opiates NONE DETECTED NONE DETECTED   Cocaine NONE DETECTED NONE DETECTED   Benzodiazepines NONE DETECTED NONE DETECTED   Amphetamines NONE DETECTED NONE DETECTED   Tetrahydrocannabinol POSITIVE (A) NONE DETECTED   Barbiturates NONE DETECTED NONE DETECTED    Comment:        DRUG SCREEN FOR MEDICAL PURPOSES ONLY.  IF CONFIRMATION IS NEEDED FOR ANY PURPOSE, NOTIFY LAB WITHIN 5 DAYS.        LOWEST DETECTABLE LIMITS FOR URINE DRUG SCREEN Drug Class       Cutoff (ng/mL) Amphetamine      1000 Barbiturate      200 Benzodiazepine   403 Tricyclics       474 Opiates          300 Cocaine          300 THC              50   Urine microscopic-add on     Status: None   Collection Time: 06/18/14  8:13 PM  Result Value Ref Range   Squamous Epithelial /  LPF RARE RARE   RBC / HPF 0-2 <3 RBC/hpf   Bacteria, UA RARE RARE  MRSA PCR Screening     Status: None   Collection Time: 06/19/14  1:49 AM  Result Value Ref Range   MRSA by PCR NEGATIVE NEGATIVE    Comment:        The GeneXpert MRSA Assay (FDA approved for NASAL specimens only), is one component of a comprehensive MRSA colonization surveillance program. It is not intended to diagnose MRSA infection nor to guide or monitor treatment for MRSA infections.   Magnesium     Status: None   Collection Time: 06/19/14  3:42 AM  Result Value Ref Range   Magnesium 2.2 1.5 - 2.5 mg/dL  Phosphorus     Status: None   Collection Time: 06/19/14  3:42 AM  Result Value Ref Range   Phosphorus 2.8 2.3 - 4.6 mg/dL  TSH     Status: None   Collection Time: 06/19/14  3:42 AM  Result Value Ref Range   TSH 1.460 0.350 - 4.500 uIU/mL    Comment: Performed at [facility]  Comprehensive metabolic panel     Status: Abnormal   Collection Time: 06/19/14  3:42 AM  Result Value Ref Range   Sodium 141 135 - 145 mmol/L    Comment: Please note change in reference range.   Potassium 3.9 3.5 - 5.1 mmol/L    Comment: Please note change in reference range.   Chloride 105 96 - 112 mEq/L   CO2 27 19 - 32 mmol/L   Glucose, Bld 85 70 - 99 mg/dL   BUN 9 6 - 23 mg/dL   Creatinine, Ser  0.90 0.50 - 1.35 mg/dL   Calcium 8.3 (L) 8.4 - 10.5 mg/dL   Total Protein 6.8 6.0 - 8.3 g/dL   Albumin 4.4 3.5 - 5.2 g/dL   AST 98 (H) 0 - 37 U/L   ALT 29 0 - 53 U/L   Alkaline Phosphatase 62 39 - 117 U/L   Total Bilirubin 0.9 0.3 - 1.2 mg/dL   GFR calc non Af Amer >90 >90 mL/min   GFR calc Af Amer >90 >90 mL/min    Comment: (NOTE) The eGFR has been calculated using the CKD EPI equation. This calculation has not been validated in all clinical situations. eGFR's persistently <90 mL/min signify possible Chronic Kidney Disease.    Anion gap 9 5 - 15  CBC     Status: None   Collection Time: 06/19/14  3:42 AM  Result  Value Ref Range   WBC 8.9 4.0 - 10.5 K/uL   RBC 4.33 4.22 - 5.81 MIL/uL   Hemoglobin 14.4 13.0 - 17.0 g/dL   HCT 42.9 39.0 - 52.0 %   MCV 99.1 78.0 - 100.0 fL   MCH 33.3 26.0 - 34.0 pg   MCHC 33.6 30.0 - 36.0 g/dL   RDW 12.4 11.5 - 15.5 %   Platelets 230 150 - 400 K/uL  [name]     Status: None   Collection Time: 06/19/14  1:20 PM  Result Value Ref Range   Alcohol, Ethyl (B) <5 0 - 9 mg/dL    Comment:        LOWEST DETECTABLE LIMIT FOR SERUM ALCOHOL IS 11 mg/dL FOR MEDICAL PURPOSES ONLY   Comprehensive metabolic panel     Status: Abnormal   Collection Time: 06/20/14  5:13 AM  Result Value Ref Range   Sodium 131 (L) 135 - 145 mmol/L    Comment: Please note change in reference range. DELTA CHECK NOTED    Potassium 3.8 3.5 - 5.1 mmol/L    Comment: Please note change in reference range.   Chloride 99 96 - 112 mEq/L   CO2 26 19 - 32 mmol/L   Glucose, Bld 92 70 - 99 mg/dL   BUN 11 6 - 23 mg/dL   Creatinine, Ser 0.82 0.50 - 1.35 mg/dL   Calcium 8.3 (L) 8.4 - 10.5 mg/dL   Total Protein 6.7 6.0 - 8.3 g/dL   Albumin 4.2 3.5 - 5.2 g/dL   AST 97 (H) 0 - 37 U/L   ALT 33 0 - 53 U/L   Alkaline Phosphatase 61 39 - 117 U/L   Total Bilirubin 1.1 0.3 - 1.2 mg/dL   GFR calc non Af Amer >90 >90 mL/min   GFR calc Af Amer >90 >90 mL/min    Comment: (NOTE) The eGFR has been calculated using the CKD EPI equation. This calculation has not been validated in all clinical situations. eGFR's persistently <90 mL/min signify possible Chronic Kidney Disease.    Anion gap 6 5 - 15  CBC     Status: Abnormal   Collection Time: 06/20/14  5:13 AM  Result Value Ref Range   WBC 6.6 4.0 - 10.5 K/uL   RBC 4.12 (L) 4.22 - 5.81 MIL/uL   Hemoglobin 13.8 13.0 - 17.0 g/dL   HCT 40.8 39.0 - 52.0 %   MCV 99.0 78.0 - 100.0 fL   MCH 33.5 26.0 - 34.0 pg   MCHC 33.8 30.0 - 36.0 g/dL   RDW 12.4 11.5 - 15.5 %   Platelets 206 150 - 400  K/uL   Labs are reviewed and are pertinent for [facility] and alcohol  intoxication.  Current Facility-Administered Medications  Medication Dose Route Frequency Provider Last Rate Last Dose  . acetaminophen (TYLENOL) tablet 650 mg  650 mg Oral Q6H PRN Toy Baker, MD       Or  . acetaminophen (TYLENOL) suppository 650 mg  650 mg Rectal Q6H PRN Toy Baker, MD      . amoxicillin-clavulanate (AUGMENTIN) 875-125 MG per tablet 1 tablet  1 tablet Oral Q12H Toy Baker, MD   1 tablet at 06/19/14 2112  . folic acid injection 1 mg  1 mg Intravenous Daily Toy Baker, MD   1 mg at 06/19/14 1138  . LORazepam (ATIVAN) injection 2-3 mg  2-3 mg Intravenous Q1H PRN Toy Baker, MD      . ondansetron (ZOFRAN) tablet 4 mg  4 mg Oral Q6H PRN Toy Baker, MD       Or  . ondansetron (ZOFRAN) injection 4 mg  4 mg Intravenous Q6H PRN Toy Baker, MD      . sodium chloride 0.9 % injection 3 mL  3 mL Intravenous Q12H Toy Baker, MD   3 mL at 06/19/14 2200  . thiamine (B-1) injection 100 mg  100 mg Intravenous Daily Toy Baker, MD   100 mg at 06/19/14 1137  . traMADol (ULTRAM) tablet 50 mg  50 mg Oral Q6H PRN Toy Baker, MD   50 mg at 06/20/14 0523    Psychiatric Specialty Exam: Physical Exam as per history and physical   ROS tired   Blood pressure 127/98, pulse 104, temperature 98.2 F (36.8 C), temperature source Oral, resp. rate 20, height $RemoveBe'5\' 9"'GopHelVrr$  (1.753 m), weight 83.5 kg (184 lb 1.4 oz), SpO2 98 %.Body mass index is 27.17 kg/(m^2).  General Appearance: Casual  Eye Contact::  Good  Speech:  Clear and Coherent  Volume:  Normal  Mood:  Euthymic  Affect:  Appropriate and Congruent  Thought Process:  Coherent and Goal Directed  Orientation:  Full (Time, Place, and Person)  Thought Content:  WDL  Suicidal Thoughts:  No  Homicidal Thoughts:  No  Memory:  Immediate;   Good Recent;   Good  Judgement:  Intact  Insight:  Fair  Psychomotor Activity:  Normal  Concentration:  Good  Recall:  Good  Fund of  Knowledge:Good  Language: Good  Akathisia:  NA  Handed:  Right  AIMS (if indicated):     Assets:  Communication Skills Desire for Improvement Financial Resources/Insurance Housing Leisure Time [place] Talents/Skills  Sleep:      Musculoskeletal: Strength & Muscle Tone: within normal limits Gait & Station: normal Patient leans: N/A  Treatment Plan Summary: Daily contact with patient to assess and evaluate symptoms and progress in treatment Medication management  Refer to the outpatient psychiatric services and substance abuse treatment when medically stable   Bailyn Spackman,JANARDHAHA R. 06/20/2014 10:20 AM

## 2014-06-20 NOTE — Discharge Instructions (Signed)
Alcohol Intoxication  Alcohol intoxication occurs when the amount of alcohol that a person has consumed impairs his or her ability to mentally and physically function. Alcohol directly impairs the normal chemical activity of the brain. Drinking large amounts of alcohol can lead to changes in mental function and behavior, and it can cause many physical effects that can be harmful.   Alcohol intoxication can range in severity from mild to very severe. Various factors can affect the level of intoxication that occurs, such as the person's age, gender, weight, frequency of alcohol consumption, and the presence of other medical conditions (such as diabetes, seizures, or heart conditions). Dangerous levels of alcohol intoxication may occur when people drink large amounts of alcohol in a short period (binge drinking). Alcohol can also be especially dangerous when combined with certain prescription medicines or "recreational" drugs.  SIGNS AND SYMPTOMS  Some common signs and symptoms of mild alcohol intoxication include:  · Loss of coordination.  · Changes in mood and behavior.  · Impaired judgment.  · Slurred speech.  As alcohol intoxication progresses to more severe levels, other signs and symptoms will appear. These may include:  · Vomiting.  · Confusion and impaired memory.  · Slowed breathing.  · Seizures.  · Loss of consciousness.  DIAGNOSIS   Your health care provider will take a medical history and perform a physical exam. You will be asked about the amount and type of alcohol you have consumed. Blood tests will be done to measure the concentration of alcohol in your blood. In many places, your blood alcohol level must be lower than 80 mg/dL (0.08%) to legally drive. However, many dangerous effects of alcohol can occur at much lower levels.   TREATMENT   People with alcohol intoxication often do not require treatment. Most of the effects of alcohol intoxication are temporary, and they go away as the alcohol naturally  leaves the body. Your health care provider will monitor your condition until you are stable enough to go home. Fluids are sometimes given through an IV access tube to help prevent dehydration.   HOME CARE INSTRUCTIONS  · Do not drive after drinking alcohol.  · Stay hydrated. Drink enough water and fluids to keep your urine clear or pale yellow. Avoid caffeine.    · Only take over-the-counter or prescription medicines as directed by your health care provider.    SEEK MEDICAL CARE IF:   · You have persistent vomiting.    · You do not feel better after a few days.  · You have frequent alcohol intoxication. Your health care provider can help determine if you should see a substance use treatment counselor.  SEEK IMMEDIATE MEDICAL CARE IF:   · You become shaky or tremble when you try to stop drinking.    · You shake uncontrollably (seizure).    · You throw up (vomit) blood. This may be bright red or may look like black coffee grounds.    · You have blood in your stool. This may be bright red or may appear as a black, tarry, bad smelling stool.    · You become lightheaded or faint.    MAKE SURE YOU:   · Understand these instructions.  · Will watch your condition.  · Will get help right away if you are not doing well or get worse.  Document Released: 03/07/2005 Document Revised: 01/28/2013 Document Reviewed: 10/31/2012  ExitCare® Patient Information ©2015 [employer]. This information is not intended to replace advice given to you by your health care provider. Make sure   you discuss any questions you have with your health care provider.

## 2014-06-20 NOTE — Progress Notes (Signed)
Adam Novak to be D/C'd Home per MD order.  Discussed prescriptions and follow up appointments with the patient. Prescriptions given to patient, medication list explained in detail. Pt verbalized understanding.    Medication List    TAKE these medications        cephALEXin 500 MG capsule  Commonly known as:  KEFLEX  Take 1 capsule (500 mg total) by mouth 4 (four) times daily.     traMADol 50 MG tablet  Commonly known as:  ULTRAM  Take 1 tablet (50 mg total) by mouth every 6 (six) hours as needed.        Filed Vitals:   06/20/14 1609  BP: 147/97  Pulse: 87  Temp: 98.1 F (36.7 C)  Resp: 18    Skin clean, dry and intact without evidence of skin break down, no evidence of skin tears noted. IV catheter discontinued intact. Site without signs and symptoms of complications. Dressing and pressure applied. Pt denies pain at this time. No complaints noted.  An After Visit Summary was printed and given to the patient. Patient escorted via WC, and D/C home via private auto.  Viviana SimplerDumas, Meleah Demeyer S 06/20/2014 5:08 PM

## 2014-06-22 ENCOUNTER — Encounter (HOSPITAL_COMMUNITY): Payer: Self-pay | Admitting: Emergency Medicine

## 2014-06-22 ENCOUNTER — Emergency Department (HOSPITAL_COMMUNITY)
Admission: EM | Admit: 2014-06-22 | Discharge: 2014-06-22 | Discharge status: Against Medical Advice | Payer: MEDICAID | Attending: Emergency Medicine | Admitting: Emergency Medicine

## 2014-06-22 DIAGNOSIS — Y9289 Other specified places as the place of occurrence of the external cause: Secondary | ICD-10-CM | POA: Insufficient documentation

## 2014-06-22 DIAGNOSIS — T43595A Adverse effect of other antipsychotics and neuroleptics, initial encounter: Secondary | ICD-10-CM | POA: Insufficient documentation

## 2014-06-22 DIAGNOSIS — Z720 Tobacco use: Secondary | ICD-10-CM | POA: Insufficient documentation

## 2014-06-22 DIAGNOSIS — Y998 Other external cause status: Secondary | ICD-10-CM | POA: Insufficient documentation

## 2014-06-22 DIAGNOSIS — G2402 Drug induced acute dystonia: Secondary | ICD-10-CM

## 2014-06-22 DIAGNOSIS — G249 Dystonia, unspecified: Secondary | ICD-10-CM | POA: Insufficient documentation

## 2014-06-22 DIAGNOSIS — Z79899 Other long term (current) drug therapy: Secondary | ICD-10-CM | POA: Insufficient documentation

## 2014-06-22 DIAGNOSIS — Z792 Long term (current) use of antibiotics: Secondary | ICD-10-CM | POA: Insufficient documentation

## 2014-06-22 DIAGNOSIS — Y9389 Activity, other specified: Secondary | ICD-10-CM | POA: Insufficient documentation

## 2014-06-22 LAB — I-STAT CHEM 8, ED
BUN: 11 mg/dL (ref 6–23)
CREATININE: 0.8 mg/dL (ref 0.50–1.35)
Calcium, Ion: 1.08 mmol/L — ABNORMAL LOW (ref 1.12–1.23)
Chloride: 102 mEq/L (ref 96–112)
GLUCOSE: 97 mg/dL (ref 70–99)
HCT: 46 % (ref 39.0–52.0)
Hemoglobin: 15.6 g/dL (ref 13.0–17.0)
Potassium: 4.1 mmol/L (ref 3.5–5.1)
Sodium: 138 mmol/L (ref 135–145)
TCO2: 23 mmol/L (ref 0–100)

## 2014-06-22 MED ORDER — BENZTROPINE MESYLATE 1 MG PO TABS
1.0000 mg | ORAL_TABLET | Freq: Once | ORAL | Status: AC
  Administered 2014-06-22: 1 mg via ORAL
  Filled 2014-06-22: qty 1

## 2014-06-22 NOTE — Progress Notes (Signed)
  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 Pt states [facility]          Subjective/Objective Assessment Detail:   no pcp per pt States seen his father pc over 20 yrs ago Cm encouraged pt to have a personal pcp along with his [facility] services  Pt recently d/c in stable condition from [facility] for AMS, etoh abuse, withdrawal  During assessment pt noted to look/cut his eyes to his right at intervals [place] and agreeable to services offered  CM noted pt during RN ambulation assessment of pt He was noted to squint his eyes and be unsteady Pt informed Cm afterwards he felt dizzy     Action/Plan:   Spoke with pt assessed for pcp see notes below   Action/Plan Detail:   Anticipated DC Date:       Status Recommendation to Physician:   Result of Recommendation:    Other ED Services  Consult Working Psychologist, educationallan    DC Planning Services  Other  Outpatient Services - Pt will follow up  [facility] (established[facility]new)    Choice offered to / List presented to:            Status of service:  Completed, signed off  ED Comments:   ED Comments Detail:  CM spoke [place] pt who [place]. CM discussed and provided written information for self pay pcps, importance of pcp for f/u care, www.needymeds.org, www.goodrx.com, discounted pharmacies and other [place] resources such as [facility], [facility], affordable care act,  financial assistance, DSS and  health department  Reviewed resources for [place] self pay pcps like Adam Novak, [facility] at [address], [facility], general medical clinics, [facility] care plus others, medication  resources, [facility] out patient pharmacies and housing Pt voiced understanding and appreciation of resources provided  Provided [facility] contact information Pt agree to referral CM completed referral to [facility]

## 2014-06-22 NOTE — ED Provider Notes (Signed)
CSN: [id number]     Arrival date & time 06/22/14  1258 History   First MD Initiated Contact with Patient 06/22/14 1321     Chief Complaint  Patient presents with  . Neck Pain  . Dizziness     (Consider location/radiation/quality/duration/timing/severity/associated sxs/prior Treatment) HPI  Pt presents with c/o feeling a spasm in the left side of his neck- he feels his neck was stuck to the right side and he wasn't able to turn his head to the left.  He states when this occurred he was walking to the bus stop.  He felt dizzy and felt that he might faint.  No chest pain or palpitations.  No focal weakness.  No changes in vision or speech.  He was started on risperdal by [facility] yesterday.  Upon arrival to the ED symptoms have mostly resolved, he feels some spasm in his left neck.  There are no other associated systemic symptoms, there are no other alleviating or modifying factors.   History reviewed. No pertinent past medical history. Past Surgical History  Procedure Laterality Date  . Knee surgery Right    History reviewed. No pertinent family history. History  Substance Use Topics  . Smoking status: Current Some Day Smoker  . Smokeless tobacco: Not on file  . Alcohol Use: Yes     Comment: socially    Review of Systems  ROS reviewed and all otherwise negative except for mentioned in HPI    Allergies  Review of patient's allergies indicates no known allergies.  Home Medications   Prior to Admission medications   Medication Sig Start Date End Date Taking? Authorizing Provider  risperiDONE (RISPERDAL) 2 MG tablet Take 2 mg by mouth daily.   Yes Historical Provider, MD  cephALEXin (KEFLEX) 500 MG capsule Take 1 capsule (500 mg total) by mouth 4 (four) times daily. Patient not taking: Reported on 06/19/2014 01/10/14   Joni ReiningNicole Pisciotta, PA-C  traMADol (ULTRAM) 50 MG tablet Take 1 tablet (50 mg total) by mouth every 6 (six) hours as needed. Patient not taking: Reported on 06/19/2014 01/10/14    Joni ReiningNicole Pisciotta, PA-C   BP 148/93 mmHg  Pulse 103  Temp(Src) 99 F (37.2 C) (Oral)  Resp 19  SpO2 100%  Vitals reviewed Physical Exam  Physical Examination: General appearance - alert, well appearing, and in no distress Mental status - alert, oriented to person, place, and time Eyes - pupils equal and reactive, extraocular eye movements intact Mouth - mucous membranes moist, pharynx normal without lesions Neck - supple, no significant adenopathy, mild ttp over superior SCM distribution, FROM of neck without pain Chest - clear to auscultation, no wheezes, rales or rhonchi, symmetric air entry Heart - normal rate, regular rhythm, normal S1, S2, no murmurs, rubs, clicks or gallops Neurological - alert, oriented x 3, normal speech, no cranial nerve deficit, strength 5/5 in extremitieis, normal gait Extremities - peripheral pulses normal, no pedal edema, no clubbing or cyanosis Skin - normal coloration and turgor, no rashes Psych- calm and cooperative, normal affect  ED Course  Procedures (including critical care time) Labs Review Labs Reviewed  I-STAT CHEM 8, ED - Abnormal; Notable for the following:    Calcium, Ion 1.08 (*)    All other components within normal limits    Imaging Review No results found.   EKG Interpretation   Date/Time:  Tuesday June 22 2014 13:07:12 EST Ventricular Rate:  109 PR Interval:  132 QRS Duration: 100 QT Interval:  344 QTC Calculation: 463 R  Axis:   27 Text Interpretation:  Sinus tachycardia with Premature atrial complexes  Incomplete right bundle branch block Borderline ECG Since previous tracing  heart rate increased Confirmed by Karma Ganja  MD, MARTHA [phone]) on 06/22/2014  1:55:13 PM      MDM   Final diagnoses:  Dystonic drug reaction    Pt presents with acute onset of necks spasm/torticollis- most likely dystonic reaction.  Pt with reassuring EKG and labs.  Pt given po cogentin but symptoms had mostly resolved at the time of my  evaluation.  Prior to my recheck pt apparently left the ED- per nursing he did not notify staff that he was leaving.      Ethelda Chick, MD 06/22/14 [phone]

## 2014-06-22 NOTE — ED Notes (Signed)
[facility] Expected date:  Expected time:  Means of arrival:  Comments: Ems- neck pain, dystonia from risperdal

## 2014-06-22 NOTE — ED Notes (Signed)
Pt went to the bathroom and never returned.  Pt no where to be found in the department.

## 2014-06-22 NOTE — ED Notes (Addendum)
Per EMS pt c/o neck pain in left upper neck, difficulty turning head, and dizziness onset today in court. Pt [facility]  On assessment, pt states he also had some difficulty with cognition, states he feels "foggy in my head" and states he had some trouble speaking and thinking during this episode. States onset was 45 minutes ago.

## 2015-05-14 ENCOUNTER — Emergency Department (HOSPITAL_COMMUNITY)
Admission: EM | Admit: 2015-05-14 | Discharge: 2015-05-14 | Disposition: A | Discharge status: Other Acute Inpatient Hospital | Payer: Self-pay | Attending: Emergency Medicine | Admitting: Emergency Medicine

## 2015-05-14 ENCOUNTER — Encounter (HOSPITAL_COMMUNITY): Payer: Self-pay | Admitting: *Deleted

## 2015-05-14 ENCOUNTER — Inpatient Hospital Stay (HOSPITAL_COMMUNITY)
Admission: AD | Admit: 2015-05-14 | Discharge: 2015-05-17 | DRG: 885 | Disposition: A | Discharge status: Home / Self Care | Payer: Federal, State, Local not specified - Other | Source: Intra-hospital | Attending: Psychiatry | Admitting: Psychiatry

## 2015-05-14 DIAGNOSIS — G4700 Insomnia, unspecified: Secondary | ICD-10-CM | POA: Diagnosis present

## 2015-05-14 DIAGNOSIS — F1310 Sedative, hypnotic or anxiolytic abuse, uncomplicated: Secondary | ICD-10-CM | POA: Diagnosis present

## 2015-05-14 DIAGNOSIS — F1210 Cannabis abuse, uncomplicated: Secondary | ICD-10-CM | POA: Diagnosis present

## 2015-05-14 DIAGNOSIS — F1020 Alcohol dependence, uncomplicated: Secondary | ICD-10-CM | POA: Diagnosis present

## 2015-05-14 DIAGNOSIS — Y9389 Activity, other specified: Secondary | ICD-10-CM | POA: Insufficient documentation

## 2015-05-14 DIAGNOSIS — F99 Mental disorder, not otherwise specified: Secondary | ICD-10-CM | POA: Insufficient documentation

## 2015-05-14 DIAGNOSIS — F17210 Nicotine dependence, cigarettes, uncomplicated: Secondary | ICD-10-CM | POA: Diagnosis present

## 2015-05-14 DIAGNOSIS — F3130 Bipolar disorder, current episode depressed, mild or moderate severity, unspecified: Secondary | ICD-10-CM | POA: Diagnosis not present

## 2015-05-14 DIAGNOSIS — F1910 Other psychoactive substance abuse, uncomplicated: Secondary | ICD-10-CM | POA: Diagnosis present

## 2015-05-14 DIAGNOSIS — F17200 Nicotine dependence, unspecified, uncomplicated: Secondary | ICD-10-CM | POA: Insufficient documentation

## 2015-05-14 DIAGNOSIS — F419 Anxiety disorder, unspecified: Secondary | ICD-10-CM | POA: Diagnosis present

## 2015-05-14 DIAGNOSIS — R45851 Suicidal ideations: Secondary | ICD-10-CM | POA: Diagnosis present

## 2015-05-14 DIAGNOSIS — Z79899 Other long term (current) drug therapy: Secondary | ICD-10-CM | POA: Insufficient documentation

## 2015-05-14 DIAGNOSIS — S01511A Laceration without foreign body of lip, initial encounter: Secondary | ICD-10-CM | POA: Insufficient documentation

## 2015-05-14 DIAGNOSIS — S20212A Contusion of left front wall of thorax, initial encounter: Secondary | ICD-10-CM | POA: Insufficient documentation

## 2015-05-14 DIAGNOSIS — F319 Bipolar disorder, unspecified: Principal | ICD-10-CM | POA: Diagnosis present

## 2015-05-14 DIAGNOSIS — Y9289 Other specified places as the place of occurrence of the external cause: Secondary | ICD-10-CM | POA: Insufficient documentation

## 2015-05-14 DIAGNOSIS — Y999 Unspecified external cause status: Secondary | ICD-10-CM | POA: Insufficient documentation

## 2015-05-14 LAB — RAPID URINE DRUG SCREEN, HOSP PERFORMED
Amphetamines: NOT DETECTED
BARBITURATES: NOT DETECTED
BENZODIAZEPINES: POSITIVE — AB
COCAINE: NOT DETECTED
Opiates: NOT DETECTED
TETRAHYDROCANNABINOL: POSITIVE — AB

## 2015-05-14 LAB — CBC WITH DIFFERENTIAL/PLATELET
BASOS ABS: 0 10*3/uL (ref 0.0–0.1)
BASOS PCT: 0 %
Eosinophils Absolute: 0.1 10*3/uL (ref 0.0–0.7)
Eosinophils Relative: 1 %
HEMATOCRIT: 41.9 % (ref 39.0–52.0)
HEMOGLOBIN: 14.8 g/dL (ref 13.0–17.0)
LYMPHS PCT: 22 %
Lymphs Abs: 2.7 10*3/uL (ref 0.7–4.0)
MCH: 33.7 pg (ref 26.0–34.0)
MCHC: 35.3 g/dL (ref 30.0–36.0)
MCV: 95.4 fL (ref 78.0–100.0)
MONOS PCT: 6 %
Monocytes Absolute: 0.8 10*3/uL (ref 0.1–1.0)
NEUTROS ABS: 8.5 10*3/uL — AB (ref 1.7–7.7)
NEUTROS PCT: 71 %
Platelets: 249 10*3/uL (ref 150–400)
RBC: 4.39 MIL/uL (ref 4.22–5.81)
RDW: 12.7 % (ref 11.5–15.5)
WBC: 12.1 10*3/uL — ABNORMAL HIGH (ref 4.0–10.5)

## 2015-05-14 LAB — COMPREHENSIVE METABOLIC PANEL
ALK PHOS: 57 U/L (ref 38–126)
ALT: 28 U/L (ref 17–63)
ANION GAP: 12 (ref 5–15)
AST: 30 U/L (ref 15–41)
Albumin: 4.4 g/dL (ref 3.5–5.0)
BUN: 7 mg/dL (ref 6–20)
CALCIUM: 8.9 mg/dL (ref 8.9–10.3)
CO2: 25 mmol/L (ref 22–32)
CREATININE: 0.81 mg/dL (ref 0.61–1.24)
Chloride: 104 mmol/L (ref 101–111)
Glucose, Bld: 94 mg/dL (ref 65–99)
Potassium: 3.6 mmol/L (ref 3.5–5.1)
SODIUM: 141 mmol/L (ref 135–145)
TOTAL PROTEIN: 7 g/dL (ref 6.5–8.1)
Total Bilirubin: 0.6 mg/dL (ref 0.3–1.2)

## 2015-05-14 LAB — ETHANOL: ALCOHOL ETHYL (B): 230 mg/dL — AB (ref <5)

## 2015-05-14 MED ORDER — ALUM & MAG HYDROXIDE-SIMETH 200-200-20 MG/5ML PO SUSP: 30.0000 mL | ORAL | Status: DC | PRN

## 2015-05-14 MED ORDER — BENZTROPINE MESYLATE 0.5 MG PO TABS
0.5000 mg | ORAL_TABLET | Freq: Two times a day (BID) | ORAL | Status: DC
  Administered 2015-05-14 – 2015-05-17 (×5): 0.5 mg via ORAL
  Filled 2015-05-14 (×5): qty 1
  Filled 2015-05-14: qty 14
  Filled 2015-05-14 (×4): qty 1
  Filled 2015-05-14: qty 14

## 2015-05-14 MED ORDER — CHLORDIAZEPOXIDE HCL 25 MG PO CAPS
25.0000 mg | ORAL_CAPSULE | ORAL | Status: AC
  Administered 2015-05-16 – 2015-05-17 (×2): 25 mg via ORAL
  Filled 2015-05-14 (×2): qty 1

## 2015-05-14 MED ORDER — ACETAMINOPHEN 325 MG PO TABS
650.0000 mg | ORAL_TABLET | Freq: Four times a day (QID) | ORAL | Status: DC | PRN
  Administered 2015-05-15: 650 mg via ORAL
  Filled 2015-05-14: qty 2

## 2015-05-14 MED ORDER — CHLORDIAZEPOXIDE HCL 25 MG PO CAPS: 25.0000 mg | ORAL_CAPSULE | Freq: Every day | ORAL | Status: DC

## 2015-05-14 MED ORDER — HYDROXYZINE HCL 25 MG PO TABS
25.0000 mg | ORAL_TABLET | Freq: Four times a day (QID) | ORAL | Status: AC | PRN
  Administered 2015-05-14 – 2015-05-16 (×3): 25 mg via ORAL
  Filled 2015-05-14 (×3): qty 1

## 2015-05-14 MED ORDER — IBUPROFEN 400 MG PO TABS: 600.0000 mg | ORAL_TABLET | Freq: Three times a day (TID) | ORAL | Status: DC | PRN

## 2015-05-14 MED ORDER — CHLORDIAZEPOXIDE HCL 25 MG PO CAPS
25.0000 mg | ORAL_CAPSULE | Freq: Four times a day (QID) | ORAL | Status: AC | PRN
  Administered 2015-05-15: 25 mg via ORAL
  Filled 2015-05-14 (×2): qty 1

## 2015-05-14 MED ORDER — ADULT MULTIVITAMIN W/MINERALS CH
1.0000 | ORAL_TABLET | Freq: Every day | ORAL | Status: DC
Start: 2015-05-14 — End: 2015-05-17
  Administered 2015-05-14 – 2015-05-17 (×4): 1 via ORAL
  Filled 2015-05-14 (×7): qty 1

## 2015-05-14 MED ORDER — ONDANSETRON 4 MG PO TBDP: 4.0000 mg | ORAL_TABLET | Freq: Four times a day (QID) | ORAL | Status: AC | PRN

## 2015-05-14 MED ORDER — NICOTINE 21 MG/24HR TD PT24: 21.0000 mg | MEDICATED_PATCH | Freq: Every day | TRANSDERMAL | Status: DC

## 2015-05-14 MED ORDER — MAGNESIUM HYDROXIDE 400 MG/5ML PO SUSP: 30.0000 mL | Freq: Every day | ORAL | Status: DC | PRN

## 2015-05-14 MED ORDER — CHLORDIAZEPOXIDE HCL 25 MG PO CAPS
ORAL_CAPSULE | ORAL | Status: AC
  Administered 2015-05-14: 500 mg
  Filled 2015-05-14: qty 1

## 2015-05-14 MED ORDER — LOPERAMIDE HCL 2 MG PO CAPS: 2.0000 mg | ORAL_CAPSULE | ORAL | Status: AC | PRN

## 2015-05-14 MED ORDER — CHLORDIAZEPOXIDE HCL 25 MG PO CAPS
25.0000 mg | ORAL_CAPSULE | Freq: Four times a day (QID) | ORAL | Status: AC
  Administered 2015-05-14 – 2015-05-15 (×4): 25 mg via ORAL
  Filled 2015-05-14 (×2): qty 1

## 2015-05-14 MED ORDER — RISPERIDONE 2 MG PO TABS
2.0000 mg | ORAL_TABLET | Freq: Every day | ORAL | Status: DC
  Administered 2015-05-14: 2 mg via ORAL
  Filled 2015-05-14 (×5): qty 1

## 2015-05-14 MED ORDER — CHLORDIAZEPOXIDE HCL 25 MG PO CAPS
25.0000 mg | ORAL_CAPSULE | Freq: Three times a day (TID) | ORAL | Status: AC
  Administered 2015-05-15 – 2015-05-16 (×3): 25 mg via ORAL
  Filled 2015-05-14 (×3): qty 1

## 2015-05-14 MED ORDER — ACETAMINOPHEN 325 MG PO TABS: 650.0000 mg | ORAL_TABLET | ORAL | Status: DC | PRN

## 2015-05-14 MED ORDER — ACETAMINOPHEN 500 MG PO TABS
ORAL_TABLET | ORAL | Status: AC
  Administered 2015-05-14: 500 mg
  Filled 2015-05-14: qty 1

## 2015-05-14 MED ORDER — ZOLPIDEM TARTRATE 5 MG PO TABS: 5.0000 mg | ORAL_TABLET | Freq: Every evening | ORAL | Status: DC | PRN

## 2015-05-14 MED ORDER — THIAMINE HCL 100 MG/ML IJ SOLN: 100.0000 mg | Freq: Once | INTRAMUSCULAR | Status: DC

## 2015-05-14 MED ORDER — BENZTROPINE MESYLATE 1 MG PO TABS
1.0000 mg | ORAL_TABLET | Freq: Every day | ORAL | Status: DC
  Administered 2015-05-14: 1 mg via ORAL
  Filled 2015-05-14 (×2): qty 1

## 2015-05-14 MED ORDER — ONDANSETRON HCL 4 MG PO TABS: 4.0000 mg | ORAL_TABLET | Freq: Three times a day (TID) | ORAL | Status: DC | PRN

## 2015-05-14 MED ORDER — ZIPRASIDONE MESYLATE 20 MG IM SOLR: 10.0000 mg | Freq: Once | INTRAMUSCULAR | Status: DC

## 2015-05-14 MED ORDER — LORAZEPAM 1 MG PO TABS
1.0000 mg | ORAL_TABLET | Freq: Three times a day (TID) | ORAL | Status: DC | PRN
Start: 2015-05-14 — End: 2015-05-14

## 2015-05-14 MED ORDER — VITAMIN B-1 100 MG PO TABS
100.0000 mg | ORAL_TABLET | Freq: Every day | ORAL | Status: DC
Start: 2015-05-15 — End: 2015-05-17
  Administered 2015-05-15 – 2015-05-17 (×3): 100 mg via ORAL
  Filled 2015-05-14 (×6): qty 1
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
## 2015-05-14 NOTE — ED Notes (Signed)
Security at bedside to wand patient. 

## 2015-05-14 NOTE — ED Notes (Signed)
TTS at bedside and in progress 
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
## 2015-05-14 NOTE — ED Notes (Signed)
Pt denies SI/HI at current time. Pt calm and cooperative. Pt changed into burgundy scrubs. Security paged to come wand patient. [employer] at bedside. Bilateral wrist restraints removed due to pt calm and cooperative status. Pt belongings removed and placed in belonging bags.

## 2015-05-14 NOTE — ED Notes (Signed)
Pt noted w/redness to bil wrists. [facility]. Advised pt inappropriate behavior.

## 2015-05-14 NOTE — ED Notes (Signed)
Pt given water and food.  

## 2015-05-14 NOTE — Progress Notes (Signed)
D.  Pt in bed on approach, did not feel well enough to attend evening AA group.  Pt is a new admission and has remained in bed this shift.  Denies SI/HI/hallucinations at this time.  Minimal interaction due to [name] wish to "just sleep".  A.  Support and encouragement offered  R.  Pt remains safe on the unit, will continue to monitor.

## 2015-05-14 NOTE — Tx Team (Signed)
Initial Interdisciplinary Treatment Plan   PATIENT STRESSORS: Financial difficulties Loss of sister Marital or family conflict Substance abuse   PATIENT STRENGTHS: Ability for insight Active sense of humor Average or above average intelligence Capable of independent living General fund of knowledge Motivation for treatment/growth   PROBLEM LIST: Problem List/Patient Goals Date to be addressed Date deferred Reason deferred Estimated date of resolution  Depression 05/14/15     Suicidal Ideation 05/14/15     Substance abuse 05/14/15     "Work on depression and anxiety" 05/14/15     "I'm not a good person, I don't know what to do" 05/14/15                              DISCHARGE CRITERIA:  Ability to meet basic life and health needs Improved stabilization in mood, thinking, and/or behavior Verbal commitment to aftercare and medication compliance  PRELIMINARY DISCHARGE PLAN: Attend aftercare/continuing care group  PATIENT/FAMIILY INVOLVEMENT: This treatment plan has been presented to and reviewed with the patient, Cleda DaubBrandon J XXXStephens, and/or family member, .  The patient and family have been given the opportunity to ask questions and make suggestions.  Larron Armor, [place] 05/14/2015, 12:21 PM
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
## 2015-05-14 NOTE — ED Notes (Signed)
Erskine SquibbJane, Staffing Office, aware of need for sitter.

## 2015-05-14 NOTE — ED Notes (Signed)
[facility]. Pt needs UDS and cooperative behavior to be transferred. Dr.Miller to fill out Exam and Recommendation; IVC paperwork with EDP
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
## 2015-05-15 ENCOUNTER — Other Ambulatory Visit: Payer: Self-pay

## 2015-05-15 DIAGNOSIS — R45851 Suicidal ideations: Secondary | ICD-10-CM

## 2015-05-15 DIAGNOSIS — F3130 Bipolar disorder, current episode depressed, mild or moderate severity, unspecified: Secondary | ICD-10-CM

## 2015-05-15 MED ORDER — BENZOCAINE 10 % MT GEL
Freq: Two times a day (BID) | OROMUCOSAL | Status: DC | PRN
  Filled 2015-05-15: qty 9.4

## 2015-05-15 MED ORDER — OLANZAPINE 5 MG PO TABS
5.0000 mg | ORAL_TABLET | Freq: Every day | ORAL | Status: DC
  Administered 2015-05-15 – 2015-05-16 (×2): 5 mg via ORAL
  Filled 2015-05-15: qty 7
  Filled 2015-05-15 (×3): qty 1

## 2015-05-15 NOTE — Progress Notes (Signed)
D: [name] mood is depressed and affect is congruent. He rates his depression 6/10, hopelessness 7/10, and anxiety 7/10. Denies SI/HI/AVH. Pt is irritable at times but cooperative.  A: Support given. Verbalization encouraged. Pt encouraged to come to staff for any concerns. Medications given as prescribed. R: Pt is receptive. No complaints at this time. Q15 min safety checks maintained. Pt remains safe on the unit. Will continue to monitor.
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
## 2015-05-15 NOTE — [facility] Group Notes (Signed)
[facility] Group Notes:  (Nursing/MHT/Case Management/Adjunct)  Date:  05/15/2015  Time:  2:42 PM  Type of Therapy:  Group Therapy  Participation Level:  Did Not Attend  Participation Quality:   Affect:   Cognitive:    Insight:   Engagement in Group:   Modes of Intervention:   Summary of Progress/Problems:  Adam Novak, Adam Novak 05/15/2015, 2:42 PM

## 2015-05-15 NOTE — [facility] Group Notes (Signed)
[facility] Group Notes: (Clinical Social Work)   05/15/2015      Type of Therapy:  Group Therapy   Participation Level:  Did Not Attend despite MHT prompting   Laylia Mui Grossman-Orr, LCSW 05/15/2015, 11:50 AM     
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
## 2015-05-15 NOTE — Progress Notes (Signed)
Psychoeducational Group Note  Date:  05/15/2015 Time: 2240  Group Topic/Focus:  Wrap-Up Group:   The focus of this group is to help patients review their daily goal of treatment and discuss progress on daily workbooks.  Participation Level: Did Not Attend  Participation Quality:  Not Applicable  Affect:  Not Applicable  Cognitive:  Not Applicable  Insight:  Not Applicable  Engagement in Group: Not Applicable  Additional Comments: The patient did not attend this evening's A.A. Meeting.   Zyaira Vejar S 05/15/2015, 10:40 PM

## 2015-05-16 DIAGNOSIS — F1910 Other psychoactive substance abuse, uncomplicated: Secondary | ICD-10-CM

## 2015-05-16 MED ORDER — DIVALPROEX SODIUM ER 250 MG PO TB24
250.0000 mg | ORAL_TABLET | Freq: Two times a day (BID) | ORAL | Status: DC
  Administered 2015-05-16 – 2015-05-17 (×3): 250 mg via ORAL
  Filled 2015-05-16: qty 1
  Filled 2015-05-16: qty 14
  Filled 2015-05-16 (×4): qty 1
  Filled 2015-05-16: qty 14

## 2015-05-16 NOTE — [facility] Suicide Risk Assessment (Signed)
[facility] INPATIENT:  Family/Significant Other Suicide Prevention Education  Suicide Prevention Education:  Contact Attempts: Adam RushingSerita Novak (pt's sister) [phone] has been identified by the patient as the family member/significant other with whom the patient will be residing, and identified as the person(s) who will aid the patient in the event of a mental health crisis.  With written consent from the patient, two attempts were made to provide suicide prevention education, prior to and/or following the patient's discharge.  We were unsuccessful in providing suicide prevention education.  A suicide education pamphlet was given to the patient to share with family/significant other.  Date and time of first attempt: 11:00AM on 05/16/15 (voicemail left requesting call back)  Date and time of second attempt: 3:25PM on 05/16/15 (Voicemail left requesting call back)   Smart, Teisha Trowbridge LCSW 05/16/2015, 3:30 PM

## 2015-05-16 NOTE — Progress Notes (Signed)
Pt did not attend AA meeting 

## 2015-05-16 NOTE — Progress Notes (Signed)
D: Pt denies SI/HI/AVH. Pt is pleasant and cooperative. Pt appears to have some insight into improving his life. Pt stated he will possibly stay with his sister after he D/C. Pt believes the medications are making him feel "much better". 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  A: Pt was offered support and encouragement. Pt was given scheduled medications. Pt was encourage to attend groups. Q 15 minute checks were done for safety.   R:Pt interacts well with peers and staff. Pt is taking medication. Pt has no complaints.Pt receptive to treatment and safety maintained on unit.
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
## 2015-05-16 NOTE — BHH Counselor (Addendum)
Duplicate note

## 2015-05-16 NOTE — [facility] Group Notes (Signed)
[facility]   05/16/2015 11:44 AM  Participation Quality:  Invited. Chose to remain in bed.   Smart, [name] LCSW
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
## 2015-05-16 NOTE — B[facility] Group Notes (Signed)
[facility] LCSW Group Therapy  05/16/2015 1:01 PM  Type of Therapy:  Group Therapy  Participation Level:  Did Not Attend-pt invited. Chose to remain in bed.   Modes of Intervention:  Confrontation, Discussion, Education, Exploration, Problem-solving, Rapport Building, Socialization and Support  Summary of Progress/Problems: Today's Topic: Overcoming Obstacles. Patients identified one short term goal and potential obstacles in reaching this goal. Patients processed barriers involved in overcoming these obstacles. Patients identified steps necessary for overcoming these obstacles and explored motivation (internal and external) for facing these difficulties head on.   Smart, Adam Hickmon LCSW 05/16/2015, 1:01 PM

## 2015-05-16 NOTE — Progress Notes (Signed)
D: Patient states he is sleeping and eating well.  His concentration is good; energy level is normal.  He rates his depression as a 5; hopelessness as a 3; anxiety as a 7. He denies any withdrawal symptoms.  He complains of pain in chest, head and lips.  His goal today is to "be less angry."  He denies SI/HI/AVH. A: Continue to monitor medication management and MD orders.  Safety checks completed every 15 minutes per protocol.  Offer support and encouragement as needed. R: Patient has minimal interaction with staff.
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
## 2015-05-16 NOTE — Plan of Care (Signed)
Problem: Ineffective individual coping Goal: LTG: Patient will report a decrease in negative feelings Outcome: Progressing Pt stated he was doing

## 2015-05-17 MED ORDER — OLANZAPINE 5 MG PO TABS: 5.0000 mg | ORAL_TABLET | Freq: Every day | ORAL | Status: DC

## 2015-05-17 MED ORDER — BENZTROPINE MESYLATE 0.5 MG PO TABS: 0.5000 mg | ORAL_TABLET | Freq: Two times a day (BID) | ORAL | Status: DC

## 2015-05-17 MED ORDER — ACAMPROSATE CALCIUM 333 MG PO TBEC
666.0000 mg | DELAYED_RELEASE_TABLET | Freq: Three times a day (TID) | ORAL | Status: DC
  Administered 2015-05-17: 666 mg via ORAL
  Filled 2015-05-17: qty 2
  Filled 2015-05-17 (×2): qty 42
  Filled 2015-05-17: qty 2
  Filled 2015-05-17: qty 42
  Filled 2015-05-17: qty 2

## 2015-05-17 MED ORDER — DIVALPROEX SODIUM ER 250 MG PO TB24: 250.0000 mg | ORAL_TABLET | Freq: Two times a day (BID) | ORAL | Status: DC

## 2015-05-17 MED ORDER — ACAMPROSATE CALCIUM 333 MG PO TBEC: 666.0000 mg | DELAYED_RELEASE_TABLET | Freq: Three times a day (TID) | ORAL | Status: DC

## 2015-05-17 NOTE — Progress Notes (Signed)
Pt discharged home with a bus pass. Pt was ambulatory, stable and appreciative at that time. All papers and prescriptions were given and valuables returned. Verbal understanding expressed. Denies SI/HI and A/VH. Pt given opportunity to express concerns and ask questions. 
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
## 2015-05-17 NOTE — [facility] Suicide Risk Assessment (Signed)
[facility] Discharge Suicide Risk Assessment   Demographic Factors:  Male and Caucasian, young adult  Total Time spent with patient: 20 minutes  Musculoskeletal: Strength & Muscle Tone: within normal limits Gait & Station: normal Patient leans: normal  Psychiatric Specialty Exam: Physical Exam  Review of Systems  Constitutional: Negative.   HENT: Negative.   Eyes: Negative.   Respiratory: Negative.   Cardiovascular: Negative.   Gastrointestinal: Negative.   Genitourinary: Negative.   Musculoskeletal: Negative.   Skin: Negative.   Neurological: Negative.   Endo/Heme/Allergies: Negative.   Psychiatric/Behavioral: Positive for substance abuse. The patient is nervous/anxious.     Blood pressure 146/84, pulse 75, temperature 97.3 F (36.3 C), temperature source Oral, resp. rate 16, height  (1.753 m), weight 83.915 kg (185 lb).Body mass index is 27.31 kg/(m^2).  General Appearance: Fairly Groomed  Patent attorney::  Fair  Speech:  Clear and Coherent409  Volume:  Normal  Mood:  Anxious  Affect:  Appropriate  Thought Process:  Coherent and Goal Directed  Orientation:  Full (Time, Place, and Person)  Thought Content:  Plans as he moves on, relapse prevention plan  Suicidal Thoughts:  No  Homicidal Thoughts:  No  Memory:  Immediate;   Fair Recent;   Fair Remote;   Fair  Judgement:  Fair  Insight:  Present  Psychomotor Activity:  Normal  Concentration:  Fair  Recall:  [employer] of Knowledge:Fair  Language: Fair  Akathisia:  No  Handed:  Right  AIMS (if indicated):     Assets:  Desire for Improvement Talents/Skills Vocational/Educational  Sleep:  Number of Hours: 6.5  Cognition: WNL  ADL's:  Intact   Have you used any form of tobacco in the last 30 days? (Cigarettes, Smokeless Tobacco, Cigars, and/or Pipes): Yes  Has this patient used any form of tobacco in the last 30 days? (Cigarettes, Smokeless Tobacco, Cigars, and/or Pipes) Yes, A prescription for an FDA-approved  tobacco cessation medication was offered at discharge and the patient refused  Mental Status Per Nursing Assessment::   On Admission:     Current Mental Status by Physician: In full contact with reality. There are no active S/S of withdrawal. There are no active SI plans or intent. He states he is committed to abstinence. He is going to stay with a friend who does not drink. He will not immediately go back home. He has talked to his father and he agrees he needs to quit drinking himself. He feels the medications are agreeing with him. He is willing to take the Campral for cravings. He is hoping to get the job at [employer]. He has some good inside contacts   Loss Factors: NA  Historical Factors: NA  Risk Reduction Factors:   Sense of responsibility to family, Living with another person, especially a relative and Positive social support  Continued Clinical Symptoms:  Alcohol/Substance Abuse/Dependencies  Cognitive Features That Contribute To Risk:  Closed-mindedness, Polarized thinking and Thought constriction (tunnel vision)    Suicide Risk:  Minimal: No identifiable suicidal ideation.  Patients presenting with no risk factors but with morbid ruminations; may be classified as minimal risk based on the severity of the depressive symptoms  Principal Problem: Bipolar I disorder, most recent episode depressed [facility]) Discharge Diagnoses:  Patient Active Problem List   Diagnosis Date Noted  . Bipolar I disorder, most recent episode depressed (HCC) [F31.30] 05/15/2015  . Dog bite of left calf [S81.852A, W54.0XXA] 06/19/2014  . Alcohol abuse [F10.10] 06/18/2014  . Alcohol withdrawal (  HCC) [F10.239] 06/18/2014  . Alcohol intoxication, episodic (HCC) [F10.129] 06/18/2014  . Encephalopathy [G93.40] 06/18/2014  . Leukocytosis [D72.829]   . Polysubstance abuse [F19.10]   . CLOSED FRACTURE OF NECK OF METACARPAL BONE [S62.339A] 06/16/2010    Follow-up Information    Follow up with [facility].   Why:   Walk in between 8am-9am Monday through Friday for hospital follow-up/medication management/assessment for counseling services.    Contact information:   [address] [place], [place] [address] Phone: [phone] Fax: [phone]      Plan Of Care/Follow-up recommendations:  Activity:  as tolerated Diet:  regular Follow up [facility] as above Is patient on multiple antipsychotic therapies at discharge:  No   Has Patient had three or more failed trials of antipsychotic monotherapy by history:  No  Recommended Plan for Multiple Antipsychotic Therapies: NA    Christal Lagerstrom A 05/17/2015, 12:12 PM
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
## 2015-05-17 NOTE — Progress Notes (Signed)
  [facility] :  Will you be returning to the same living situation after discharge:  No.Pt plans to stay with sister. At discharge, do you have transportation home?: Yes,  sister or friend Do you have the ability to pay for your medications: Yes,  mental health  Release of information consent forms completed and submitted to medical records by CSW.  Patient to Follow up at: Follow-up Information    Follow up with [facility].   Why:  Walk in between 8am-9am Monday through Friday for hospital follow-up/medication management/assessment for counseling services.    Contact information:   [address] [place], [place] [address] Phone: [phone] Fax: [phone]      Next level of care provider has access to [facility] Link:no  Patient denies SI/HI: Yes,  during self report.     Safety Planning and Suicide Prevention discussed: Yes,  SPE completed with pt; contact attempts made with pt's sister. SPI pamphlet and Mobile Crisis information provided to pt. He was encouraged to share information with support network, ask questions, and talk about any concerns  Have you used any form of tobacco in the last 30 days? (Cigarettes, Smokeless Tobacco, Cigars, and/or Pipes): Yes  Has patient been referred to the Quitline?: Patient refused referral  Smart, Herbert SetaHeather LCSW 05/17/2015, 9:31 AM

## 2015-12-15 ENCOUNTER — Emergency Department (HOSPITAL_COMMUNITY)
Admission: EM | Admit: 2015-12-15 | Discharge: 2015-12-16 | Disposition: A | Discharge status: Other Acute Inpatient Hospital | Payer: Self-pay | Attending: Emergency Medicine | Admitting: Emergency Medicine

## 2015-12-15 ENCOUNTER — Encounter (HOSPITAL_COMMUNITY): Payer: Self-pay | Admitting: Emergency Medicine

## 2015-12-15 DIAGNOSIS — Z79899 Other long term (current) drug therapy: Secondary | ICD-10-CM | POA: Insufficient documentation

## 2015-12-15 DIAGNOSIS — Y929 Unspecified place or not applicable: Secondary | ICD-10-CM | POA: Insufficient documentation

## 2015-12-15 DIAGNOSIS — F319 Bipolar disorder, unspecified: Secondary | ICD-10-CM | POA: Insufficient documentation

## 2015-12-15 DIAGNOSIS — S0181XA Laceration without foreign body of other part of head, initial encounter: Secondary | ICD-10-CM

## 2015-12-15 DIAGNOSIS — F3130 Bipolar disorder, current episode depressed, mild or moderate severity, unspecified: Secondary | ICD-10-CM | POA: Diagnosis present

## 2015-12-15 DIAGNOSIS — F1290 Cannabis use, unspecified, uncomplicated: Secondary | ICD-10-CM | POA: Insufficient documentation

## 2015-12-15 DIAGNOSIS — Y9389 Activity, other specified: Secondary | ICD-10-CM | POA: Insufficient documentation

## 2015-12-15 DIAGNOSIS — Y999 Unspecified external cause status: Secondary | ICD-10-CM | POA: Insufficient documentation

## 2015-12-15 DIAGNOSIS — R45851 Suicidal ideations: Secondary | ICD-10-CM

## 2015-12-15 DIAGNOSIS — F10120 Alcohol abuse with intoxication, uncomplicated: Secondary | ICD-10-CM | POA: Insufficient documentation

## 2015-12-15 DIAGNOSIS — F17210 Nicotine dependence, cigarettes, uncomplicated: Secondary | ICD-10-CM | POA: Insufficient documentation

## 2015-12-15 DIAGNOSIS — F1020 Alcohol dependence, uncomplicated: Secondary | ICD-10-CM | POA: Diagnosis present

## 2015-12-15 DIAGNOSIS — S01112A Laceration without foreign body of left eyelid and periocular area, initial encounter: Secondary | ICD-10-CM | POA: Insufficient documentation

## 2015-12-15 DIAGNOSIS — F10920 Alcohol use, unspecified with intoxication, uncomplicated: Secondary | ICD-10-CM

## 2015-12-15 HISTORY — DX: Bipolar disorder, unspecified: F31.9

## 2015-12-15 LAB — COMPREHENSIVE METABOLIC PANEL
ALT: 36 U/L (ref 17–63)
AST: 45 U/L — ABNORMAL HIGH (ref 15–41)
Albumin: 5.3 g/dL — ABNORMAL HIGH (ref 3.5–5.0)
Alkaline Phosphatase: 61 U/L (ref 38–126)
Anion gap: 12 (ref 5–15)
BUN: 15 mg/dL (ref 6–20)
CO2: 21 mmol/L — ABNORMAL LOW (ref 22–32)
Calcium: 8.9 mg/dL (ref 8.9–10.3)
Chloride: 109 mmol/L (ref 101–111)
Creatinine, Ser: 1.02 mg/dL (ref 0.61–1.24)
GFR calc Af Amer: >60 mL/min (ref >60)
GFR calc non Af Amer: >60 mL/min (ref >60)
Glucose, Bld: 77 mg/dL (ref 65–99)
Potassium: 3.4 mmol/L — ABNORMAL LOW (ref 3.5–5.1)
Sodium: 142 mmol/L (ref 135–145)
Total Bilirubin: 0.9 mg/dL (ref 0.3–1.2)
Total Protein: 8.7 g/dL — ABNORMAL HIGH (ref 6.5–8.1)

## 2015-12-15 LAB — RAPID URINE DRUG SCREEN, HOSP PERFORMED
AMPHETAMINES: NOT DETECTED
BENZODIAZEPINES: NOT DETECTED
Barbiturates: NOT DETECTED
Cocaine: NOT DETECTED
Opiates: NOT DETECTED
Tetrahydrocannabinol: POSITIVE — AB

## 2015-12-15 LAB — ACETAMINOPHEN LEVEL: Acetaminophen (Tylenol), Serum: <10 ug/mL — ABNORMAL LOW (ref 10–30)

## 2015-12-15 LAB — SALICYLATE LEVEL: Salicylate Lvl: <4 mg/dL (ref 2.8–30.0)

## 2015-12-15 LAB — CBC
HCT: 43.4 % (ref 39.0–52.0)
Hemoglobin: 15.4 g/dL (ref 13.0–17.0)
MCH: 33.7 pg (ref 26.0–34.0)
MCHC: 35.5 g/dL (ref 30.0–36.0)
MCV: 95 fL (ref 78.0–100.0)
PLATELETS: 287 10*3/uL (ref 150–400)
RBC: 4.57 MIL/uL (ref 4.22–5.81)
RDW: 12.6 % (ref 11.5–15.5)
WBC: 11.9 10*3/uL — AB (ref 4.0–10.5)

## 2015-12-15 LAB — ETHANOL: ALCOHOL ETHYL (B): 375 mg/dL — AB (ref <5)

## 2015-12-15 MED ORDER — LORAZEPAM 1 MG PO TABS: 1.0000 mg | ORAL_TABLET | Freq: Every day | ORAL | Status: DC

## 2015-12-15 MED ORDER — LORAZEPAM 1 MG PO TABS: 1.0000 mg | ORAL_TABLET | ORAL | Status: DC | PRN

## 2015-12-15 MED ORDER — BENZTROPINE MESYLATE 1 MG PO TABS
0.5000 mg | ORAL_TABLET | Freq: Two times a day (BID) | ORAL | Status: DC
  Administered 2015-12-15 (×2): 0.5 mg via ORAL
  Filled 2015-12-15 (×2): qty 1

## 2015-12-15 MED ORDER — OLANZAPINE 5 MG PO TABS
5.0000 mg | ORAL_TABLET | Freq: Every day | ORAL | Status: DC
  Administered 2015-12-15: 5 mg via ORAL
  Filled 2015-12-15: qty 1

## 2015-12-15 MED ORDER — ALUM & MAG HYDROXIDE-SIMETH 200-200-20 MG/5ML PO SUSP: 30.0000 mL | ORAL | Status: DC | PRN

## 2015-12-15 MED ORDER — IBUPROFEN 200 MG PO TABS
400.0000 mg | ORAL_TABLET | Freq: Three times a day (TID) | ORAL | Status: DC | PRN
  Administered 2015-12-15: 400 mg via ORAL
  Filled 2015-12-15: qty 2

## 2015-12-15 MED ORDER — ONDANSETRON HCL 4 MG PO TABS: 4.0000 mg | ORAL_TABLET | Freq: Three times a day (TID) | ORAL | Status: DC | PRN

## 2015-12-15 MED ORDER — LORAZEPAM 1 MG PO TABS
1.0000 mg | ORAL_TABLET | Freq: Once | ORAL | Status: DC
  Filled 2015-12-15: qty 1

## 2015-12-15 MED ORDER — HYDROXYZINE HCL 25 MG PO TABS: 25.0000 mg | ORAL_TABLET | Freq: Four times a day (QID) | ORAL | Status: DC | PRN

## 2015-12-15 MED ORDER — DIVALPROEX SODIUM ER 250 MG PO TB24
250.0000 mg | ORAL_TABLET | Freq: Two times a day (BID) | ORAL | Status: DC
  Administered 2015-12-15 (×2): 250 mg via ORAL
  Filled 2015-12-15 (×2): qty 1

## 2015-12-15 MED ORDER — LORAZEPAM 1 MG PO TABS: 1.0000 mg | ORAL_TABLET | Freq: Three times a day (TID) | ORAL | Status: DC

## 2015-12-15 MED ORDER — ACETAMINOPHEN 325 MG PO TABS: 650.0000 mg | ORAL_TABLET | ORAL | Status: DC | PRN

## 2015-12-15 MED ORDER — VITAMIN B-1 100 MG PO TABS: 100.0000 mg | ORAL_TABLET | Freq: Every day | ORAL | Status: DC

## 2015-12-15 MED ORDER — LOPERAMIDE HCL 2 MG PO CAPS: 2.0000 mg | ORAL_CAPSULE | ORAL | Status: DC | PRN

## 2015-12-15 MED ORDER — LORAZEPAM 1 MG PO TABS: 1.0000 mg | ORAL_TABLET | Freq: Four times a day (QID) | ORAL | Status: DC | PRN

## 2015-12-15 MED ORDER — LORAZEPAM 1 MG PO TABS
1.0000 mg | ORAL_TABLET | Freq: Four times a day (QID) | ORAL | Status: AC
  Administered 2015-12-15 (×4): 1 mg via ORAL
  Filled 2015-12-15 (×3): qty 1

## 2015-12-15 MED ORDER — THIAMINE HCL 100 MG/ML IJ SOLN
100.0000 mg | Freq: Once | INTRAMUSCULAR | Status: AC
  Administered 2015-12-15: 100 mg via INTRAMUSCULAR
  Filled 2015-12-15: qty 2

## 2015-12-15 MED ORDER — LORAZEPAM 1 MG PO TABS: 1.0000 mg | ORAL_TABLET | Freq: Two times a day (BID) | ORAL | Status: DC

## 2015-12-15 MED ORDER — ONDANSETRON 4 MG PO TBDP: 4.0000 mg | ORAL_TABLET | Freq: Four times a day (QID) | ORAL | Status: DC | PRN

## 2015-12-15 MED ORDER — ADULT MULTIVITAMIN W/MINERALS CH
1.0000 | ORAL_TABLET | Freq: Every day | ORAL | Status: DC
  Administered 2015-12-15: 1 via ORAL
  Filled 2015-12-15: qty 1
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
## 2015-12-15 NOTE — ED Notes (Signed)
Pt has $162 cash. Security called to lock it up.
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
## 2015-12-15 NOTE — ED Notes (Signed)
Pt. In burgundy scrubs. Pt. And belongings searched and wanded by security. Pt. Has 1 belongings bag. Pt. Has 1 pr. Black shoes, 1 white socks, 1 gray shirt and 1 black pants. Pt. Belongings locked up at the nurses station in the yellow zone between 9-12.

## 2015-12-15 NOTE — ED Notes (Signed)
Bed: [id number] Expected date:  Expected time:  Means of arrival:  Comments: Hold for rm 12

## 2015-12-15 NOTE — ED Notes (Signed)
Adam JunesBrandon is polite and pleasant on approach.  His speech is rapid, but not pressured or disorganized, however he is a bit tangential.  He states repeatedly that he is an addict and that is why he drinks every day.  He was started on lorazepam.  He has requested sleep medication, but I have encouraged him to stay awake during the day and we will see about getting him something to help with sleep this evening.  Patient verbalized understanding and agreement to the plan.

## 2015-12-15 NOTE — ED Notes (Signed)
Pt awake, alert & responsive, no distress noted, calm & cooperative at present,  [facility] and [facility] [facility],  Monitoring for safety, Q 15 min checks in effect.
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
## 2015-12-15 NOTE — ED Notes (Signed)
Pt requested and given a pitcher of water,.
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
## 2015-12-15 NOTE — ED Notes (Signed)
Bed: [id number] Expected date:  Expected time:  Means of arrival:  Comments: TR 4

## 2015-12-15 NOTE — ED Notes (Signed)
Patient's sitter states the patient is starting to feel slightly anxious. Pt is currently on the phone with his mother and this seems to be agitating the patient. Patient has began crying, off the phone now. Notified TTS team of need for anxiety medication. Patient not violent or threatening acts of violence at this time. Requesting anxiety medication from TTS team, they stated they would review the patient's chart. If anxiety persists or increases will notify EDP to get medication for patient.

## 2015-12-15 NOTE — ED Provider Notes (Signed)
Patient is alert and nontoxic. He has no acute complaints. He reports he has alcohol withdrawal but he is being treated. He is cooperative and without confusion or tremor. Patient is medically cleared for transfer to behavioral health with Dr. Jannifer FranklinAkintayo excepting.  Arby BarretteMarcy Deacon Gadbois, MD 12/18/15 [phone]

## 2015-12-15 NOTE — BH Assessment (Signed)
[facility] Assessment Progress Note  Per Thedore MinsMojeed Akintayo, MD, this pt requires psychiatric hospitalization at this time.  Berneice Heinrichina Tate, RN, [facility]; they will be ready to receive pt after 19:00.  Pt presents under IVC, which has been upheld by Dr Jannifer FranklinAkintayo; [facility]  [name] nurse, Rudean HittDawnaly, has been notified, and agrees to call report to [phone].  Pt is to be transported via Patent examinerlaw enforcement.  Adam Canninghomas Jesilyn Easom, MA Triage Specialist [phone]

## 2015-12-15 NOTE — ED Notes (Signed)
MD at bedside at this time.

## 2015-12-16 ENCOUNTER — Inpatient Hospital Stay (HOSPITAL_COMMUNITY)
Admission: AD | Admit: 2015-12-16 | Discharge: 2015-12-19 | DRG: 897 | Disposition: A | Discharge status: Home / Self Care | Payer: Self-pay | Source: Intra-hospital | Attending: Psychiatry | Admitting: Psychiatry

## 2015-12-16 ENCOUNTER — Encounter (HOSPITAL_COMMUNITY): Payer: Self-pay | Admitting: Emergency Medicine

## 2015-12-16 DIAGNOSIS — E876 Hypokalemia: Secondary | ICD-10-CM | POA: Diagnosis present

## 2015-12-16 DIAGNOSIS — F410 Panic disorder [episodic paroxysmal anxiety] without agoraphobia: Secondary | ICD-10-CM | POA: Diagnosis present

## 2015-12-16 DIAGNOSIS — R45851 Suicidal ideations: Secondary | ICD-10-CM

## 2015-12-16 DIAGNOSIS — F17210 Nicotine dependence, cigarettes, uncomplicated: Secondary | ICD-10-CM | POA: Diagnosis present

## 2015-12-16 DIAGNOSIS — F1024 Alcohol dependence with alcohol-induced mood disorder: Principal | ICD-10-CM | POA: Diagnosis present

## 2015-12-16 DIAGNOSIS — G4700 Insomnia, unspecified: Secondary | ICD-10-CM | POA: Diagnosis present

## 2015-12-16 DIAGNOSIS — F319 Bipolar disorder, unspecified: Secondary | ICD-10-CM | POA: Diagnosis present

## 2015-12-16 MED ORDER — MAGNESIUM HYDROXIDE 400 MG/5ML PO SUSP: 30.0000 mL | Freq: Every day | ORAL | Status: DC | PRN

## 2015-12-16 MED ORDER — THIAMINE HCL 100 MG/ML IJ SOLN: 100.0000 mg | Freq: Once | INTRAMUSCULAR | Status: DC

## 2015-12-16 MED ORDER — POTASSIUM CHLORIDE CRYS ER 20 MEQ PO TBCR
40.0000 meq | EXTENDED_RELEASE_TABLET | Freq: Once | ORAL | Status: AC
  Administered 2015-12-16: 40 meq via ORAL
  Filled 2015-12-16: qty 2

## 2015-12-16 MED ORDER — DIVALPROEX SODIUM ER 500 MG PO TB24
500.0000 mg | ORAL_TABLET | Freq: Every day | ORAL | Status: DC
  Administered 2015-12-16 – 2015-12-18 (×3): 500 mg via ORAL
  Filled 2015-12-16: qty 7
  Filled 2015-12-16 (×4): qty 1

## 2015-12-16 MED ORDER — LOPERAMIDE HCL 2 MG PO CAPS
2.0000 mg | ORAL_CAPSULE | ORAL | Status: AC | PRN
  Administered 2015-12-17: 4 mg via ORAL
  Filled 2015-12-16: qty 2

## 2015-12-16 MED ORDER — VITAMIN B-1 100 MG PO TABS
100.0000 mg | ORAL_TABLET | Freq: Every day | ORAL | Status: DC
  Administered 2015-12-17 – 2015-12-19 (×3): 100 mg via ORAL
  Filled 2015-12-16 (×5): qty 1

## 2015-12-16 MED ORDER — LORAZEPAM 1 MG PO TABS
1.0000 mg | ORAL_TABLET | Freq: Four times a day (QID) | ORAL | Status: AC | PRN
  Administered 2015-12-16 – 2015-12-18 (×3): 1 mg via ORAL
  Filled 2015-12-16 (×3): qty 1

## 2015-12-16 MED ORDER — OLANZAPINE 5 MG PO TABS
5.0000 mg | ORAL_TABLET | Freq: Every day | ORAL | Status: DC
  Administered 2015-12-16 – 2015-12-18 (×3): 5 mg via ORAL
  Filled 2015-12-16 (×3): qty 1
  Filled 2015-12-16: qty 7
  Filled 2015-12-16: qty 1

## 2015-12-16 MED ORDER — ACETAMINOPHEN 325 MG PO TABS
650.0000 mg | ORAL_TABLET | Freq: Four times a day (QID) | ORAL | Status: DC | PRN
  Administered 2015-12-17 – 2015-12-18 (×4): 650 mg via ORAL
  Filled 2015-12-16 (×4): qty 2

## 2015-12-16 MED ORDER — ADULT MULTIVITAMIN W/MINERALS CH
1.0000 | ORAL_TABLET | Freq: Every day | ORAL | Status: DC
  Administered 2015-12-16 – 2015-12-19 (×4): 1 via ORAL
  Filled 2015-12-16 (×7): qty 1

## 2015-12-16 MED ORDER — ALUM & MAG HYDROXIDE-SIMETH 200-200-20 MG/5ML PO SUSP: 30.0000 mL | ORAL | Status: DC | PRN

## 2015-12-16 MED ORDER — HYDROXYZINE HCL 25 MG PO TABS
25.0000 mg | ORAL_TABLET | Freq: Four times a day (QID) | ORAL | Status: AC | PRN
  Administered 2015-12-16 – 2015-12-19 (×6): 25 mg via ORAL
  Filled 2015-12-16 (×6): qty 1
  Filled 2015-12-16: qty 10

## 2015-12-16 MED ORDER — ONDANSETRON 4 MG PO TBDP: 4.0000 mg | ORAL_TABLET | Freq: Four times a day (QID) | ORAL | Status: AC | PRN

## 2015-12-16 NOTE — Progress Notes (Signed)
Patient signed voluntary form this afternoon. 

## 2015-12-16 NOTE — ED Notes (Signed)
[facility] at bedside to transport pt to [facility] 

## 2015-12-16 NOTE — ED Notes (Signed)
[employer] transport requested. 

## 2015-12-16 NOTE — Tx Team (Signed)
Interdisciplinary Treatment Plan Update (Adult) Date: 12/16/2015   Date: 12/16/2015 9:44 AM  Progress in Treatment:  Attending groups: Pt is new to milieu, continuing to assess   Participating in groups: Pt is new to milieu, continuing to assess   Taking medication as prescribed: Yes  Tolerating medication: Yes  Family/Significant othe contact made: No, CSW assessing for appropriate contact Patient understands diagnosis: Continuing to assess Discussing patient identified problems/goals with staff: Yes  Medical problems stabilized or resolved: Yes  Denies suicidal/homicidal ideation: Recently admitted with SI Patient has not harmed self or Others: Yes   New problem(s) identified: None identified at this time.   Discharge Plan or Barriers: CSW will assess for appropriate discharge plan and relevant barriers.   Additional comments:  Patient and CSW reviewed pt's identified goals and treatment plan. Patient verbalized understanding and agreed to treatment plan.   Reason for Continuation of Hospitalization:  Depression Medication stabilization Suicidal ideation  Estimated length of stay: 3-5 days  Review of initial/current patient goals per problem list:   1.  Goal(s): Patient will participate in aftercare plan  Met:  No  Target date: 3-5 days from date of admission   As evidenced by: Patient will participate within aftercare plan AEB aftercare provider and housing plan at discharge being identified.  12/16/15: Pt will return home and follow-up with outpatient services.   2.  Goal (s): Patient will exhibit decreased depressive symptoms and suicidal ideations.  Met:  No  Target date: 3-5 days from date of admission   As evidenced by: Patient will utilize self rating of depression at 3 or below and demonstrate decreased signs of depression or be deemed stable for discharge by MD. 12/16/15: Pt was admitted with symptoms of depression, rating 10/10. Pt continues to present with flat  affect and depressive symptoms.  Pt will demonstrate decreased symptoms of depression and rate depression at 3/10 or lower prior to discharge.  Attendees:  Patient:    Family:    Physician: Dr. Parke Poisson, MD  12/16/2015 9:44 AM  Nursing: Grayland Ormond, RN 12/16/2015 9:44 AM  Clinical Social Worker Peri Maris, [place] 12/16/2015 9:44 AM  Other: Tilden Fossa, [place] 12/16/2015 9:44 AM  Clinical:  12/16/2015 9:44 AM  Other:  12/16/2015 9:44 AM  Other:     Peri Maris, [place] Social Work [phone]

## 2015-12-16 NOTE — ED Notes (Signed)
Report called to RN Jamison OkaPam Sadler, [facility].  Unit will call when ready for pt.  Pending [employer] transport.

## 2015-12-16 NOTE — Progress Notes (Signed)
EKG completed and MD reviewed.

## 2015-12-16 NOTE — Progress Notes (Signed)
Adam Novak has been asleep most of the evening. He awoke to take his pm medications. He rates Anxiety 7/10 and Depression 5/10. His goal today was to "try to think clearer and focus on what I need to do while I'm here". Denies SI/HI/AVH. Encouragement and support given. Medications administered as prescribed. Continue Q 15 minute checks for patient safety and medication effectiveness.
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
## 2015-12-16 NOTE — Tx Team (Signed)
Initial Interdisciplinary Treatment Plan   PATIENT STRESSORS: Financial difficulties Occupational concerns Substance abuse   PATIENT STRENGTHS: Ability for insight Communication skills Motivation for treatment/growth   PROBLEM LIST: Problem List/Patient Goals Date to be addressed Date deferred Reason deferred Estimated date of resolution  "work on my anxiety and depression"      Substance Abuse      Risk for suicide      Anxiety      Depression                               DISCHARGE CRITERIA:  Adequate post-discharge living arrangements Improved stabilization in mood, thinking, and/or behavior Withdrawal symptoms are absent or subacute and managed without 24-hour nursing intervention  PRELIMINARY DISCHARGE PLAN: Return to previous living arrangement Return to previous work or school arrangements  PATIENT/FAMIILY INVOLVEMENT: This treatment plan has been presented to and reviewed with the patient, Adam Novak.  The patient and family have been given the opportunity to ask questions and make suggestions.  Adam Novak 12/16/2015, 3:03 AM

## 2015-12-16 NOTE — Progress Notes (Signed)
Patient did not attend group.

## 2015-12-16 NOTE — Progress Notes (Signed)
Recreation Therapy Notes  Date: 07.07.2017 Time: 9:30am Location: [address] Group Room   Group Topic: Stress Management  Goal Area(s) Addresses:  Patient will actively participate in stress management techniques presented during session.   Behavioral Response: Did not attend.   Marykay Lexenise L Ailin Rochford, LRT/CTRS        Madelene Kaatz L 12/16/2015 11:46 AM
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
## 2015-12-16 NOTE — Plan of Care (Signed)
Problem: Education: Goal: Utilization of techniques to improve thought processes will improve Outcome: Progressing Nurse discussed depression/coping skills with patient.    

## 2015-12-16 NOTE — [facility] Group Notes (Signed)
[facility]  12/16/2015 8:45 AM  Pt did not attend, declined invitation.   Chad CordialLauren Carter, LCSWA 12/16/2015 9:43 AM

## 2015-12-17 LAB — LIPID PANEL
CHOLESTEROL: 245 mg/dL — AB (ref 0–200)
HDL: 65 mg/dL (ref >40)
LDL CALC: 148 mg/dL — AB (ref 0–99)
Total CHOL/HDL Ratio: 3.8 RATIO
Triglycerides: 161 mg/dL — ABNORMAL HIGH (ref <150)
VLDL: 32 mg/dL (ref 0–40)

## 2015-12-17 NOTE — [facility] Group Notes (Signed)
[facility] LCSW Group Therapy  12/17/2015 10:15 to 11:15 AM  Type of Therapy:  Group Therapy  Participation Level:  Did Not Attend The main focus of today's process group was for the patient to identify ways in which they have in the past sabotaged their own recovery. Motivational Interviewing was utilized to ask the group members what they get out of their self sabotaging behavior(s), and what reasons they may have for wanting to change. The Stages of Change were explained using a handout, and patients identified where they currently are with regard to stages of change. Summary of Progress/Problems:   Clide DalesHarrill, Catherine Campbell 12/17/2015, 1:20 PM

## 2015-12-17 NOTE — Plan of Care (Signed)
Problem: Education: Goal: Ability to make informed decisions regarding treatment will improve Outcome: Progressing Nurse discussed suicidal thoughts/depression/coping skills with patient.        

## 2015-12-17 NOTE — Progress Notes (Signed)
[facility] MD Progress Note  12/17/2015 1:34 PM Adam Novak  MRN:  [id number] Subjective:  " Last night I didn't sleep well. I think I got about 2 hours. Im doing well., my withdrawal symptoms are better than before. Im not a sociable person, I never have been. I dont like talking to people. "   Objective: Adam Novak is a 25 year old male who presents to [facility] via EMS, after making suicidal threats. Steel states he is trying to get things together. He states he has Bipolar Disorder and is not a social person and does not like attending groups. Discussed with patient that he needs to attend the group in order to progress through is treatment plan. He had a recent admission in December 2016, and is familiar with milieu and therapy on the unit. He is encouraged to participate and work towards treatment. He endorses a poor sleeping habit, this has been an ongoing problem prior to admission. He also notes a fair appetite but states he is going to skip lunch because he wants to lose weight. He denies any suicidal, homicidal, hallucinations, psychosis, and does not appear to be responding to internal stimuli. He is tolerating his medication well at this time.    Principal Problem: Alcohol dependence with alcohol-induced mood disorder (HCC) Diagnosis:   Patient Active Problem List   Diagnosis Date Noted  . Bipolar 1 disorder (HCC) [F31.9] 12/16/2015  . Alcohol dependence with alcohol-induced mood disorder (HCC) [F10.24]   . Alcohol use disorder, severe, dependence (HCC) [F10.20] 12/15/2015  . Bipolar I disorder, most recent episode depressed (HCC) [F31.30] 05/15/2015  . Dog bite of left calf [S81.852A, W54.0XXA] 06/19/2014  . Alcohol abuse [F10.10] 06/18/2014  . Alcohol withdrawal (HCC) [F10.239] 06/18/2014  . Alcohol intoxication, episodic (HCC) [F10.129] 06/18/2014  . Encephalopathy [G93.40] 06/18/2014  . Leukocytosis [D72.829]   . Polysubstance abuse [F19.10]   . CLOSED FRACTURE OF NECK OF METACARPAL  BONE [S62.339A] 06/16/2010   Total Time spent with patient: 30 minutes  Past Psychiatric History: see admission H and P  Past Medical History:  Past Medical History  Diagnosis Date  . Bipolar 1 disorder [facility])     Past Surgical History  Procedure Laterality Date  . Knee surgery Right    Family History: History reviewed. No pertinent family history. Family Psychiatric  History: see admission H and P Social History:  History  Alcohol Use  . Yes    Comment: socially     History  Drug Use  . Yes  . Special: Marijuana, Benzodiazepines    Social History   Social History  . Marital Status: Single    Spouse Name: N/A  . Number of Children: N/A  . Years of Education: N/A   Social History Main Topics  . Smoking status: Current Some Day Smoker -- 0.50 packs/day    Types: Cigarettes  . Smokeless tobacco: None  . Alcohol Use: Yes     Comment: socially  . Drug Use: Yes    Special: Marijuana, Benzodiazepines  . Sexual Activity: No   Other Topics Concern  . None   Social History Narrative   Additional Social History:     Sleep: Fair  Appetite:  Fair  Current Medications: Current Facility-Administered Medications  Medication Dose Route Frequency Provider Last Rate Last Dose  . acetaminophen (TYLENOL) tablet 650 mg  650 mg Oral Q6H PRN Craige Cotta, MD   650 mg at 12/17/15 0854  . alum & mag hydroxide-simeth (MAALOX/MYLANTA) 200-200-20 MG/5ML suspension  30 mL  30 mL Oral Q4H PRN Craige Cotta, MD      . divalproex (DEPAKOTE ER) 24 hr tablet 500 mg  500 mg Oral QHS Adonis Brook, NP   500 mg at 12/16/15 2125  . hydrOXYzine (ATARAX/VISTARIL) tablet 25 mg  25 mg Oral Q6H PRN Adonis Brook, NP   25 mg at 12/17/15 0854  . loperamide (IMODIUM) capsule 2-4 mg  2-4 mg Oral PRN Adonis Brook, NP   4 mg at 12/17/15 0857  . LORazepam (ATIVAN) tablet 1 mg  1 mg Oral Q6H PRN Adonis Brook, NP   1 mg at 12/16/15 2125  . magnesium hydroxide (MILK OF MAGNESIA) suspension 30  mL  30 mL Oral Daily PRN Craige Cotta, MD      . multivitamin with minerals tablet 1 tablet  1 tablet Oral Daily Adonis Brook, NP   1 tablet at 12/17/15 0850  . OLANZapine (ZYPREXA) tablet 5 mg  5 mg Oral QHS Adonis Brook, NP   5 mg at 12/16/15 2125  . ondansetron (ZOFRAN-ODT) disintegrating tablet 4 mg  4 mg Oral Q6H PRN Adonis Brook, NP      . thiamine (B-1) injection 100 mg  100 mg Intramuscular Once Adonis Brook, NP   100 mg at 12/16/15 1628  . thiamine (VITAMIN B-1) tablet 100 mg  100 mg Oral Daily Adonis Brook, NP   100 mg at 12/17/15 1610    Lab Results:  Results for orders placed or performed during the hospital encounter of 12/16/15 (from the past 48 hour(s))  Lipid panel     Status: Abnormal   Collection Time: 12/17/15  6:18 AM  Result Value Ref Range   Cholesterol 245 (H) 0 - 200 mg/dL   Triglycerides 960 (H) <150 mg/dL   HDL 65 >45 mg/dL   Total CHOL/HDL Ratio 3.8 RATIO   VLDL 32 0 - 40 mg/dL   LDL Cholesterol 409 (H) 0 - 99 mg/dL    Comment:        Total Cholesterol/HDL:CHD Risk Coronary Heart Disease Risk Table                     Men   Women  1/2 Average Risk   3.4   3.3  Average Risk       5.0   4.4  2 X Average Risk   9.6   7.1  3 X Average Risk  23.4   11.0        Use the calculated Patient Ratio above and the CHD Risk Table to determine the patient's CHD Risk.        ATP III CLASSIFICATION (LDL):  <100     mg/dL   Optimal  811-914  mg/dL   Near or Above                    Optimal  130-159  mg/dL   Borderline  782-956  mg/dL   High  >213     mg/dL   Very High Performed at [facility]     Physical Findings: AIMS:  , ,  ,  ,    CIWA:  CIWA-Ar Total: 9 COWS:  COWS Total Score: 2  Musculoskeletal: Strength & Muscle Tone: within normal limits Gait & Station: normal Patient leans: normal  Psychiatric Specialty Exam: Review of Systems  Constitutional: Negative.   HENT: Negative.   Eyes: Negative.   Respiratory: Negative.    Cardiovascular: Negative.  Gastrointestinal: Negative.   Genitourinary: Negative.   Musculoskeletal: Negative.   Skin: Negative.   Neurological: Negative.   Endo/Heme/Allergies: Negative.   Psychiatric/Behavioral: Positive for depression and substance abuse. The patient is nervous/anxious.     Blood pressure 142/80, pulse 89, temperature 98.4 F (36.9 C), temperature source Oral, resp. rate 16, height 5\' 9"  (1.753 m), weight 89.359 kg (197 lb), SpO2 100 %.Body mass index is 29.08 kg/(m^2).  General Appearance: Fairly Groomed  Patent attorneyye Contact::  Fair  Speech:  Clear and Coherent  Volume:  fluctuates  Mood:  Anxious, Depressed and Irritable  Affect:  Labile  Thought Process:  Coherent and Goal Directed  Orientation:  Full (Time, Place, and Person)  Thought Content:  symptoms events worries concerns  Suicidal Thoughts:  No  Homicidal Thoughts:  No  Memory:  Immediate;   Fair Recent;   Fair Remote;   Fair  Judgement:  Fair  Insight:  Present  Psychomotor Activity:  Restlessness  Concentration:  Fair  Recall:  FiservFair  Fund of Knowledge:Fair  Language: Fair  Akathisia:  No  Handed:  Right  AIMS (if indicated):     Assets:  Desire for Improvement  ADL's:  Intact  Cognition: WNL  Sleep:  Number of Hours: 6.75   Treatment Plan Summary: Daily contact with patient to assess and evaluate symptoms and progress in treatment and Medication management   1. Will maintain Q 15 minutes observation for safety. Estimated LOS: 5-7 days 2. Patient will participate in group, milieu, and family therapy. Psychotherapy: Social and Doctor, hospitalcommunication skill training, anti-bullying, learning based strategies, cognitive behavioral, and family object relations individuation separation intervention psychotherapies can be considered.  3.  Medications: -Potassium 40 once hypokalemia -Zyprexa 5mg  qhs for mood stabilization -Depakote ER 500 mg mood sx -Ativan withdrawal protocol  Labs/tests: -Bal 375,  UDS + for benzo/THC -Reviewed CBC, CMP K= 3.4; AST 45; WBC 11.9 (high), asymptomatic, may be acute stress response as neutrophils are WNL 4. Will continue to monitor patient's mood and behavior. 5. Social Work will schedule a Family meeting to obtain collateral information and discuss discharge and follow up plan. Discharge concerns will also be addressed: Safety, stabilization, and access to medication  Truman Haywardakia S Starkes 12/17/2015, 1:34 PM  Physical Exam  I reviewed chart and agreed with the findings and treatment Plan.  Kathryne SharperSyed Arfeen, MD
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
## 2015-12-17 NOTE — [facility] Group Notes (Addendum)
The focus of this group is to educate the patient on the purpose and policies of crisis stabilization and provide a format to answer questions about their admission.  The group details unit policies and expectations of patients while admitted.  Patient did not attend 0900 nurse education orientation group this morning.  Patient stayed in bed.   

## 2015-12-18 NOTE — [facility] Group Notes (Signed)
The focus of this group is to educate the patient on the purpose and policies of crisis stabilization and provide a format to answer questions about their admission.  The group details unit policies and expectations of patients while admitted.  Patient did not attend 0900 nurse education orientation group this morning.  Patient stayed in bed.   

## 2015-12-18 NOTE — Progress Notes (Signed)
Patient has been up and active on the unit, attended group this evening and reported to writer that he did not sleep well on last evening.  He did report that he did not take a nap today and is hopeful that he will sleep tonight. His goal is to remain on his medications and not to self- medicate. Patient currently denies having pain, -si/hi/a/v hall. Support and encouragement offered, safety maintained on unit, will continue to monitor.

## 2015-12-18 NOTE — [facility] Group Notes (Signed)
[facility] LCSW Group Therapy  12/18/2015 10:00 AM  Type of Therapy:  Group Therapy and Activity to Identify signs of Improvement or Decompensation   Participation Level:  Did Not Attend despite overhead announcement and CSW knock on his rioom door with encouragement to attend.    Carney Bernatherine C Harrill, LCSW

## 2015-12-18 NOTE — Progress Notes (Signed)
Adult Psychoeducational Group Note  Date:  12/18/2015 Time:  2:05 AM  Group Topic/Focus:  Wellness Toolbox:   The focus of this group is to discuss various aspects of wellness, balancing those aspects and exploring ways to increase the ability to experience wellness.  Patients will create a wellness toolbox for use upon discharge.  Participation Level:  Active  Participation Quality:  Appropriate  Affect:  Appropriate and Flat  Cognitive:  Alert, Appropriate and Oriented  Insight: Limited  Engagement in Group:  Engaged  Modes of Intervention:  Discussion and Support  Additional Comments:  Patient attended group and he described his day as a 8.  He wishes to use drawing, basketball and coloring as his coping skills.   Truitt Cruey W Cameron Schwinn 12/18/2015, 2:05 AM

## 2015-12-18 NOTE — Progress Notes (Signed)
[facility]  12/18/2015 11:36 AM Adele DanBrandon J Macknight  MRN:  [id number] Subjective:  " Im glad I listened to you. Going to group was changing for me. I actually volunteered to speak first and talk about my problems. I went to group and the gym and had a good day. I cant believe it. I was having trouble sleeping, but last night was better. Im not having any withdrawals, feel good feel positive. It made me feel better talking to people. Meriam SpragueBeverly is giving me some good advice and insight. Im thinking about college and [employer]. "   Objective: [name] is a 25 year old male who [facility], after making suicidal threats. [name] states he is trying to get things together. He states he notes improvement since attending groups and engaging with his peers and staff. Discussed with patient that he needs to continue to attend the group in order to progress through is treatment plan.  He endorses improvement with sleeping habits. He also notes a fair appetite and went to breakfast this morning. He denies any suicidal, homicidal, hallucinations, psychosis, and does not appear to be responding to internal stimuli. He is tolerating his medication well at this time.    Principal Problem: Alcohol dependence with alcohol-induced mood disorder (HCC) Diagnosis:   Patient Active Problem List   Diagnosis Date Noted  . Bipolar 1 disorder (HCC) [F31.9] 12/16/2015  . Alcohol dependence with alcohol-induced mood disorder (HCC) [F10.24]   . Alcohol use disorder, severe, dependence (HCC) [F10.20] 12/15/2015  . Bipolar I disorder, most recent episode depressed (HCC) [F31.30] 05/15/2015  . Dog bite of left calf [S81.852A, W54.0XXA] 06/19/2014  . Alcohol abuse [F10.10] 06/18/2014  . Alcohol withdrawal (HCC) [F10.239] 06/18/2014  . Alcohol intoxication, episodic (HCC) [F10.129] 06/18/2014  . Encephalopathy [G93.40] 06/18/2014  . Leukocytosis [D72.829]   . Polysubstance abuse [F19.10]   . CLOSED FRACTURE OF  NECK OF METACARPAL BONE [S62.339A] 06/16/2010   Total Time spent with patient: 30 minutes  Past Psychiatric History: see admission H and P  Past Medical History:  Past Medical History  Diagnosis Date  . Bipolar 1 disorder [facility](HCC)     Past Surgical History  Procedure Laterality Date  . Knee surgery Right    Family History: History reviewed. No pertinent family history. Family Psychiatric  History: see admission H and P Social History:  History  Alcohol Use  . Yes    Comment: socially     History  Drug Use  . Yes  . Special: Marijuana, Benzodiazepines    Social History   Social History  . Marital Status: Single    Spouse Name: N/A  . Number of Children: N/A  . Years of Education: N/A   Social History Main Topics  . Smoking status: Current Some Day Smoker -- 0.50 packs/day    Types: Cigarettes  . Smokeless tobacco: None  . Alcohol Use: Yes     Comment: socially  . Drug Use: Yes    Special: Marijuana, Benzodiazepines  . Sexual Activity: No   Other Topics Concern  . None   Social History Narrative   Additional Social History:     Sleep: Fair  Appetite:  Fair  Current Medications: Current Facility-Administered Medications  Medication Dose Route Frequency Provider Last Rate Last Dose  . acetaminophen (TYLENOL) tablet 650 mg  650 mg Oral Q6H PRN Craige CottaFernando A Cobos, MD   650 mg at 12/18/15 0825  . alum & mag hydroxide-simeth (MAALOX/MYLANTA) 200-200-20 MG/5ML suspension  30 mL  30 mL Oral Q4H PRN Craige Cotta, MD      . divalproex (DEPAKOTE ER) 24 hr tablet 500 mg  500 mg Oral QHS Adonis Brook, NP   500 mg at 12/17/15 2143  . hydrOXYzine (ATARAX/VISTARIL) tablet 25 mg  25 mg Oral Q6H PRN Adonis Brook, NP   25 mg at 12/18/15 0829  . loperamide (IMODIUM) capsule 2-4 mg  2-4 mg Oral PRN Adonis Brook, NP   4 mg at 12/17/15 0857  . LORazepam (ATIVAN) tablet 1 mg  1 mg Oral Q6H PRN Adonis Brook, NP   1 mg at 12/17/15 2143  . magnesium hydroxide (MILK OF  MAGNESIA) suspension 30 mL  30 mL Oral Daily PRN Craige Cotta, MD      . multivitamin with minerals tablet 1 tablet  1 tablet Oral Daily Adonis Brook, NP   1 tablet at 12/18/15 [phone]  . OLANZapine (ZYPREXA) tablet 5 mg  5 mg Oral QHS Adonis Brook, NP   5 mg at 12/17/15 2143  . ondansetron (ZOFRAN-ODT) disintegrating tablet 4 mg  4 mg Oral Q6H PRN Adonis Brook, NP      . thiamine (B-1) injection 100 mg  100 mg Intramuscular Once Adonis Brook, NP   100 mg at 12/16/15 1628  . thiamine (VITAMIN B-1) tablet 100 mg  100 mg Oral Daily Adonis Brook, NP   100 mg at 12/18/15 9604    Lab Results:  Results for orders placed or performed during the hospital encounter of 12/16/15 (from the past 48 hour(s))  Lipid panel     Status: Abnormal   Collection Time: 12/17/15  6:18 AM  Result Value Ref Range   Cholesterol 245 (H) 0 - 200 mg/dL   Triglycerides 540 (H) <150 mg/dL   HDL 65 >98 mg/dL   Total CHOL/HDL Ratio 3.8 RATIO   VLDL 32 0 - 40 mg/dL   LDL Cholesterol 119 (H) 0 - 99 mg/dL    Comment:        Total Cholesterol/HDL:CHD Risk Coronary Heart Disease Risk Table                     Men   Women  1/2 Average Risk   3.4   3.3  Average Risk       5.0   4.4  2 X Average Risk   9.6   7.1  3 X Average Risk  23.4   11.0        Use the calculated Patient Ratio above and the CHD Risk Table to determine the patient's CHD Risk.        ATP III CLASSIFICATION (LDL):  <100     mg/dL   Optimal  147-829  mg/dL   Near or Above                    Optimal  130-159  mg/dL   Borderline  562-130  mg/dL   High  >865     mg/dL   Very High Performed at [facility]     Physical Findings: AIMS: Facial and Oral Movements Muscles of Facial Expression: None, normal Lips and Perioral Area: None, normal Jaw: None, normal Tongue: None, normal,Extremity Movements Upper (arms, wrists, hands, fingers): None, normal Lower (legs, knees, ankles, toes): None, normal, Trunk Movements Neck,  shoulders, hips: None, normal, Overall Severity Severity of abnormal movements (highest score from questions above): None, normal Incapacitation due to abnormal movements: None, normal  Patient's awareness of abnormal movements (rate only patient's report): No Awareness, Dental Status Current problems with teeth and/or dentures?: No Does patient usually wear dentures?: No  CIWA:  CIWA-Ar Total: 0 COWS:  COWS Total Score: 1  Musculoskeletal: Strength & Muscle Tone: within normal limits Gait & Station: normal Patient leans: normal  Psychiatric Specialty Exam: Review of Systems  Constitutional: Negative.   HENT: Negative.   Eyes: Negative.   Respiratory: Negative.   Cardiovascular: Negative.   Gastrointestinal: Negative.   Genitourinary: Negative.   Musculoskeletal: Negative.   Skin: Negative.   Neurological: Negative.   Endo/Heme/Allergies: Negative.   Psychiatric/Behavioral: Positive for depression and substance abuse. The patient is nervous/anxious.     Blood pressure 138/91, pulse 89, temperature 98.5 F (36.9 C), temperature source Oral, resp. rate 18, height  (1.753 m), weight 89.359 kg (197 lb), SpO2 100 %.Body mass index is 29.08 kg/(m^2).  General Appearance: Fairly Groomed  Patent attorney::  Fair  Speech:  Clear and Coherent  Volume:  fluctuates  Mood:  Depressed  Affect:  Appropriate and Congruent  Thought Process:  Coherent and Goal Directed  Orientation:  Full (Time, Place, and Person)  Thought Content:  symptoms events worries concerns  Suicidal Thoughts:  No  Homicidal Thoughts:  No  Memory:  Immediate;   Fair Recent;   Fair Remote;   Fair  Judgement:  Fair  Insight:  Present  Psychomotor Activity:  Restlessness  Concentration:  Fair  Recall:  [employer] of Knowledge:Fair  Language: Fair  Akathisia:  No  Handed:  Right  AIMS (if indicated):     Assets:  Desire for Improvement  ADL's:  Intact  Cognition: WNL  Sleep:  Number of Hours: 6.5    Treatment Plan Summary: Daily contact with patient to assess and evaluate symptoms and progress in treatment and Medication management   1. Will maintain Q 15 minutes observation for safety. Estimated LOS: 5-7 days 2. Patient will participate in group, milieu, and family therapy. Psychotherapy: Social and Doctor, hospital, anti-bullying, learning based strategies, cognitive behavioral, and family object relations individuation separation intervention psychotherapies can be considered.  3.  Medications: -Potassium 40 once hypokalemia -Zyprexa  qhs for mood stabilization -Depakote ER 500 mg mood sx -Ativan withdrawal protocol  Labs/tests: -Bal 375, UDS + for benzo/THC -Reviewed CBC, CMP K= 3.4; AST 45; WBC 11.9 (high), asymptomatic, may be acute stress response as neutrophils are WNL 4. Will continue to monitor patient's mood and behavior. 5. Social Work will schedule a Family meeting to obtain collateral information and discuss discharge and follow up plan. Discharge concerns will also be addressed: Safety, stabilization, and access to medication  Truman Hayward 12/18/2015, 11:36 AM  Physical Exam  I reviewed chart and agreed with the findings and treatment Plan.  Kathryne Sharper, MD

## 2015-12-18 NOTE — Progress Notes (Signed)
D:  Patient's self inventory sheet, patient sleeps good, sleep medication is helpful.  Good appetite, normal energy level, good concentration.  Denied depression and hopeless.  Anxiety #4.  Denied withdrawals.  Denied SI.  Physical problems pain, shoulder, cut above eye, worst pain in past 24 hours is #3.  Pain medication is helpful.  Goal is to control anxiety.  Plans to think positive and take medications. A:  Medications administered per MD orders.  Emotional support and encouragement given patient. R:  Denied SI and HI, contracts for safety.  Denied A/V hallucinations.  Safety maintained with 15 minute checks.  PATIENT IS VERY CONCERNED THAT HE MAY LOSE HIS JOB.  WILL TALK TO SW TOMORROW ABOUT LETTER TO EMPLOYER CONCERNING HIS HOSPITAL STAY.

## 2015-12-18 NOTE — Plan of Care (Signed)
Problem: Education: Goal: Ability to state activities that reduce stress will improve Outcome: Progressing Nurse discussed anxiety/coping skills with patient.    

## 2015-12-19 LAB — HEMOGLOBIN A1C
Hgb A1c MFr Bld: 4.8 % (ref 4.8–5.6)
Mean Plasma Glucose: 91 mg/dL

## 2015-12-19 MED ORDER — OLANZAPINE 5 MG PO TABS: 5.0000 mg | ORAL_TABLET | Freq: Every day | ORAL | Status: DC

## 2015-12-19 MED ORDER — DIVALPROEX SODIUM ER 500 MG PO TB24: 500.0000 mg | ORAL_TABLET | Freq: Every day | ORAL | Status: DC

## 2015-12-19 MED ORDER — HYDROXYZINE HCL 25 MG PO TABS: 25.0000 mg | ORAL_TABLET | Freq: Four times a day (QID) | ORAL | Status: DC | PRN

## 2015-12-19 NOTE — Progress Notes (Signed)
Writer observed patient earlier at beginning of shift lying in bed rest. He did not attend evening group but later got up and was in the dayroom interacting with peers. He reports that he has been up mostly all day and was a little tired. He reports that he is hoping to discharge on tomorrow so that he can get back to work. Support given and safety maintained on unit with 15 min checks.

## 2015-12-19 NOTE — Progress Notes (Signed)
Patient ID: Adam Novak, Adam Novak   DOB: 1991-06-01, 25 y.o.   MRN: [id number]  Pt discharged to lobby. Pt was stable and appreciative at that time. All papers and prescriptions were given and valuables returned. Verbal understanding expressed. Denies SI/HI and A/VH. Pt given opportunity to express concerns and ask questions.

## 2015-12-19 NOTE — Progress Notes (Signed)
  [facility] :  Will you be returning to the same living situation after discharge:  Yes,  Pt returning to parents house At discharge, do you have transportation home?: Yes,  Pt sister to pick up Do you have the ability to pay for your medications: Yes,  Pt provided with prescriptions and samples  Release of information consent forms completed and in the chart;  Patient's signature needed at discharge.  Patient to Follow up at: Follow-up Information    [facility]   Specialty:  Behavioral Health   Why:  Please use the walk-in clinic for your hospital discharge appointment. The clinic is open from 8am-3pm Monday-Friday.    Contact information:   [address] [place] [address] [phone]       Next level of care provider has access to [facility] Link:no  Safety Planning and Suicide Prevention discussed: Yes,  with sister; see SPE note  Have you used any form of tobacco in the last 30 days? (Cigarettes, Smokeless Tobacco, Cigars, and/or Pipes): Yes  Has patient been referred to the Quitline?: Patient refused referral  Patient has been referred for addiction treatment: Yes  Elaina HoopsCarter, Shoua Ulloa M 12/19/2015, 11:47 AM
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
## 2015-12-19 NOTE — BHH Suicide Risk Assessment (Signed)
B[facility] INPATIENT:  Family/Significant Other Suicide Prevention Education  Suicide Prevention Education:  Education Completed; Adam RushingSerita Novak [phone], has been identified by the patient as the family member/significant other with whom the patient will be residing, and identified as the person(s) who will aid the patient in the event of a mental health crisis (suicidal ideations/suicide attempt).  With written consent from the patient, the family member/significant other has been provided the following suicide prevention education, prior to the and/or following the discharge of the patient.  The suicide prevention education provided includes the following:  Suicide risk factors  Suicide prevention and interventions  National Suicide Hotline telephone number  [facility] assessment telephone number  [facility] [place] Emergency Assistance 911  [facility] and/or Residential Mobile Crisis Unit telephone number  Request made of family/significant other to:  Remove weapons (e.g., guns, rifles, knives), all items previously/currently identified as safety concern.    Remove drugs/medications (over-the-counter, prescriptions, illicit drugs), all items previously/currently identified as a safety concern.  The family member/significant other verbalizes understanding of the suicide prevention education information provided.  The family member/significant other agrees to remove the items of safety concern listed above.  Adam Novak, Adam Novak 12/19/2015, 11:37 AM

## 2015-12-19 NOTE — Progress Notes (Signed)
Psychoeducational Group Note  Date:  12/19/2015 Time:  1015  Group Topic/Focus:  Self Care:   The focus of this group is to help patients understand the importance of self-care in order to improve or restore emotional, physical, spiritual, interpersonal, and financial health.  Participation Level: Did Not Attend  Participation Quality:  Not Applicable  Affect:  Not Applicable  Cognitive:  Not Applicable  Insight:  Not Applicable  Engagement in Group: Not Applicable  Additional Comments:    Gwenevere Ghazili, Fotini Lemus Patience 12/19/2015, 5:10 PM

## 2015-12-19 NOTE — Discharge Summary (Signed)
Physician Discharge Summary Note  Patient:  Adam Novak is an 25 y.o., male MRN:  [id number] DOB:  1990-08-06 Patient phone:  [phone] (home)  Patient address:   [address] [place] [address],  Total Time spent with patient: 30 minutes  Date of Admission:  12/16/2015 Date of Discharge: 12/19/2015  Reason for Admission:  ETOH abuse  Principal Problem: Alcohol dependence with alcohol-induced mood disorder [facility]) Discharge Diagnoses: Patient Active Problem List   Diagnosis Date Noted  . Bipolar 1 disorder (HCC) [F31.9] 12/16/2015  . Alcohol dependence with alcohol-induced mood disorder (HCC) [F10.24]   . Alcohol use disorder, severe, dependence (HCC) [F10.20] 12/15/2015  . Bipolar I disorder, most recent episode depressed (HCC) [F31.30] 05/15/2015  . Dog bite of left calf [S81.852A, W54.0XXA] 06/19/2014  . Alcohol abuse [F10.10] 06/18/2014  . Alcohol withdrawal (HCC) [F10.239] 06/18/2014  . Alcohol intoxication, episodic (HCC) [F10.129] 06/18/2014  . Encephalopathy [G93.40] 06/18/2014  . Leukocytosis [D72.829]   . Polysubstance abuse [F19.10]   . CLOSED FRACTURE OF NECK OF METACARPAL BONE [S62.339A] 06/16/2010    Past Psychiatric History: see HPI  Past Medical History:  Past Medical History  Diagnosis Date  . Bipolar 1 disorder [facility])     Past Surgical History  Procedure Laterality Date  . Knee surgery Right    Family History: History reviewed. No pertinent family history. Family Psychiatric  History: see HPI Social History:  History  Alcohol Use  . Yes    Comment: socially     History  Drug Use  . Yes  . Special: Marijuana, Benzodiazepines    Social History   Social History  . Marital Status: Single    Spouse Name: N/A  . Number of Children: N/A  . Years of Education: N/A   Social History Main Topics  . Smoking status: Current Some Day Smoker -- 0.50 packs/day    Types: Cigarettes  . Smokeless tobacco: None  . Alcohol Use: Yes   Comment: socially  . Drug Use: Yes    Special: Marijuana, Benzodiazepines  . Sexual Activity: No   Other Topics Concern  . None   Social History Narrative    Hospital Course:   Adam Novak was admitted for Alcohol dependence with alcohol-induced mood disorder (HCC) and crisis management.  He was treated with medications and their indications listed below.  Medical problems were identified and treated as needed.  Home medications were restarted as appropriate.  Improvement was monitored by observation and Adam Novak daily report of symptom reduction.  Emotional and mental status was monitored by daily self inventory reports completed by Adam Novak and clinical staff.  Patient reported continued improvement, denied any new concerns.  Patient had been compliant on medications and denied side effects.  Support and encouragement was provided.    At time of discharge, patient rated both depression and anxiety levels to be manageable and minimal.  Patient encouraged to attend groups to help with recognizing triggers of emotional crises and de-stabilizations.  Patient encouraged to attend group to help identify the positive things in life that would help in dealing with feelings of loss, depression and unhealthy or abusive tendencies.         Adam Novak was evaluated by the treatment team for stability and plans for continued recovery upon discharge.  He was offered further treatment options upon discharge including Residential, Intensive Outpatient and Outpatient treatment. He will follow up with agency listed below for medication management and counseling.  Encouraged  patient to maintain satisfactory support network and home environment.  Advised to adhere to medication compliance and outpatient treatment follow up.  Prescriptions provided.       Adam Novak motivation was an integral factor for scheduling further treatment.  Employment, transportation, bed  availability, health status, family support, and any pending legal issues were also considered during his hospital stay.  Upon completion of this admission the patient was both mentally and medically stable for discharge denying suicidal/homicidal ideation, auditory/visual/tactile hallucinations, delusional thoughts and paranoia.      Physical Findings: AIMS: Facial and Oral Movements Muscles of Facial Expression: None, normal Lips and Perioral Area: None, normal Jaw: None, normal Tongue: None, normal,Extremity Movements Upper (arms, wrists, hands, fingers): None, normal Lower (legs, knees, ankles, toes): None, normal, Trunk Movements Neck, shoulders, hips: None, normal, Overall Severity Severity of abnormal movements (highest score from questions above): None, normal Incapacitation due to abnormal movements: None, normal Patient's awareness of abnormal movements (rate only patient's report): No Awareness, Dental Status Current problems with teeth and/or dentures?: No Does patient usually wear dentures?: No  CIWA:  CIWA-Ar Total: 0 COWS:  COWS Total Score: 2  Musculoskeletal: Strength & Muscle Tone: within normal limits Gait & Station: normal Patient leans: N/A  Psychiatric Specialty Exam:  SEE MD SRA Physical Exam  Vitals reviewed. Psychiatric: He has a normal mood and affect. His behavior is normal. Thought content normal.    Review of Systems  Psychiatric/Behavioral: Negative for depression, suicidal ideas and hallucinations. The patient does not have insomnia.   All other systems reviewed and are negative.   Blood pressure 156/99, pulse 83, temperature 97.8 F (36.6 C), temperature source Oral, resp. rate 18, height  (1.753 m), weight 89.359 kg (197 lb), SpO2 100 %.Body mass index is 29.08 kg/(m^2).   Have you used any form of tobacco in the last 30 days? (Cigarettes, Smokeless Tobacco, Cigars, and/or Pipes): Yes  Has this patient used any form of tobacco in the last 30  days? (Cigarettes, Smokeless Tobacco, Cigars, and/or Pipes) Yes, N/A  Blood Alcohol level:  Lab Results  Component Value Date   ETH 375* 12/15/2015   ETH 230* 05/14/2015    Metabolic Disorder Labs:  Lab Results  Component Value Date   HGBA1C 4.8 12/17/2015   MPG 91 12/17/2015   No results found for: PROLACTIN Lab Results  Component Value Date   CHOL 245* 12/17/2015   TRIG 161* 12/17/2015   HDL 65 12/17/2015   CHOLHDL 3.8 12/17/2015   VLDL 32 12/17/2015   LDLCALC 148* 12/17/2015    See Psychiatric Specialty Exam and Suicide Risk Assessment completed by Attending Physician prior to discharge.  Discharge destination:  Home  Is patient on multiple antipsychotic therapies at discharge:  No   Has Patient had three or more failed trials of antipsychotic monotherapy by history:  No  Recommended Plan for Multiple Antipsychotic Therapies: NA    Medication List    STOP taking these medications        acamprosate 333 MG tablet  Commonly known as:  CAMPRAL     benztropine 0.5 MG tablet  Commonly known as:  COGENTIN      TAKE these medications      Indication   divalproex 500 MG 24 hr tablet  Commonly known as:  DEPAKOTE ER  Take 1 tablet (500 mg total) by mouth at bedtime.   Indication:  mood stabilization     hydrOXYzine 25 MG tablet  Commonly known as:  ATARAX/VISTARIL  Take 1 tablet (25 mg total) by mouth every 6 (six) hours as needed.   Indication:  Anxiety Neurosis     OLANZapine 5 MG tablet  Commonly known as:  ZYPREXA  Take 1 tablet (5 mg total) by mouth at bedtime.   Indication:  mood symptoms           Follow-up Information    [facility]   Specialty:  Behavioral Health   Why:  Please use the walk-in clinic for your hospital discharge appointment. The clinic is open from 8am-3pm Monday-Friday.    Contact informationElpidio Eric:   [address] [place] [address] [phone]      Follow-up recommendations:  Activity:  as tol Diet:  as  tol  Comments:  1.  Take all your medications as prescribed.   2.  Report any adverse side effects to outpatient provider. 3.  Patient instructed to not use alcohol or illegal drugs while on prescription medicines. 4.  In the event of worsening symptoms, instructed patient to call 911, the crisis hotline or go to nearest emergency room for evaluation of symptoms.  Signed: Lindwood QuaSheila May Agustin, NP [facility] 12/19/2015, 2:22 PM  Patient seen, Suicide Assessment Completed.  Disposition Plan Reviewed

## 2015-12-19 NOTE — Progress Notes (Signed)
Recreation Therapy Notes  Date: 07.10.2017 Time: 9:30am  Location: [address] Group Room   Group Topic: Stress Management  Goal Area(s) Addresses:  Patient will actively participate in stress management techniques presented during session.   Behavioral Response: Did not attend.   Adam Novak L Katelind Pytel, LRT/CTRS        Sayuri Rhames L 12/19/2015 1:46 PM 

## 2015-12-19 NOTE — Tx Team (Signed)
Interdisciplinary Treatment Plan Update (Adult) Date: 12/19/2015   Date: 12/19/2015 11:45 AM  Progress in Treatment:  Attending groups: No   Participating in groups: No  Taking medication as prescribed: Yes  Tolerating medication: Yes  Family/Significant othe contact made: Yes with sister Patient understands diagnosis: Limited insight Discussing patient identified problems/goals with staff: Yes  Medical problems stabilized or resolved: Yes  Denies suicidal/homicidal ideation: Yes Patient has not harmed self or Others: Yes   New problem(s) identified: None identified at this time.   Discharge Plan or Barriers: Pt will return home and follow-up with outpatient services.   Additional comments:  Patient and CSW reviewed pt's identified goals and treatment plan. Patient verbalized understanding and agreed to treatment plan.   Reason for Continuation of Hospitalization:  Depression Medication stabilization Suicidal ideation  Estimated length of stay: 0days  Review of initial/current patient goals per problem list:   1.  Goal(s): Patient will participate in aftercare plan  Met:  No  Target date: 3-5 days from date of admission   As evidenced by: Patient will participate within aftercare plan AEB aftercare provider and housing plan at discharge being identified.  12/16/15: Pt will return home and follow-up with outpatient services.  12/19/2015: Pt will return home and follow-up with outpatient services.   2.  Goal (s): Patient will exhibit decreased depressive symptoms and suicidal ideations.  Met:  Adequate for DC  Target date: 3-5 days from date of admission   As evidenced by: Patient will utilize self rating of depression at 3 or below and demonstrate decreased signs of depression or be deemed stable for discharge by MD. 12/16/15: Pt was admitted with symptoms of depression, rating 10/10. Pt continues to present with flat affect and depressive symptoms.  Pt will demonstrate  decreased symptoms of depression and rate depression at 3/10 or lower prior to discharge. 12/19/2015: MD feels that [name] symptoms have decreased to the point that they can be managed in an outpatient setting.   Attendees:  Patient:    Family:    Physician: Dr. Parke Poisson, MD  12/19/2015 11:45 AM  Nursing: Marcella Dubs RN 12/19/2015 11:45 AM  Clinical Social Worker Peri Maris, [place] 12/19/2015 11:45 AM  Other: Erasmo Downer Drinkard, [place] 12/19/2015 11:45 AM  Clinical:  12/19/2015 11:45 AM  Other:  12/19/2015 11:45 AM  Other:     Peri Maris, [place] Clinical Social Work [phone]

## 2015-12-19 NOTE — Progress Notes (Signed)
Patient ID: Adam Novak, male   DOB: 1991-01-15, 25 y.o.   MRN: [id number]  Pt currently presents with a flat affect and cooperative behavior. Per self inventory, pt rates depression at a 0, hopelessness 0 and anxiety 4. [name] daily goal is to "controlling my anxiety" and they intend to do so by "taking my prescribed medications." Pt reports good sleep, a good appetite, high energy and good concentration.   Pt provided with medications per providers orders. [name] labs and vitals were monitored throughout the day. Pt supported emotionally and encouraged to express concerns and questions. Pt educated on medications and suicide prevention resources.   [name] safety ensured with 15 minute and environmental checks. Pt currently denies SI/HI and A/V hallucinations. Pt verbally agrees to seek staff if SI/HI or A/VH occurs and to consult with staff before acting on any harmful thoughts. Pt reports that his plan is to live with his sister for a while post discharge. Will continue POC.

## 2016-03-25 ENCOUNTER — Emergency Department (HOSPITAL_COMMUNITY)
Admission: EM | Admit: 2016-03-25 | Discharge: 2016-03-25 | Disposition: A | Discharge status: Home / Self Care | Payer: Self-pay | Attending: Emergency Medicine | Admitting: Emergency Medicine

## 2016-03-25 ENCOUNTER — Encounter (HOSPITAL_COMMUNITY): Payer: Self-pay | Admitting: *Deleted

## 2016-03-25 ENCOUNTER — Emergency Department (HOSPITAL_COMMUNITY): Payer: Self-pay

## 2016-03-25 DIAGNOSIS — Y939 Activity, unspecified: Secondary | ICD-10-CM | POA: Insufficient documentation

## 2016-03-25 DIAGNOSIS — Y999 Unspecified external cause status: Secondary | ICD-10-CM | POA: Insufficient documentation

## 2016-03-25 DIAGNOSIS — S20211A Contusion of right front wall of thorax, initial encounter: Secondary | ICD-10-CM | POA: Insufficient documentation

## 2016-03-25 DIAGNOSIS — F17210 Nicotine dependence, cigarettes, uncomplicated: Secondary | ICD-10-CM | POA: Insufficient documentation

## 2016-03-25 DIAGNOSIS — Y92410 Unspecified street and highway as the place of occurrence of the external cause: Secondary | ICD-10-CM | POA: Insufficient documentation

## 2016-03-25 MED ORDER — NAPROXEN 500 MG PO TABS: 500.0000 mg | ORAL_TABLET | Freq: Two times a day (BID) | ORAL | 0 refills | Status: DC
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
## 2016-08-07 ENCOUNTER — Encounter (HOSPITAL_COMMUNITY): Payer: Self-pay | Admitting: *Deleted

## 2016-08-07 ENCOUNTER — Inpatient Hospital Stay (HOSPITAL_COMMUNITY)
Admission: AD | Admit: 2016-08-07 | Discharge: 2016-08-11 | DRG: 885 | Disposition: A | Discharge status: Home / Self Care | Payer: Federal, State, Local not specified - Other | Source: Intra-hospital | Attending: Psychiatry | Admitting: Psychiatry

## 2016-08-07 ENCOUNTER — Encounter (HOSPITAL_COMMUNITY): Payer: Self-pay

## 2016-08-07 ENCOUNTER — Emergency Department (HOSPITAL_COMMUNITY)
Admission: EM | Admit: 2016-08-07 | Discharge: 2016-08-07 | Disposition: A | Discharge status: Other Acute Inpatient Hospital | Payer: Self-pay | Attending: Emergency Medicine | Admitting: Emergency Medicine

## 2016-08-07 DIAGNOSIS — Y906 Blood alcohol level of 120-199 mg/100 ml: Secondary | ICD-10-CM | POA: Diagnosis present

## 2016-08-07 DIAGNOSIS — F10229 Alcohol dependence with intoxication, unspecified: Secondary | ICD-10-CM | POA: Diagnosis present

## 2016-08-07 DIAGNOSIS — F1910 Other psychoactive substance abuse, uncomplicated: Secondary | ICD-10-CM | POA: Diagnosis present

## 2016-08-07 DIAGNOSIS — F314 Bipolar disorder, current episode depressed, severe, without psychotic features: Secondary | ICD-10-CM | POA: Diagnosis not present

## 2016-08-07 DIAGNOSIS — Z560 Unemployment, unspecified: Secondary | ICD-10-CM | POA: Diagnosis not present

## 2016-08-07 DIAGNOSIS — F17210 Nicotine dependence, cigarettes, uncomplicated: Secondary | ICD-10-CM | POA: Diagnosis present

## 2016-08-07 DIAGNOSIS — Z9889 Other specified postprocedural states: Secondary | ICD-10-CM | POA: Diagnosis not present

## 2016-08-07 DIAGNOSIS — F319 Bipolar disorder, unspecified: Principal | ICD-10-CM | POA: Diagnosis present

## 2016-08-07 DIAGNOSIS — Z79899 Other long term (current) drug therapy: Secondary | ICD-10-CM | POA: Diagnosis not present

## 2016-08-07 DIAGNOSIS — F3130 Bipolar disorder, current episode depressed, mild or moderate severity, unspecified: Secondary | ICD-10-CM | POA: Diagnosis present

## 2016-08-07 LAB — CBC WITH DIFFERENTIAL/PLATELET
Basophils Absolute: 0.1 10*3/uL (ref 0.0–0.1)
Basophils Relative: 1 %
EOS ABS: 0.5 10*3/uL (ref 0.0–0.7)
Eosinophils Relative: 6 %
HCT: 44.9 % (ref 39.0–52.0)
HEMOGLOBIN: 15.8 g/dL (ref 13.0–17.0)
LYMPHS ABS: 3.2 10*3/uL (ref 0.7–4.0)
LYMPHS PCT: 39 %
MCH: 33.7 pg (ref 26.0–34.0)
MCHC: 35.2 g/dL (ref 30.0–36.0)
MCV: 95.7 fL (ref 78.0–100.0)
Monocytes Absolute: 0.8 10*3/uL (ref 0.1–1.0)
Monocytes Relative: 9 %
NEUTROS PCT: 45 %
Neutro Abs: 3.8 10*3/uL (ref 1.7–7.7)
Platelets: 266 10*3/uL (ref 150–400)
RBC: 4.69 MIL/uL (ref 4.22–5.81)
RDW: 13 % (ref 11.5–15.5)
WBC: 8.3 10*3/uL (ref 4.0–10.5)

## 2016-08-07 LAB — COMPREHENSIVE METABOLIC PANEL
ALT: 27 U/L (ref 17–63)
ANION GAP: 9 (ref 5–15)
AST: 29 U/L (ref 15–41)
Albumin: 4.6 g/dL (ref 3.5–5.0)
Alkaline Phosphatase: 80 U/L (ref 38–126)
BUN: 7 mg/dL (ref 6–20)
CALCIUM: 8.9 mg/dL (ref 8.9–10.3)
CHLORIDE: 106 mmol/L (ref 101–111)
CO2: 26 mmol/L (ref 22–32)
Creatinine, Ser: 0.88 mg/dL (ref 0.61–1.24)
GFR calc non Af Amer: >60 mL/min (ref >60)
Glucose, Bld: 96 mg/dL (ref 65–99)
Potassium: 4 mmol/L (ref 3.5–5.1)
SODIUM: 141 mmol/L (ref 135–145)
Total Bilirubin: 0.4 mg/dL (ref 0.3–1.2)
Total Protein: 7.8 g/dL (ref 6.5–8.1)

## 2016-08-07 LAB — RAPID URINE DRUG SCREEN, HOSP PERFORMED
AMPHETAMINES: NOT DETECTED
Barbiturates: NOT DETECTED
Benzodiazepines: POSITIVE — AB
COCAINE: POSITIVE — AB
OPIATES: NOT DETECTED
TETRAHYDROCANNABINOL: POSITIVE — AB

## 2016-08-07 LAB — SALICYLATE LEVEL

## 2016-08-07 LAB — ACETAMINOPHEN LEVEL: Acetaminophen (Tylenol), Serum: <10 ug/mL — ABNORMAL LOW (ref 10–30)

## 2016-08-07 LAB — ETHANOL: Alcohol, Ethyl (B): 154 mg/dL — ABNORMAL HIGH (ref <5)

## 2016-08-07 MED ORDER — HALOPERIDOL LACTATE 5 MG/ML IJ SOLN
5.0000 mg | INTRAMUSCULAR | Status: DC | PRN
  Administered 2016-08-07: 5 mg via INTRAMUSCULAR
  Filled 2016-08-07 (×2): qty 1

## 2016-08-07 MED ORDER — CHLORDIAZEPOXIDE HCL 25 MG PO CAPS: 25.0000 mg | ORAL_CAPSULE | Freq: Four times a day (QID) | ORAL | Status: DC | PRN

## 2016-08-07 MED ORDER — LOPERAMIDE HCL 2 MG PO CAPS: 2.0000 mg | ORAL_CAPSULE | ORAL | Status: AC | PRN

## 2016-08-07 MED ORDER — VITAMIN B-1 100 MG PO TABS
100.0000 mg | ORAL_TABLET | Freq: Every day | ORAL | Status: DC
  Administered 2016-08-08 – 2016-08-11 (×4): 100 mg via ORAL
  Filled 2016-08-07 (×8): qty 1

## 2016-08-07 MED ORDER — CHLORDIAZEPOXIDE HCL 25 MG PO CAPS
25.0000 mg | ORAL_CAPSULE | Freq: Four times a day (QID) | ORAL | Status: AC
  Administered 2016-08-07 – 2016-08-08 (×6): 25 mg via ORAL
  Filled 2016-08-07 (×6): qty 1

## 2016-08-07 MED ORDER — ALUM & MAG HYDROXIDE-SIMETH 200-200-20 MG/5ML PO SUSP: 30.0000 mL | ORAL | Status: DC | PRN

## 2016-08-07 MED ORDER — CHLORDIAZEPOXIDE HCL 25 MG PO CAPS
25.0000 mg | ORAL_CAPSULE | Freq: Four times a day (QID) | ORAL | Status: AC | PRN
  Administered 2016-08-10: 25 mg via ORAL
  Filled 2016-08-07: qty 1

## 2016-08-07 MED ORDER — MAGNESIUM HYDROXIDE 400 MG/5ML PO SUSP: 30.0000 mL | Freq: Every day | ORAL | Status: DC | PRN

## 2016-08-07 MED ORDER — ONDANSETRON 4 MG PO TBDP: 4.0000 mg | ORAL_TABLET | Freq: Four times a day (QID) | ORAL | Status: AC | PRN

## 2016-08-07 MED ORDER — CHLORDIAZEPOXIDE HCL 25 MG PO CAPS
25.0000 mg | ORAL_CAPSULE | ORAL | Status: AC
  Administered 2016-08-10 (×2): 25 mg via ORAL
  Filled 2016-08-07 (×2): qty 1

## 2016-08-07 MED ORDER — VITAMIN B-1 100 MG PO TABS: 100.0000 mg | ORAL_TABLET | Freq: Every day | ORAL | Status: DC

## 2016-08-07 MED ORDER — DIPHENHYDRAMINE HCL 50 MG PO CAPS
50.0000 mg | ORAL_CAPSULE | Freq: Once | ORAL | Status: AC
  Administered 2016-08-07: 50 mg via ORAL
  Filled 2016-08-07: qty 1
  Filled 2016-08-07: qty 2

## 2016-08-07 MED ORDER — CHLORDIAZEPOXIDE HCL 25 MG PO CAPS
25.0000 mg | ORAL_CAPSULE | Freq: Three times a day (TID) | ORAL | Status: AC
  Administered 2016-08-09 (×3): 25 mg via ORAL
  Filled 2016-08-07 (×3): qty 1

## 2016-08-07 MED ORDER — CHLORDIAZEPOXIDE HCL 25 MG PO CAPS
25.0000 mg | ORAL_CAPSULE | Freq: Every day | ORAL | Status: AC
  Administered 2016-08-11: 25 mg via ORAL
  Filled 2016-08-07: qty 1

## 2016-08-07 MED ORDER — HYDROXYZINE HCL 25 MG PO TABS: 25.0000 mg | ORAL_TABLET | Freq: Four times a day (QID) | ORAL | Status: DC | PRN

## 2016-08-07 MED ORDER — ACETAMINOPHEN 325 MG PO TABS: 650.0000 mg | ORAL_TABLET | Freq: Four times a day (QID) | ORAL | Status: DC | PRN

## 2016-08-07 MED ORDER — LORAZEPAM 1 MG PO TABS
1.0000 mg | ORAL_TABLET | ORAL | Status: AC | PRN
  Administered 2016-08-07: 1 mg via ORAL
  Filled 2016-08-07: qty 1

## 2016-08-07 MED ORDER — OLANZAPINE 5 MG PO TABS
5.0000 mg | ORAL_TABLET | Freq: Every day | ORAL | Status: DC
  Administered 2016-08-07: 5 mg via ORAL
  Filled 2016-08-07: qty 2
  Filled 2016-08-07 (×3): qty 1

## 2016-08-07 MED ORDER — ZIPRASIDONE MESYLATE 20 MG IM SOLR: 20.0000 mg | INTRAMUSCULAR | Status: DC | PRN

## 2016-08-07 MED ORDER — ADULT MULTIVITAMIN W/MINERALS CH: 1.0000 | ORAL_TABLET | Freq: Every day | ORAL | Status: DC

## 2016-08-07 MED ORDER — THIAMINE HCL 100 MG/ML IJ SOLN
100.0000 mg | Freq: Once | INTRAMUSCULAR | Status: DC
  Filled 2016-08-07: qty 2

## 2016-08-07 MED ORDER — HYDROXYZINE HCL 25 MG PO TABS
25.0000 mg | ORAL_TABLET | Freq: Four times a day (QID) | ORAL | Status: AC | PRN
  Administered 2016-08-09 – 2016-08-10 (×3): 25 mg via ORAL
  Filled 2016-08-07 (×3): qty 1

## 2016-08-07 MED ORDER — LOPERAMIDE HCL 2 MG PO CAPS: 2.0000 mg | ORAL_CAPSULE | ORAL | Status: DC | PRN

## 2016-08-07 MED ORDER — THIAMINE HCL 100 MG/ML IJ SOLN: 100.0000 mg | Freq: Once | INTRAMUSCULAR | Status: DC

## 2016-08-07 MED ORDER — RISPERIDONE 2 MG PO TBDP
2.0000 mg | ORAL_TABLET | Freq: Three times a day (TID) | ORAL | Status: DC | PRN
  Filled 2016-08-07: qty 1

## 2016-08-07 MED ORDER — ONDANSETRON 4 MG PO TBDP: 4.0000 mg | ORAL_TABLET | Freq: Four times a day (QID) | ORAL | Status: DC | PRN

## 2016-08-07 MED ORDER — ADULT MULTIVITAMIN W/MINERALS CH
1.0000 | ORAL_TABLET | Freq: Every day | ORAL | Status: DC
  Administered 2016-08-07 – 2016-08-11 (×5): 1 via ORAL
  Filled 2016-08-07 (×10): qty 1

## 2016-08-07 NOTE — BH Assessment (Signed)
[facility] Assessment Progress Note  Per Thedore MinsMojeed Akintayo, MD, this pt requires psychiatric hospitalization at this time.  Berneice Heinrichina Tate, RN, [facility].  Pt has signed Voluntary Admission and Consent for Treatment, as well as Consent to Release Information to no one, and signed forms have [facility].  [name] nurse, Kendal Hymendie, has been notified, and agrees to send original paperwork along with pt via [facility], and to call report to [phone].  Doylene Canninghomas Aracely Rickett, MA Triage Specialist [phone]

## 2016-08-07 NOTE — Progress Notes (Signed)
Patient father Fayrene FearingJames request to be updated:  Cell [phone] House [phone]  Stacy GardnerErin Angelmarie Ponzo, [facility] Clinical Social Worker [phone]

## 2016-08-07 NOTE — ED Notes (Signed)
Patient ambulated to restroom to change into paper scrubs.
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
## 2016-08-07 NOTE — Tx Team (Signed)
Initial Treatment Plan 08/07/2016 4:05 PM Adam Novak Karpowicz [id number]    PATIENT STRESSORS: Financial difficulties Legal issue Marital or family conflict Medication change or noncompliance Occupational concerns Substance abuse   PATIENT STRENGTHS: Average or above average intelligence Communication skills Physical Health Supportive family/friends   PATIENT IDENTIFIED PROBLEMS: Depression  Anxiety  Suicidal Ideation  Conflict with family  Jobless  Alcohol abuse  Polysubstance abuse         DISCHARGE CRITERIA:  Improved stabilization in mood, thinking, and/or behavior Medical problems require only outpatient monitoring Need for constant or close observation no longer present Reduction of life-threatening or endangering symptoms to within safe limits Withdrawal symptoms are absent or subacute and managed without 24-hour nursing intervention  PRELIMINARY DISCHARGE PLAN: Attend 12-step recovery group Outpatient therapy Return to previous living arrangement  PATIENT/FAMILY INVOLVEMENT: This treatment plan has been presented to and reviewed with the patient, Adam Novak [place].  The patient and family have been given the opportunity to ask questions and make suggestions.  Cranford MonBeaudry, Aliani Caccavale Evans, RN 08/07/2016, 4:05 PM

## 2016-08-07 NOTE — ED Notes (Signed)
Patient changed into paper scrubs.  Patient made aware that we need blood specimens, patient refusing to allow staff to collect at this time, but did provide urine specimen.    Notified security about patient and belongings needing to be wanded.

## 2016-08-07 NOTE — ED Triage Notes (Signed)
Pt here voluntarily with [employer], he states that he doesn't want to live anymore and has run out of his medications [name] parents called [employer] because they were in an argument but he was calm when they arrived

## 2016-08-07 NOTE — ED Notes (Signed)
Bed: WTR5 Expected date:  Expected time:  Means of arrival:  Comments: 
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
## 2016-08-07 NOTE — ED Notes (Signed)
Pt was pleasant and cooperative when he woke.  Pt contracts for safety.  Denies H/I and AVH.

## 2016-08-07 NOTE — ED Provider Notes (Signed)
[facility]-EMERGENCY DEPT Provider Note   CSN: [id number] Arrival date & time: 08/07/16  [id number]     History   Chief Complaint Chief Complaint  Patient presents with  . Suicidal    HPI Adam Novak is a 26 y.o. male.  The history is provided by the patient.  Mental Health Problem  Presenting symptoms: suicidal thoughts and suicidal threats   Patient accompanied by:  Law enforcement Degree of incapacity (severity):  Moderate Onset quality:  Gradual Timing:  Intermittent Progression:  Waxing and waning Chronicity:  Recurrent Context: alcohol use, drug abuse and noncompliance   Treatment compliance:  Some of the time Relieved by:  Nothing Worsened by:  Family interactions Associated symptoms: poor judgment   Risk factors: hx of mental illness and recent psychiatric admission     Past Medical History:  Diagnosis Date  . Bipolar 1 disorder [facility](HCC)     Patient Active Problem List   Diagnosis Date Noted  . Bipolar affective disorder, depressed, severe (HCC) 08/07/2016  . Bipolar 1 disorder (HCC) 12/16/2015  . Alcohol dependence with alcohol-induced mood disorder (HCC)   . Alcohol use disorder, severe, dependence (HCC) 12/15/2015  . Bipolar I disorder, most recent episode depressed (HCC) 05/15/2015  . Dog bite of left calf 06/19/2014  . Alcohol abuse 06/18/2014  . Alcohol withdrawal (HCC) 06/18/2014  . Alcohol intoxication, episodic (HCC) 06/18/2014  . Encephalopathy 06/18/2014  . Leukocytosis   . Polysubstance abuse   . CLOSED FRACTURE OF NECK OF METACARPAL BONE 06/16/2010    Past Surgical History:  Procedure Laterality Date  . KNEE SURGERY Right        Home Medications    Prior to Admission medications   Medication Sig Start Date End Date Taking? Authorizing Provider  Multiple Vitamin (MULTIVITAMIN WITH MINERALS) TABS tablet Take 1 tablet by mouth daily.   Yes Historical Provider, MD  divalproex (DEPAKOTE ER) 500 MG 24 hr tablet Take 1 tablet (500 mg total)  by mouth at bedtime. Patient not taking: Reported on 08/07/2016 12/19/15   Adonis BrookSheila Agustin, NP  hydrOXYzine (ATARAX/VISTARIL) 25 MG tablet Take 1 tablet (25 mg total) by mouth every 6 (six) hours as needed. Patient not taking: Reported on 08/07/2016 12/19/15   Adonis BrookSheila Agustin, NP  OLANZapine (ZYPREXA) 5 MG tablet Take 1 tablet (5 mg total) by mouth at bedtime. Patient not taking: Reported on 08/07/2016 12/19/15   Adonis BrookSheila Agustin, NP    Family History History reviewed. No pertinent family history.  Social History Social History  Substance Use Topics  . Smoking status: Current Some Day Smoker    Packs/day: 0.50    Types: Cigarettes  . Smokeless tobacco: Never Used  . Alcohol use Yes     Comment: socially     Allergies   Patient has no known allergies.   Review of Systems Review of Systems  Psychiatric/Behavioral: Positive for suicidal ideas.  Ten systems are reviewed and are negative for acute change except as noted in the HPI    Physical Exam Updated Vital Signs BP 143/76   Pulse 95   Temp 98.6 F (37 C)   Resp 18   SpO2 99%   Physical Exam  Constitutional: He is oriented to person, place, and time. He appears well-developed and well-nourished. No distress.  Clinically intoxicated  HENT:  Head: Normocephalic and atraumatic.  Nose: Nose normal.  Eyes: Conjunctivae and EOM are normal. Pupils are equal, round, and reactive to light. Right eye exhibits no discharge. Left eye exhibits no discharge.  No scleral icterus.  Neck: Normal range of motion. Neck supple.  Cardiovascular: Normal rate and regular rhythm.  Exam reveals no gallop and no friction rub.   No murmur heard. Pulmonary/Chest: Effort normal and breath sounds normal. No stridor. No respiratory distress. He has no rales.  Abdominal: Soft. He exhibits no distension. There is no tenderness.  Musculoskeletal: He exhibits no edema or tenderness.  Neurological: He is alert and oriented to person, place, and time.  Skin:  Skin is warm and dry. No rash noted. He is not diaphoretic. No erythema.  Psychiatric: He has a normal mood and affect.  Vitals reviewed.    ED Treatments / Results  Labs (all labs ordered are listed, but only abnormal results are displayed) Labs Reviewed  [name] - Abnormal; Notable for the following:       Result Value   Alcohol, Ethyl (B) 154 (*)    All other components within normal limits  RAPID URINE DRUG SCREEN, HOSP PERFORMED - Abnormal; Notable for the following:    Cocaine POSITIVE (*)    Benzodiazepines POSITIVE (*)    Tetrahydrocannabinol POSITIVE (*)    All other components within normal limits  ACETAMINOPHEN LEVEL - Abnormal; Notable for the following:    Acetaminophen (Tylenol), Serum <10 (*)    All other components within normal limits  COMPREHENSIVE METABOLIC PANEL  CBC WITH DIFFERENTIAL/PLATELET  SALICYLATE LEVEL    EKG  EKG Interpretation None       Radiology No results found.  Procedures Procedures (including critical care time)  Medications Ordered in ED Medications - No data to display   Initial Impression / Assessment and Plan / ED Course  I have reviewed the triage vital signs and the nursing notes.  Pertinent labs & imaging results that were available during my care of the patient were reviewed by me and considered in my medical decision making (see chart for details).     Allowed to MTF. Medically cleared for [facility] eval and treatment. They recommended in patient management. Sent to [facility].   Final Clinical Impressions(s) / ED Diagnoses   Final diagnoses:  Bipolar I disorder, most recent episode depressed (HCC)      Nira Conn, MD 08/07/16 [phone]

## 2016-08-07 NOTE — ED Notes (Signed)
Pt was oriented to room and unit and promptly went to sleep.  15 minute checks and video monitoring in place.

## 2016-08-07 NOTE — ED Notes (Signed)
Pt discharged ambulatory with [place] driver.  All belongings were sent with patient. 

## 2016-08-07 NOTE — BHH Counselor (Signed)
Pt was groggy and kept falling asleep while Clinical research associatewriter was asking questions. He was not able to answer anything at this time. RN stated pt was medicated two hours ago. Will attempt to assess once pt is more alert.   [address]Adam Novak CeruleanLPC, [address]

## 2016-08-07 NOTE — ED Notes (Signed)
Patient denies pain and is resting comfortably.  

## 2016-08-07 NOTE — Progress Notes (Signed)
Psychoeducational Group Note  Date:  08/07/2016 Time:  2340  Group Topic/Focus:  Wrap-Up Group:   The focus of this group is to help patients review their daily goal of treatment and discuss progress on daily workbooks.  Participation Level: Did Not Attend  Participation Quality:  Not Applicable  Affect:  Not Applicable  Cognitive:  Not Applicable  Insight:  Not Applicable  Engagement in Group: Not Applicable  Additional Comments:  The patient did not attend group this evening since he was asleep in his bed.   [name] S 08/07/2016, 11:40 PM

## 2016-08-08 DIAGNOSIS — F314 Bipolar disorder, current episode depressed, severe, without psychotic features: Secondary | ICD-10-CM

## 2016-08-08 DIAGNOSIS — F1910 Other psychoactive substance abuse, uncomplicated: Secondary | ICD-10-CM

## 2016-08-08 DIAGNOSIS — Z79899 Other long term (current) drug therapy: Secondary | ICD-10-CM

## 2016-08-08 MED ORDER — TRAZODONE HCL 50 MG PO TABS
50.0000 mg | ORAL_TABLET | Freq: Every evening | ORAL | Status: DC | PRN
  Administered 2016-08-08 (×2): 50 mg via ORAL
  Filled 2016-08-08 (×6): qty 1

## 2016-08-08 MED ORDER — HALOPERIDOL LACTATE 5 MG/ML IJ SOLN: 5.0000 mg | Freq: Three times a day (TID) | INTRAMUSCULAR | Status: DC | PRN

## 2016-08-08 MED ORDER — NALTREXONE HCL 50 MG PO TABS
50.0000 mg | ORAL_TABLET | Freq: Every day | ORAL | Status: DC
  Administered 2016-08-09 – 2016-08-11 (×3): 50 mg via ORAL
  Filled 2016-08-08: qty 7
  Filled 2016-08-08 (×4): qty 1

## 2016-08-08 MED ORDER — HALOPERIDOL 5 MG PO TABS: 5.0000 mg | ORAL_TABLET | Freq: Three times a day (TID) | ORAL | Status: DC | PRN

## 2016-08-08 MED ORDER — DIVALPROEX SODIUM ER 500 MG PO TB24
1000.0000 mg | ORAL_TABLET | Freq: Every day | ORAL | Status: DC
  Administered 2016-08-08 – 2016-08-10 (×3): 1000 mg via ORAL
  Filled 2016-08-08 (×2): qty 2
  Filled 2016-08-08: qty 14
  Filled 2016-08-08 (×3): qty 2

## 2016-08-08 MED ORDER — HYDRALAZINE HCL 10 MG PO TABS
10.0000 mg | ORAL_TABLET | Freq: Once | ORAL | Status: AC
Start: 2016-08-08 — End: 2016-08-08
  Administered 2016-08-08: 10 mg via ORAL
  Filled 2016-08-08 (×2): qty 1

## 2016-08-08 NOTE — Progress Notes (Addendum)
Patient ID: Adam Novak, male   DOB: 1990-07-23, 26 y.o.   MRN: [id number]  Pt currently presents with a flat affect and agitated behavior. Pt seen pacing in the hallway tonight. Speech is aggressive, pressured. Pt reports poor sleep with current medication regimen. Requests to take other medication to help with sleep. Pt trending hypertensive.  Pt provided with medications per providers orders. [name] labs and vitals were monitored throughout the night. Pt given a 1:1 about emotional and mental status. Pt supported and encouraged to express concerns and questions. Pt educated on medications. Provider notified of patients bp, see MAR.   [name] safety ensured with 15 minute and environmental checks. Pt currently denies SI/HI and A/V hallucinations. Pt verbally agrees to seek staff if SI/HI or A/VH occurs and to consult with staff before acting on any harmful thoughts. Will continue POC.
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
## 2016-08-08 NOTE — Progress Notes (Signed)
Pt did not attend AA group this evening. Pt was asleep during most of group time. Pt was woken and asked to attend but declined.

## 2016-08-08 NOTE — [facility] Group Notes (Signed)
[facility] LCSW Group Therapy  08/08/2016 11:48 AM  Type of Therapy:  Group Therapy  Participation Level:  Did Not Attend-pt invited. Chose to rest in room.   Summary of Progress/Problems: Today's Topic: Overcoming Obstacles. Patients identified one short term goal and potential obstacles in reaching this goal. Patients processed barriers involved in overcoming these obstacles. Patients identified steps necessary for overcoming these obstacles and explored motivation (internal and external) for facing these difficulties head on.   Herbert Aguinaldo N Smart LCSW 08/08/2016, 11:48 AM

## 2016-08-08 NOTE — Progress Notes (Signed)
DAR NOTE: Pt present with flat affect and depressed mood in the unit. Pt has been isolating himself and has been bed most of the time. Pt stated he does not like being around people he does not know and so he could not attend the group. Pt later later said he will attend the group, but will not participate. Pt denies physical pain, took all his meds as scheduled. As per self inventory, pt had a poor night sleep, fair appetite, normal energy, and good concentration. Pt rate depression at 4, hopeless ness at 3, and anxiety at 6. [name] safety ensured with 15 minute and environmental checks. Pt currently denies SI/HI and A/V hallucinations. Pt verbally agrees to seek staff if SI/HI or A/VH occurs and to consult with staff before acting on these thoughts. Will continue POC.

## 2016-08-08 NOTE — Progress Notes (Signed)
Pt is a new admit to the unit earlier in the afternoon.  He was in his room in bed at the beginning of the shift.  When writer went to introduce self to patient, he stated he that he has social anxiety and he couldn't go to meetings.  Writer told him that he should discuss that with the doctor tomorrow.  He also said that he wanted to discharge tomorrow as he needed to work on his court case.  He has a court date in March.  Pt denies SI/HI/AVH.  He denies having any withdrawal symptoms.  He said he could "handle" the withdrawal symptoms on his own.  Pt later came out of his room and made a phone call.  In a few minutes, pt began banging the phone receiver on the wall and stormed up the hallway.  Staff went to pt to find out what had upset him.  Pt reported that his father had just told him he could not return home.  Staff was able to de-escalate the situation with the pt.  Writer informed PA of the incident and orders were received and meds given to the pt to help him calm down.  He had already receive his hs meds.  Support and encouragement offered.  Pt was encouraged to make his needs known to staff, and to discuss his discharge concerns with the doctor and CSW.  Discharge plans are in process.  Safety maintained with q15 minute checks.

## 2016-08-08 NOTE — Tx Team (Signed)
Interdisciplinary Treatment and Diagnostic Plan Update  08/09/2016 Time of Session: 0930 Adam Novak MRN: [id number]  Principal Diagnosis: Polysubstance abuse  Secondary Diagnoses: Principal Problem:   Polysubstance abuse Active Problems:   Bipolar affective disorder, depressed, severe (HCC)   Current Medications:  Current Facility-Administered Medications  Medication Dose Route Frequency Provider Last Rate Last Dose  . acetaminophen (TYLENOL) tablet 650 mg  650 mg Oral Q6H PRN Charm Rings, NP      . alum & mag hydroxide-simeth (MAALOX/MYLANTA) 200-200-20 MG/5ML suspension 30 mL  30 mL Oral Q4H PRN Charm Rings, NP      . chlordiazePOXIDE (LIBRIUM) capsule 25 mg  25 mg Oral Q6H PRN Sanjuana Kava, NP      . chlordiazePOXIDE (LIBRIUM) capsule 25 mg  25 mg Oral TID Sanjuana Kava, NP   25 mg at 08/09/16 0801   Followed by  . [START ON 08/10/2016] chlordiazePOXIDE (LIBRIUM) capsule 25 mg  25 mg Oral BH-qamhs Sanjuana Kava, NP       Followed by  . [START ON 08/11/2016] chlordiazePOXIDE (LIBRIUM) capsule 25 mg  25 mg Oral Daily Sanjuana Kava, NP      . divalproex (DEPAKOTE ER) 24 hr tablet 1,000 mg  1,000 mg Oral QHS Georgiann Cocker, MD   1,000 mg at 08/08/16 2247  . haloperidol (HALDOL) tablet 5 mg  5 mg Oral Q8H PRN Georgiann Cocker, MD       Or  . haloperidol lactate (HALDOL) injection 5 mg  5 mg Intramuscular Q8H PRN Georgiann Cocker, MD      . hydrOXYzine (ATARAX/VISTARIL) tablet 25 mg  25 mg Oral Q6H PRN Sanjuana Kava, NP   25 mg at 08/09/16 0248  . loperamide (IMODIUM) capsule 2-4 mg  2-4 mg Oral PRN Sanjuana Kava, NP      . magnesium hydroxide (MILK OF MAGNESIA) suspension 30 mL  30 mL Oral Daily PRN Charm Rings, NP      . multivitamin with minerals tablet 1 tablet  1 tablet Oral Daily Sanjuana Kava, NP   1 tablet at 08/09/16 0801  . naltrexone (DEPADE) tablet 50 mg  50 mg Oral Daily Georgiann Cocker, MD   50 mg at 08/09/16 0801  . ondansetron (ZOFRAN-ODT)  disintegrating tablet 4 mg  4 mg Oral Q6H PRN Sanjuana Kava, NP      . thiamine (B-1) injection 100 mg  100 mg Intramuscular Once Sanjuana Kava, NP      . thiamine (VITAMIN B-1) tablet 100 mg  100 mg Oral Daily Sanjuana Kava, NP   100 mg at 08/09/16 0801  . traZODone (DESYREL) tablet 50 mg  50 mg Oral QHS,MR X 1 Spencer E Simon, PA-C   50 mg at 08/08/16 2314   PTA Medications: Prescriptions Prior to Admission  Medication Sig Dispense Refill Last Dose  . divalproex (DEPAKOTE ER) 500 MG 24 hr tablet Take 1 tablet (500 mg total) by mouth at bedtime. (Patient not taking: Reported on 08/07/2016) 30 tablet 0 Not Taking at Unknown time  . hydrOXYzine (ATARAX/VISTARIL) 25 MG tablet Take 1 tablet (25 mg total) by mouth every 6 (six) hours as needed. (Patient not taking: Reported on 08/07/2016) 30 tablet 0 Not Taking at Unknown time  . Multiple Vitamin (MULTIVITAMIN WITH MINERALS) TABS tablet Take 1 tablet by mouth daily.   Past Week at Unknown time  . OLANZapine (ZYPREXA) 5 MG tablet Take 1 tablet (5  mg total) by mouth at bedtime. (Patient not taking: Reported on 08/07/2016) 30 tablet 0 Not Taking at Unknown time    Patient Stressors: Financial difficulties Legal issue Marital or family conflict Medication change or noncompliance Occupational concerns Substance abuse  Patient Strengths: Average or above average intelligence Communication skills Physical Health Supportive family/friends  Treatment Modalities: Medication Management, Group therapy, Case management,  1 to 1 session with clinician, Psychoeducation, Recreational therapy.   Physician Treatment Plan for Primary Diagnosis: Polysubstance abuse Long Term Goal(s): Improvement in symptoms so as ready for discharge Improvement in symptoms so as ready for discharge   Short Term Goals: Ability to identify changes in lifestyle to reduce recurrence of condition will improve Ability to verbalize feelings will improve Ability to disclose and  discuss suicidal ideas Ability to demonstrate self-control will improve Ability to identify and develop effective coping behaviors will improve Ability to maintain clinical measurements within normal limits will improve Compliance with prescribed medications will improve Ability to identify triggers associated with substance abuse/mental health issues will improve Ability to identify changes in lifestyle to reduce recurrence of condition will improve Ability to verbalize feelings will improve Ability to disclose and discuss suicidal ideas Ability to demonstrate self-control will improve Ability to identify and develop effective coping behaviors will improve Ability to maintain clinical measurements within normal limits will improve Compliance with prescribed medications will improve Ability to identify triggers associated with substance abuse/mental health issues will improve  Medication Management: Evaluate patient's response, side effects, and tolerance of medication regimen.  Therapeutic Interventions: 1 to 1 sessions, Unit Group sessions and Medication administration.  Evaluation of Outcomes: Progressing  Physician Treatment Plan for Secondary Diagnosis: Principal Problem:   Polysubstance abuse Active Problems:   Bipolar affective disorder, depressed, severe (HCC)  Long Term Goal(s): Improvement in symptoms so as ready for discharge Improvement in symptoms so as ready for discharge   Short Term Goals: Ability to identify changes in lifestyle to reduce recurrence of condition will improve Ability to verbalize feelings will improve Ability to disclose and discuss suicidal ideas Ability to demonstrate self-control will improve Ability to identify and develop effective coping behaviors will improve Ability to maintain clinical measurements within normal limits will improve Compliance with prescribed medications will improve Ability to identify triggers associated with substance  abuse/mental health issues will improve Ability to identify changes in lifestyle to reduce recurrence of condition will improve Ability to verbalize feelings will improve Ability to disclose and discuss suicidal ideas Ability to demonstrate self-control will improve Ability to identify and develop effective coping behaviors will improve Ability to maintain clinical measurements within normal limits will improve Compliance with prescribed medications will improve Ability to identify triggers associated with substance abuse/mental health issues will improve     Medication Management: Evaluate patient's response, side effects, and tolerance of medication regimen.  Therapeutic Interventions: 1 to 1 sessions, Unit Group sessions and Medication administration.  Evaluation of Outcomes: Progressing   RN Treatment Plan for Primary Diagnosis: Polysubstance abuse Long Term Goal(s): Knowledge of disease and therapeutic regimen to maintain health will improve  Short Term Goals: Ability to remain free from injury will improve, Ability to disclose and discuss suicidal ideas and Ability to identify and develop effective coping behaviors will improve  Medication Management: RN will administer medications as ordered by provider, will assess and evaluate patient's response and provide education to patient for prescribed medication. RN will report any adverse and/or side effects to prescribing provider.  Therapeutic Interventions: 1 on 1 counseling  sessions, Psychoeducation, Medication administration, Evaluate responses to treatment, Monitor vital signs and CBGs as ordered, Perform/monitor CIWA, COWS, AIMS and Fall Risk screenings as ordered, Perform wound care treatments as ordered.  Evaluation of Outcomes: Progressing   LCSW Treatment Plan for Primary Diagnosis: Polysubstance abuse Long Term Goal(s): Safe transition to appropriate next level of care at discharge, Engage patient in therapeutic group  addressing interpersonal concerns.  Short Term Goals: Engage patient in aftercare planning with referrals and resources, Facilitate patient progression through stages of change regarding substance use diagnoses and concerns and Identify triggers associated with mental health/substance abuse issues  Therapeutic Interventions: Assess for all discharge needs, 1 to 1 time with Social worker, Explore available resources and support systems, Assess for adequacy in community support network, Educate family and significant other(s) on suicide prevention, Complete Psychosocial Assessment, Interpersonal group therapy.  Evaluation of Outcomes: Progressing   Progress in Treatment: Attending groups: No. Participating in groups: No. Taking medication as prescribed: Yes. Toleration medication: Yes. Family/Significant other contact made: No, will contact:  family member if patient consents Patient understands diagnosis: Yes. Discussing patient identified problems/goals with staff: Yes. Medical problems stabilized or resolved: Yes. Denies suicidal/homicidal ideation: Yes. Self report.  Issues/concerns per patient self-inventory: No. Other: n/a   New problem(s) identified: Yes, Describe:  minimal insight. requesting discharge to "work on court case."   New Short Term/Long Term Goal(s): detox; medication stabilization; development of comprehensive mental wellness/sobriety plan.   Discharge Plan or Barriers: Pt was planning to return home with his father but was told last night by his father that he could not return home. CSW assessing for appropriate referrals.   Reason for Continuation of Hospitalization: Aggression Depression Medication stabilization Withdrawal symptoms  Estimated Length of Stay: 3-5 days   Attendees: Patient: 08/09/2016 8:44 AM  Physician: Dr. Jackquline BerlinIzediuno MD 08/09/2016 8:44 AM  Nursing: Derrick Ravelheresa; Jane RN 08/09/2016 8:44 AM  RN Care Manager: Onnie BoerJennifer Clark CM 08/09/2016 8:44 AM  Social  Worker: Chartered loss adjusterHeather Smart, LCSW; Donnelly StagerLynn Bryant LCSWA  08/09/2016 8:44 AM  Recreational Therapist: Juliann ParesX 08/09/2016 8:44 AM  Other: Armandina StammerAgnes Nwoko NP; Gray BernhardtMay Augustin NP 08/09/2016 8:44 AM  Other:  08/09/2016 8:44 AM  Other: 08/09/2016 8:44 AM    Scribe for Treatment Team: Ledell PeoplesHeather N Smart, LCSW 08/09/2016 8:44 AM
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
## 2016-08-08 NOTE — Progress Notes (Signed)
Recreation Therapy Notes  Date: 08/08/16 Time: 0930 Location: [address] Group Room  Group Topic: Stress Management  Goal Area(s) Addresses:  Patient will verbalize importance of using healthy stress management.  Patient will identify positive emotions associated with healthy stress management.   Intervention: Stress Management  Activity :  Meditation.  LRT played a meditation from the Calm App so patients could practice the technique of meditation.  Patients were to follow along as the meditation was played to engage in the activity.  Education:  Stress Management, Discharge Planning.   Education Outcome: Acknowledges edcuation/In group clarification offered/Needs additional education  Clinical Observations/Feedback: Pt did not attend group.    Aracelia Brinson, LRT/CTRS         Michoel Kunin A 08/08/2016 12:40 PM 

## 2016-08-09 MED ORDER — TRAZODONE HCL 50 MG PO TABS
50.0000 mg | ORAL_TABLET | Freq: Every day | ORAL | Status: DC
  Administered 2016-08-09 – 2016-08-10 (×2): 50 mg via ORAL
  Filled 2016-08-09 (×3): qty 1
  Filled 2016-08-09: qty 7

## 2016-08-09 NOTE — Progress Notes (Signed)
D:  Patient awake and alert; oriented x 4; he denies suicidal and homicidal ideation and AVH; no self-injurious behaviors noted or reported. He reports that he did not sleep well last night but that his appetite is good and that his energy level is normal. His goal today is "to try and control my anxiety" and to "try not to think about things so much." A:  Medications given as scheduled;  Emotional support provided; encouraged him to seek assistance with needs/concerns. R:  Safety maintained on unit.

## 2016-08-09 NOTE — Progress Notes (Signed)
Pt did not attend wrap-up group   

## 2016-08-09 NOTE — Progress Notes (Signed)
[facility]  08/09/2016 12:05 PM Adam DanBrandon J Novak  MRN:  [id number] Subjective:   26 yo Caucasian male, single, unemployed. Lives with his family. Presented to the ER via the police. Patient was intoxicated with alcohol. BAL 154 mg/dl. UDS was positive for cocaine, THC and benzodiazepines. His family called the police as patient was agitated at home. He was arguing with his family and was escalating. At the emergency room, he was groggy. He was agitated and required emergency medications.   Seen today. States that he is feeling better. He is not as shaky as he was yesterday. Says his thoughts are clearing up. No hallucinations. No abnormal beliefs. Patient says he really needs to start working on his addiction. Says his lawyer has encouraged him to engage in a program. Says he needs to get it done before his court date. Says he talked with his family yesterday. He feels they have worked out their differences. Patient hopes to return home upon discharge. Reports poor sleep last night. Likely effect of substances. He tolerated his medications well. No suicidal thoughts. No thoughts of violence.  Nursing staff reports that he is coming substances smoothly. No behavioral issues. Has not been able to engage with groups much. Has not groomed self yet. Poor sleep last night.  Principal Problem: Polysubstance abuse Diagnosis:   Patient Active Problem List   Diagnosis Date Noted  . Bipolar affective disorder, depressed, severe (HCC) [F31.4] 08/07/2016  . Bipolar 1 disorder (HCC) [F31.9] 12/16/2015  . Alcohol dependence with alcohol-induced mood disorder (HCC) [F10.24]   . Alcohol use disorder, severe, dependence (HCC) [F10.20] 12/15/2015  . Bipolar I disorder, most recent episode depressed (HCC) [F31.30] 05/15/2015  . Dog bite of left calf [S81.852A, W54.0XXA] 06/19/2014  . Alcohol abuse [F10.10] 06/18/2014  . Alcohol withdrawal (HCC) [F10.239] 06/18/2014  . Alcohol intoxication, episodic  (HCC) [F10.129] 06/18/2014  . Encephalopathy [G93.40] 06/18/2014  . Leukocytosis [D72.829]   . Polysubstance abuse [F19.10]   . CLOSED FRACTURE OF NECK OF METACARPAL BONE [S62.339A] 06/16/2010   Total Time spent with patient: 30 minutes  Past Psychiatric History: As in H&P  Past Medical History:  Past Medical History:  Diagnosis Date  . Bipolar 1 disorder [facility](HCC)     Past Surgical History:  Procedure Laterality Date  . KNEE SURGERY Right    Family History: History reviewed. No pertinent family history. Family Psychiatric  History: As in H&P Social History:  History  Alcohol Use  . Yes    Comment: socially     History  Drug Use  . Types: Marijuana, Benzodiazepines    Social History   Social History  . Marital status: Single    Spouse name: N/A  . Number of children: N/A  . Years of education: N/A   Social History Main Topics  . Smoking status: Current Some Day Smoker    Packs/day: 0.50    Types: Cigarettes  . Smokeless tobacco: Never Used  . Alcohol use Yes     Comment: socially  . Drug use: Yes    Types: Marijuana, Benzodiazepines  . Sexual activity: No   Other Topics Concern  . None   Social History Narrative  . None   Additional Social History:    Sleep: Poor  Appetite:  Good  Current Medications: Current Facility-Administered Medications  Medication Dose Route Frequency Provider Last Rate Last Dose  . acetaminophen (TYLENOL) tablet 650 mg  650 mg Oral Q6H PRN Charm RingsJamison Y Lord, NP      .  alum & mag hydroxide-simeth (MAALOX/MYLANTA) 200-200-20 MG/5ML suspension 30 mL  30 mL Oral Q4H PRN Charm Rings, NP      . chlordiazePOXIDE (LIBRIUM) capsule 25 mg  25 mg Oral Q6H PRN Sanjuana Kava, NP      . chlordiazePOXIDE (LIBRIUM) capsule 25 mg  25 mg Oral TID Sanjuana Kava, NP   25 mg at 08/09/16 1118   Followed by  . [START ON 08/10/2016] chlordiazePOXIDE (LIBRIUM) capsule 25 mg  25 mg Oral BH-qamhs Sanjuana Kava, NP       Followed by  . [START ON  08/11/2016] chlordiazePOXIDE (LIBRIUM) capsule 25 mg  25 mg Oral Daily Sanjuana Kava, NP      . divalproex (DEPAKOTE ER) 24 hr tablet 1,000 mg  1,000 mg Oral QHS Georgiann Cocker, MD   1,000 mg at 08/08/16 2247  . haloperidol (HALDOL) tablet 5 mg  5 mg Oral Q8H PRN Georgiann Cocker, MD       Or  . haloperidol lactate (HALDOL) injection 5 mg  5 mg Intramuscular Q8H PRN Georgiann Cocker, MD      . hydrOXYzine (ATARAX/VISTARIL) tablet 25 mg  25 mg Oral Q6H PRN Sanjuana Kava, NP   25 mg at 08/09/16 0248  . loperamide (IMODIUM) capsule 2-4 mg  2-4 mg Oral PRN Sanjuana Kava, NP      . magnesium hydroxide (MILK OF MAGNESIA) suspension 30 mL  30 mL Oral Daily PRN Charm Rings, NP      . multivitamin with minerals tablet 1 tablet  1 tablet Oral Daily Sanjuana Kava, NP   1 tablet at 08/09/16 0801  . naltrexone (DEPADE) tablet 50 mg  50 mg Oral Daily Georgiann Cocker, MD   50 mg at 08/09/16 0801  . ondansetron (ZOFRAN-ODT) disintegrating tablet 4 mg  4 mg Oral Q6H PRN Sanjuana Kava, NP      . thiamine (B-1) injection 100 mg  100 mg Intramuscular Once Sanjuana Kava, NP      . thiamine (VITAMIN B-1) tablet 100 mg  100 mg Oral Daily Sanjuana Kava, NP   100 mg at 08/09/16 0801  . traZODone (DESYREL) tablet 50 mg  50 mg Oral QHS,MR X 1 Spencer E Simon, PA-C   50 mg at 08/08/16 2314    Lab Results: No results found for this or any previous visit (from the past 48 hour(s)).  Blood Alcohol level:  Lab Results  Component Value Date   ETH 154 (H) 08/07/2016   ETH 375 (HH) 12/15/2015    Metabolic Disorder Labs: Lab Results  Component Value Date   HGBA1C 4.8 12/17/2015   MPG 91 12/17/2015   No results found for: PROLACTIN Lab Results  Component Value Date   CHOL 245 (H) 12/17/2015   TRIG 161 (H) 12/17/2015   HDL 65 12/17/2015   CHOLHDL 3.8 12/17/2015   VLDL 32 12/17/2015   LDLCALC 148 (H) 12/17/2015    Physical Findings: AIMS: Facial and Oral Movements Muscles of Facial Expression: None,  normal Lips and Perioral Area: None, normal Jaw: None, normal Tongue: None, normal,Extremity Movements Upper (arms, wrists, hands, fingers): None, normal Lower (legs, knees, ankles, toes): None, normal, Trunk Movements Neck, shoulders, hips: None, normal, Overall Severity Severity of abnormal movements (highest score from questions above): None, normal Incapacitation due to abnormal movements: None, normal Patient's awareness of abnormal movements (rate only patient's report): No Awareness, Dental Status Current problems with teeth and/or  dentures?: No Does patient usually wear dentures?: No  CIWA:  CIWA-Ar Total: 2 COWS:     Musculoskeletal: Strength & Muscle Tone: within normal limits Gait & Station: normal Patient leans: N/A  Psychiatric Specialty Exam: Physical Exam  Constitutional: He is oriented to person, place, and time. He appears well-developed and well-nourished.  HENT:  Head: Normocephalic and atraumatic.  Eyes: Conjunctivae and EOM are normal. Pupils are equal, round, and reactive to light.  Neck: Normal range of motion. Neck supple.  Cardiovascular: Normal rate, regular rhythm and normal heart sounds.   Respiratory: Effort normal and breath sounds normal.  GI: Soft. Bowel sounds are normal.  Musculoskeletal: Normal range of motion.  Neurological: He is alert and oriented to person, place, and time. He has normal reflexes.  Skin: Skin is warm and dry.  Psychiatric:  As above    ROS  Blood pressure (!) 146/95, pulse 92, temperature 97.9 F (36.6 C), temperature source Oral, resp. rate 18, height 5' 8.75" (1.746 m), weight 92.3 kg (203 lb 8 oz).Body mass index is 30.27 kg/m.  General Appearance: Malodorous, calm and cooperative. No tremors, not retching. Appropriate behavior. Not internally stimulated.   Eye Contact:  Good  Speech:  Clear and Coherent  Volume:  Normal  Mood:  Feeling better  Affect:  Appropriate and Restricted  Thought Process:  Goal Directed  and Linear  Orientation:  Full (Time, Place, and Person)  Thought Content:  No delusional theme. No preoccupation with violent thoughts. No negative ruminations. No obsession.  No hallucination in any modality.  Suicidal Thoughts:  No  Homicidal Thoughts:  No  Memory:  Immediate;   Good Recent;   Fair Remote;   Fair  Judgement:  Better   Insight:  Good  Psychomotor Activity:  Normal  Concentration:  Concentration: Fair and Attention Span: Fair  Recall:  [employer] of Knowledge:  Fair  Language:  Good  Akathisia:  No  Handed:    AIMS (if indicated):     Assets:  Desire for Improvement Physical Health  ADL's:  Impaired  Cognition:  Alert   Sleep:  Number of Hours: 4.75     Treatment Plan Summary: Patient is intoxicated with multiple substances. He is coming off alcohol smoothly. He has some insight and wants help. We discussed medications listed below respectively. Patient consented to treatment respectively   Psychiatric: SUD ?Bipolar Disorder  Medical:  Psychosocial:  Unemployed Poor social network   PLAN: 1. Continue current regimen 2. Encourage unit groups and activities 3. Monitor mood, behavior and interaction with peers 4. Encourage grooming and personal hygiene.  5. SW would gather collateral from his family 6.  SW would facilitate addiction treatment in the community 7. Possible discharge by Sunday/Monday  Georgiann Cocker, MD 08/09/2016, 12:05 PM

## 2016-08-09 NOTE — [facility] Group Notes (Signed)
[facility] 08/09/2016 1:15pm  Type of Therapy: Mental Health Association Presentation  Participation Level: Pt invited. Did not attend.   Vito BackersLynn B. Beverely PaceBryant, MSW, LCSWA 08/09/2016 2:07 PM

## 2016-08-09 NOTE — Progress Notes (Signed)
Patient ID: Adam Novak, male   DOB: 10/20/1990, 26 y.o.   MRN: [id number]  Pt currently presents with a flat affect and isolative, guarded behavior. Pt attends group but remains in room for the rest of the night, states "I don't like being around other people it gives me anxiety." Pt reports to writer that their goal is to "get some more sleep." Pt states "I didn't sleep as much as I did yesterday during the day" Pt reports poor sleep with current medication regimen.   Pt provided with medications per providers orders. [name] labs and vitals were monitored throughout the night. Pt given a 1:1 about emotional and mental status. Pt supported and encouraged to express concerns and questions. Pt educated on medications and assertiveness techniques.   [name] safety ensured with 15 minute and environmental checks. Pt currently denies SI/HI and A/V hallucinations. Pt verbally agrees to seek staff if SI/HI or A/VH occurs and to consult with staff before acting on any harmful thoughts. Will continue POC.

## 2016-08-09 NOTE — Plan of Care (Signed)
Problem: Safety: Goal: Periods of time without injury will increase Outcome: Progressing Safety maintained on unit  Problem: Medication: Goal: Compliance with prescribed medication regimen will improve Outcome: Progressing Compliant with medication regimen  Problem: Self-Concept: Goal: Ability to disclose and discuss suicidal ideas will improve Outcome: Progressing Denies SI/HI/AVH

## 2016-08-10 NOTE — [facility] Group Notes (Signed)
[facility] LCSW Group Therapy  08/10/2016 12:58 PM  Type of Therapy:  Group Therapy  Participation Level:  Active  Participation Quality:  Attentive  Affect:  Appropriate  Cognitive:  Alert and Oriented  Insight:  Improving  Engagement in Therapy:  Improving  Modes of Intervention:  Confrontation, Discussion, Education, Problem-solving, Socialization and Support  Summary of Progress/Problems: Self Sabotage: Patients were asked to identify examples of self-sabotoge that they have experienced and talk about how to avoid this behavior in the future.   Breckin Savannah N Smart LCSW 08/10/2016, 12:58 PM

## 2016-08-10 NOTE — Progress Notes (Signed)
Nursing Note 08/10/2016 [id number]  Data Reports sleeping poor with PRN sleep med.  Rates depression 0/10, hopelessness 0/10, and anxiety 1/10. Affect blunted.  Denies HI, SI, AVH.  Isolating in room during AM.   Action Spoke with patient 1:1, nurse offered support to patient throughout shift.  RN spoke with patient about isolation, patient said he has social anxiety, we discussed plan to take a PRN vistaril before group.  Given vistaril before group.  Continues to be monitored on 15 minute checks for safety.  Response Patient went to group in afternoon, states he was glad he went.  Reports vistaril was helpful.  Remains safe on unit.

## 2016-08-10 NOTE — Progress Notes (Signed)
D.  Pt pleasant on approach, did not get out of bed for group this evening.  Pt states he was sleeping hard and it took him a minute to wake up.  Pt denies SI/HI/hallucinations at this time.  Pt states he is to be discharged tomorrow, feels ready.  A.  Support and encouragement offered, medication given as ordered  R.  Pt remains safe on the unit, will continue to monitor.

## 2016-08-10 NOTE — Tx Team (Signed)
Interdisciplinary Treatment and Diagnostic Plan Update  08/10/2016 Time of Session: 0930 Adam Novak MRN: [id number]  Principal Diagnosis: Polysubstance abuse  Secondary Diagnoses: Principal Problem:   Polysubstance abuse Active Problems:   Bipolar affective disorder, depressed, severe (HCC)   Current Medications:  Current Facility-Administered Medications  Medication Dose Route Frequency Provider Last Rate Last Dose  . acetaminophen (TYLENOL) tablet 650 mg  650 mg Oral Q6H PRN [name], NP      . alum & mag hydroxide-simeth (MAALOX/MYLANTA) 200-200-20 MG/5ML suspension 30 mL  30 mL Oral Q4H PRN [name], NP      . chlordiazePOXIDE (LIBRIUM) capsule 25 mg  25 mg Oral Q6H PRN [name], NP   25 mg at 08/10/16 0142  . chlordiazePOXIDE (LIBRIUM) capsule 25 mg  25 mg Oral BH-qamhs [name], NP   25 mg at 08/10/16 0818   Followed by  . [START ON 08/11/2016] chlordiazePOXIDE (LIBRIUM) capsule 25 mg  25 mg Oral Daily [name], NP      . divalproex (DEPAKOTE ER) 24 hr tablet 1,000 mg  1,000 mg Oral QHS [name], MD   1,000 mg at 08/09/16 2218  . haloperidol (HALDOL) tablet 5 mg  5 mg Oral Q8H PRN [name], MD       Or  . haloperidol lactate (HALDOL) injection 5 mg  5 mg Intramuscular Q8H PRN [name], MD      . hydrOXYzine (ATARAX/VISTARIL) tablet 25 mg  25 mg Oral Q6H PRN [name], NP   25 mg at 08/09/16 2218  . loperamide (IMODIUM) capsule 2-4 mg  2-4 mg Oral PRN [name], NP      . magnesium hydroxide (MILK OF MAGNESIA) suspension 30 mL  30 mL Oral Daily PRN [name], NP      . multivitamin with minerals tablet 1 tablet  1 tablet Oral Daily [name], NP   1 tablet at 08/10/16 0819  . naltrexone (DEPADE) tablet 50 mg  50 mg Oral Daily [name], MD   50 mg at 08/10/16 0819  . ondansetron (ZOFRAN-ODT) disintegrating tablet 4 mg  4 mg Oral Q6H PRN [name], NP      . thiamine (B-1) injection 100 mg   100 mg Intramuscular Once [name], NP      . thiamine (VITAMIN B-1) tablet 100 mg  100 mg Oral Daily [name], NP   100 mg at 08/10/16 0819  . traZODone (DESYREL) tablet 50 mg  50 mg Oral QHS [name], MD   50 mg at 08/09/16 2218   PTA Medications: Prescriptions Prior to Admission  Medication Sig Dispense Refill Last Dose  . divalproex (DEPAKOTE ER) 500 MG 24 hr tablet Take 1 tablet (500 mg total) by mouth at bedtime. (Patient not taking: Reported on 08/07/2016) 30 tablet 0 Not Taking at Unknown time  . hydrOXYzine (ATARAX/VISTARIL) 25 MG tablet Take 1 tablet (25 mg total) by mouth every 6 (six) hours as needed. (Patient not taking: Reported on 08/07/2016) 30 tablet 0 Not Taking at Unknown time  . Multiple Vitamin (MULTIVITAMIN WITH MINERALS) TABS tablet Take 1 tablet by mouth daily.   Past Week at Unknown time  . OLANZapine (ZYPREXA) 5 MG tablet Take 1 tablet (5 mg total) by mouth at bedtime. (Patient not taking: Reported on 08/07/2016) 30 tablet 0 Not Taking at Unknown time    Patient Stressors: Museum/gallery curator  difficulties Legal issue Marital or family conflict Medication change or noncompliance Occupational concerns Substance abuse  Patient Strengths: Average or above average intelligence Communication skills Physical Health Supportive family/friends  Treatment Modalities: Medication Management, Group therapy, Case management,  1 to 1 session with clinician, Psychoeducation, Recreational therapy.   Physician Treatment Plan for Primary Diagnosis: Polysubstance abuse Long Term Goal(s): Improvement in symptoms so as ready for discharge Improvement in symptoms so as ready for discharge   Short Term Goals: Ability to identify changes in lifestyle to reduce recurrence of condition will improve Ability to verbalize feelings will improve Ability to disclose and discuss suicidal ideas Ability to demonstrate self-control will improve Ability to identify and develop  effective coping behaviors will improve Ability to maintain clinical measurements within normal limits will improve Compliance with prescribed medications will improve Ability to identify triggers associated with substance abuse/mental health issues will improve Ability to identify changes in lifestyle to reduce recurrence of condition will improve Ability to verbalize feelings will improve Ability to disclose and discuss suicidal ideas Ability to demonstrate self-control will improve Ability to identify and develop effective coping behaviors will improve Ability to maintain clinical measurements within normal limits will improve Compliance with prescribed medications will improve Ability to identify triggers associated with substance abuse/mental health issues will improve  Medication Management: Evaluate patient's response, side effects, and tolerance of medication regimen.  Therapeutic Interventions: 1 to 1 sessions, Unit Group sessions and Medication administration.  Evaluation of Outcomes: Met/adequate for discharge   Physician Treatment Plan for Secondary Diagnosis: Principal Problem:   Polysubstance abuse Active Problems:   Bipolar affective disorder, depressed, severe ([place])  Long Term Goal(s): Improvement in symptoms so as ready for discharge Improvement in symptoms so as ready for discharge   Short Term Goals: Ability to identify changes in lifestyle to reduce recurrence of condition will improve Ability to verbalize feelings will improve Ability to disclose and discuss suicidal ideas Ability to demonstrate self-control will improve Ability to identify and develop effective coping behaviors will improve Ability to maintain clinical measurements within normal limits will improve Compliance with prescribed medications will improve Ability to identify triggers associated with substance abuse/mental health issues will improve Ability to identify changes in lifestyle to reduce  recurrence of condition will improve Ability to verbalize feelings will improve Ability to disclose and discuss suicidal ideas Ability to demonstrate self-control will improve Ability to identify and develop effective coping behaviors will improve Ability to maintain clinical measurements within normal limits will improve Compliance with prescribed medications will improve Ability to identify triggers associated with substance abuse/mental health issues will improve     Medication Management: Evaluate patient's response, side effects, and tolerance of medication regimen.  Therapeutic Interventions: 1 to 1 sessions, Unit Group sessions and Medication administration.  Evaluation of Outcomes: Met   RN Treatment Plan for Primary Diagnosis: Polysubstance abuse Long Term Goal(s): Knowledge of disease and therapeutic regimen to maintain health will improve  Short Term Goals: Ability to remain free from injury will improve, Ability to disclose and discuss suicidal ideas and Ability to identify and develop effective coping behaviors will improve  Medication Management: RN will administer medications as ordered by provider, will assess and evaluate patient's response and provide education to patient for prescribed medication. RN will report any adverse and/or side effects to prescribing provider.  Therapeutic Interventions: 1 on 1 counseling sessions, Psychoeducation, Medication administration, Evaluate responses to treatment, Monitor vital signs and CBGs as ordered, Perform/monitor CIWA, COWS, AIMS and Fall Risk screenings  as ordered, Perform wound care treatments as ordered.  Evaluation of Outcomes: Met   LCSW Treatment Plan for Primary Diagnosis: Polysubstance abuse Long Term Goal(s): Safe transition to appropriate next level of care at discharge, Engage patient in therapeutic group addressing interpersonal concerns.  Short Term Goals: Engage patient in aftercare planning with referrals and  resources, Facilitate patient progression through stages of change regarding substance use diagnoses and concerns and Identify triggers associated with mental health/substance abuse issues  Therapeutic Interventions: Assess for all discharge needs, 1 to 1 time with Social worker, Explore available resources and support systems, Assess for adequacy in community support network, Educate family and significant other(s) on suicide prevention, Complete Psychosocial Assessment, Interpersonal group therapy.  Evaluation of Outcomes: Met/adequate for discharge   Progress in Treatment: Attending groups: No-pt isolates in room. Reports severe social anxiety but encouraged to attend.  Participating in groups: No. Taking medication as prescribed: Yes. Toleration medication: Yes. Family/Significant other contact made: SPE completed with pt's father.  Patient understands diagnosis: Yes. Discussing patient identified problems/goals with staff: Yes. Medical problems stabilized or resolved: Yes. Denies suicidal/homicidal ideation: Yes. Self report.  Issues/concerns per patient self-inventory: No. Other: n/a   New problem(s) identified: Yes, Describe:  minimal insight. requesting discharge to "work on court case."   New Short Term/Long Term Goal(s): detox; medication stabilization; development of comprehensive mental wellness/sobriety plan.   Discharge Plan or Barriers: Pt able to return home with father--CSW confirmed with pt's father. [facility] for outpatient mental health services. Pt also given AA/NA list for [place] for additional community resources.   Reason for Continuation of Hospitalization: medication management  Estimated Length of Stay: 1 day (scheduled for discharge on Saturday after 1pm).  Attendees: Patient: 08/10/2016 10:31 AM  Physician: Dr. Sanjuana Letters MD 08/10/2016 10:31 AM  Nursing: Resa Miner RN 08/10/2016 10:31 AM  RN Care  Manager: Lars Pinks CM 08/10/2016 10:31 AM  Social Worker: Maxie Better, LCSW; 08/10/2016 10:31 AM  Recreational Therapist: Rhunette Croft 08/10/2016 10:31 AM  Other: Lindell Spar NP; Ricky Ala NP 08/10/2016 10:31 AM  Other:  08/10/2016 10:31 AM  Other: 08/10/2016 10:31 AM    Scribe for Treatment Team: [place], LCSW 08/10/2016 10:31 AM

## 2016-08-10 NOTE — Progress Notes (Signed)
  [facility] :  Will you be returning to the same living situation after discharge:  Yes,  home At discharge, do you have transportation home?: Yes,  pt's father picking him up at 1:00PM on Saturday. Do you have the ability to pay for your medications: Yes,  mental health  Release of information consent forms completed and submitted to medical records by CSW.  Patient to Follow up at: Follow-up Information    [facility] Follow up.   Specialty:  Behavioral Health Why:  Walk in between 8am-9am Monday through Friday for hospital follow-up/medication management/assessment for counseling services. Thank you.  Contact information: [address] [place] [address] [phone]           Next level [facility] care [facility]no  Safety Planning and Suicide Prevention discussed: Yes,  SPE completed with pt's father  Have you used any form of tobacco in the last 30 days? (Cigarettes, Smokeless Tobacco, Cigars, and/or Pipes): No  Has patient been referred to the Quitline?: N/A patient is not a smoker  Patient has been referred for addiction treatment: Yes  Pul[place] HomesHeather N Smart LCSW 08/10/2016, 10:29 AM

## 2016-08-10 NOTE — Progress Notes (Signed)
[facility]  08/10/2016 2:48 PM Adam DanBrandon J Mccleary  MRN:  [id number] Subjective:   26 yo Caucasian male, single, unemployed. Lives with his family. Presented to the ER via the police. Patient was intoxicated with alcohol. BAL 154 mg/dl. UDS was positive for cocaine, THC and benzodiazepines. His family called the police as patient was agitated at home. He was arguing with his family and was escalating. At the emergency room, he was groggy. He was agitated and required emergency medications.   Seen today. Feeling better today. Says he has never felt this good in years. Patient states that he now has insight into how alcohol affects him. Says he is focused on the community program that would help him deal with his legal issues. Patient was at group prior to interview. Says under the influence, he would not have been able to tolerate group. He is proud of himself as he felt comfortable at group. No thoughts of suicide. No thoughts of violence. Says he is able to think clearer now. No craving for alcohol or substances.  SW spoke with his parents. They are okay with him coming back home tomorrow. No concerns about saftey.  Nursing staff reports that he is more interactive. He groomed self today. No behavioral issues.   Principal Problem: Polysubstance abuse Diagnosis:   Patient Active Problem List   Diagnosis Date Noted  . Bipolar affective disorder, depressed, severe (HCC) [F31.4] 08/07/2016  . Bipolar 1 disorder (HCC) [F31.9] 12/16/2015  . Alcohol dependence with alcohol-induced mood disorder (HCC) [F10.24]   . Alcohol use disorder, severe, dependence (HCC) [F10.20] 12/15/2015  . Bipolar I disorder, most recent episode depressed (HCC) [F31.30] 05/15/2015  . Dog bite of left calf [S81.852A, W54.0XXA] 06/19/2014  . Alcohol abuse [F10.10] 06/18/2014  . Alcohol withdrawal (HCC) [F10.239] 06/18/2014  . Alcohol intoxication, episodic (HCC) [F10.129] 06/18/2014  . Encephalopathy [G93.40]  06/18/2014  . Leukocytosis [D72.829]   . Polysubstance abuse [F19.10]   . CLOSED FRACTURE OF NECK OF METACARPAL BONE [S62.339A] 06/16/2010   Total Time spent with patient: 30 minutes  Past Psychiatric History: As in H&P  Past Medical History:  Past Medical History:  Diagnosis Date  . Bipolar 1 disorder [facility](HCC)     Past Surgical History:  Procedure Laterality Date  . KNEE SURGERY Right    Family History: History reviewed. No pertinent family history. Family Psychiatric  History: As in H&P Social History:  History  Alcohol Use  . Yes    Comment: socially     History  Drug Use  . Types: Marijuana, Benzodiazepines    Social History   Social History  . Marital status: Single    Spouse name: N/A  . Number of children: N/A  . Years of education: N/A   Social History Main Topics  . Smoking status: Current Some Day Smoker    Packs/day: 0.50    Types: Cigarettes  . Smokeless tobacco: Never Used  . Alcohol use Yes     Comment: socially  . Drug use: Yes    Types: Marijuana, Benzodiazepines  . Sexual activity: No   Other Topics Concern  . None   Social History Narrative  . None   Additional Social History:    Sleep: Good. Slept most of the day yesterday.  Appetite:  Good  Current Medications: Current Facility-Administered Medications  Medication Dose Route Frequency Provider Last Rate Last Dose  . acetaminophen (TYLENOL) tablet 650 mg  650 mg Oral Q6H PRN Charm RingsJamison Y Lord, NP      .  alum & mag hydroxide-simeth (MAALOX/MYLANTA) 200-200-20 MG/5ML suspension 30 mL  30 mL Oral Q4H PRN Charm Rings, NP      . chlordiazePOXIDE (LIBRIUM) capsule 25 mg  25 mg Oral Q6H PRN Sanjuana Kava, NP   25 mg at 08/10/16 0142  . chlordiazePOXIDE (LIBRIUM) capsule 25 mg  25 mg Oral BH-qamhs Sanjuana Kava, NP   25 mg at 08/10/16 0818   Followed by  . [START ON 08/11/2016] chlordiazePOXIDE (LIBRIUM) capsule 25 mg  25 mg Oral Daily Sanjuana Kava, NP      . divalproex (DEPAKOTE ER) 24 hr  tablet 1,000 mg  1,000 mg Oral QHS Georgiann Cocker, MD   1,000 mg at 08/09/16 2218  . haloperidol (HALDOL) tablet 5 mg  5 mg Oral Q8H PRN Georgiann Cocker, MD       Or  . haloperidol lactate (HALDOL) injection 5 mg  5 mg Intramuscular Q8H PRN Georgiann Cocker, MD      . hydrOXYzine (ATARAX/VISTARIL) tablet 25 mg  25 mg Oral Q6H PRN Sanjuana Kava, NP   25 mg at 08/10/16 1317  . loperamide (IMODIUM) capsule 2-4 mg  2-4 mg Oral PRN Sanjuana Kava, NP      . magnesium hydroxide (MILK OF MAGNESIA) suspension 30 mL  30 mL Oral Daily PRN Charm Rings, NP      . multivitamin with minerals tablet 1 tablet  1 tablet Oral Daily Sanjuana Kava, NP   1 tablet at 08/10/16 0819  . naltrexone (DEPADE) tablet 50 mg  50 mg Oral Daily Georgiann Cocker, MD   50 mg at 08/10/16 0819  . ondansetron (ZOFRAN-ODT) disintegrating tablet 4 mg  4 mg Oral Q6H PRN Sanjuana Kava, NP      . thiamine (B-1) injection 100 mg  100 mg Intramuscular Once Sanjuana Kava, NP      . thiamine (VITAMIN B-1) tablet 100 mg  100 mg Oral Daily Sanjuana Kava, NP   100 mg at 08/10/16 0819  . traZODone (DESYREL) tablet 50 mg  50 mg Oral QHS Georgiann Cocker, MD   50 mg at 08/09/16 2218    Lab Results: No results found for this or any previous visit (from the past 48 hour(s)).  Blood Alcohol level:  Lab Results  Component Value Date   ETH 154 (H) 08/07/2016   ETH 375 (HH) 12/15/2015    Metabolic Disorder Labs: Lab Results  Component Value Date   HGBA1C 4.8 12/17/2015   MPG 91 12/17/2015   No results found for: PROLACTIN Lab Results  Component Value Date   CHOL 245 (H) 12/17/2015   TRIG 161 (H) 12/17/2015   HDL 65 12/17/2015   CHOLHDL 3.8 12/17/2015   VLDL 32 12/17/2015   LDLCALC 148 (H) 12/17/2015    Physical Findings: AIMS: Facial and Oral Movements Muscles of Facial Expression: None, normal Lips and Perioral Area: None, normal Jaw: None, normal Tongue: None, normal,Extremity Movements Upper (arms, wrists,  hands, fingers): None, normal Lower (legs, knees, ankles, toes): None, normal, Trunk Movements Neck, shoulders, hips: None, normal, Overall Severity Severity of abnormal movements (highest score from questions above): None, normal Incapacitation due to abnormal movements: None, normal Patient's awareness of abnormal movements (rate only patient's report): No Awareness, Dental Status Current problems with teeth and/or dentures?: No Does patient usually wear dentures?: No  CIWA:  CIWA-Ar Total: 1 COWS:     Musculoskeletal: Strength & Muscle Tone: within normal  limits Gait & Station: normal Patient leans: N/A  Psychiatric Specialty Exam: Physical Exam  Constitutional: He is oriented to person, place, and time. He appears well-developed and well-nourished.  HENT:  Head: Normocephalic and atraumatic.  Eyes: Conjunctivae and EOM are normal. Pupils are equal, round, and reactive to light.  Neck: Normal range of motion. Neck supple.  Cardiovascular: Normal rate, regular rhythm and normal heart sounds.   Respiratory: Effort normal and breath sounds normal.  GI: Soft. Bowel sounds are normal.  Musculoskeletal: Normal range of motion.  Neurological: He is alert and oriented to person, place, and time. He has normal reflexes.  Skin: Skin is warm and dry.  Psychiatric:  As above    ROS  Blood pressure (!) 143/93, pulse 83, temperature 98.4 F (36.9 C), temperature source Oral, resp. rate 18, height 5' 8.75" (1.746 m), weight 92.3 kg (203 lb 8 oz).Body mass index is 30.27 kg/m.  General Appearance: Neatly dressed, pleasant, engaging well and cooperative. Appropriate behavior. Not in any distress. Good relatedness. Not internally stimulated  Eye Contact:  Good  Speech:  Spontaneous, normal prosody. Normal tone and rate.   Volume:  Normal  Mood:  Euthymic    Affect:  Full range and appropriate  Thought Process:  Goal Directed and Linear  Orientation:  Full (Time, Place, and Person)   Thought Content:  No delusional theme. No preoccupation with violent thoughts. No negative ruminations. No obsession.  No hallucination in any modality.  Suicidal Thoughts:  No  Homicidal Thoughts:  No  Memory:  WNL  Judgement:  Good  Insight:  Good  Psychomotor Activity:  Normal  Concentration:  WNL  Recall:  Good  Fund of Knowledge:  Fair  Language:  Good  Akathisia:  No  Handed:    AIMS (if indicated):     Assets:  Desire for Improvement Physical Health  ADL's:  Impaired  Cognition:  Alert   Sleep:  Number of Hours: 2.5     Treatment Plan Summary: Patient hs detoxed from alcohol. He is mor pleasant and engaging. He has started grooming self again. No dangerousness. He is scheduled for discharge tomorrow.   Psychiatric: SUD ?Bipolar Disorder  Medical:  Psychosocial:  Unemployed Poor social network   PLAN: 1. Continue current regimen 2. Encourage unit groups and activities 3. Monitor mood, behavior and interaction with peers 4. For discharge tomorrow.   Georgiann Cocker, MD 08/10/2016, 2:48 PMPatient ID: Adam Novak, male   DOB: 04/17/1991, 26 y.o.   MRN: [id number]

## 2016-08-10 NOTE — [facility] Suicide Risk Assessment (Signed)
[facility] INPATIENT:  Family/Significant Other Suicide Prevention Education  Suicide Prevention Education:  Education Completed; Adam Novak (pt's father) [phone]  has been identified by the patient as the family member/significant other with whom the patient will be residing, and identified as the person(s) who will aid the patient in the event of a mental health crisis (suicidal ideations/suicide attempt).  With written consent from the patient, the family member/significant other has been provided the following suicide prevention education, prior to the and/or following the discharge of the patient.  The suicide prevention education provided includes the following:  Suicide risk factors  Suicide prevention and interventions  National Suicide Hotline telephone number  [facility] assessment telephone number  [facility] Emergency Assistance 911  [facility] and/or [facility]idential Mobile Crisis Unit telephone number  Request made of family/significant other to:  Remove weapons (e.g., guns, rifles, knives), all items previously/currently identified as safety concern.    Remove drugs/medications (over-the-counter, prescriptions, illicit drugs), all items previously/currently identified as a safety concern.  The family member/significant other verbalizes understanding of the suicide prevention education information provided.  The family member/significant other agrees to remove the items of safety concern listed above.  [name] father also confirmed that pt is able to return home at discharge and will pick him up on Saturday "after lunch."   Pulte HomesHeather N Smart LCSW 08/10/2016, 10:29 AM

## 2016-08-10 NOTE — Progress Notes (Signed)
Recreation Therapy Notes  Date: 08/10/16 Time: 0930 Location: [address] Dayroom  Group Topic: Stress Management  Goal Area(s) Addresses:  Patient will verbalize importance of using healthy stress management.  Patient will identify positive emotions associated with healthy stress management.   Intervention: Stress Management  Activity :  Meditation.  LRT introduced the stress management technique of meditation.  LRT played a meditation on resilience from the Calm app to allow the patients to engage in the technique.  Patients were to follow along as the meditation played to participate in the technique.   Education:  Stress Management, Discharge Planning.   Education Outcome: Acknowledges edcuation/In group clarification offered/Needs additional education  Clinical Observations/Feedback: Pt did not attend group.   Loralei Radcliffe, LRT/CTRS         Krue Peterka A 08/10/2016 12:24 PM 

## 2016-08-11 MED ORDER — DIVALPROEX SODIUM ER 500 MG PO TB24: 1000.0000 mg | ORAL_TABLET | Freq: Every day | ORAL | 0 refills | Status: DC

## 2016-08-11 MED ORDER — TRAZODONE HCL 50 MG PO TABS: 50.0000 mg | ORAL_TABLET | Freq: Every day | ORAL | 0 refills | Status: DC

## 2016-08-11 MED ORDER — NALTREXONE HCL 50 MG PO TABS: 50.0000 mg | ORAL_TABLET | Freq: Every day | ORAL | 0 refills | Status: DC

## 2016-08-11 NOTE — [facility] Suicide Risk Assessment (Signed)
[facility]   Principal Problem: Polysubstance abuse Discharge Diagnoses:  Patient Active Problem List   Diagnosis Date Noted  . Bipolar affective disorder, depressed, severe (HCC) [F31.4] 08/07/2016  . Bipolar 1 disorder (HCC) [F31.9] 12/16/2015  . Alcohol dependence with alcohol-induced mood disorder (HCC) [F10.24]   . Alcohol use disorder, severe, dependence (HCC) [F10.20] 12/15/2015  . Bipolar I disorder, most recent episode depressed (HCC) [F31.30] 05/15/2015  . Dog bite of left calf [S81.852A, W54.0XXA] 06/19/2014  . Alcohol abuse [F10.10] 06/18/2014  . Alcohol withdrawal (HCC) [F10.239] 06/18/2014  . Alcohol intoxication, episodic (HCC) [F10.129] 06/18/2014  . Encephalopathy [G93.40] 06/18/2014  . Leukocytosis [D72.829]   . Polysubstance abuse [F19.10]   . CLOSED FRACTURE OF NECK OF METACARPAL BONE [S62.339A] 06/16/2010    Total Time spent with patient: 30 minutes  Musculoskeletal: Strength & Muscle Tone: within normal limits Gait & Station: normal Patient leans: N/A  Psychiatric Specialty Exam: Review of Systems  Constitutional: Negative.   HENT: Negative.   Eyes: Negative.   Respiratory: Negative.   Cardiovascular: Negative.   Gastrointestinal: Negative.   Genitourinary: Negative.   Musculoskeletal: Negative.   Skin: Negative.   Neurological: Negative.   Endo/Heme/Allergies: Negative.   Psychiatric/Behavioral: Negative for depression, hallucinations, memory loss, substance abuse and suicidal ideas. The patient is not nervous/anxious and does not have insomnia.     Blood pressure (!) 143/93, pulse 83, temperature 98.4 F (36.9 C), temperature source Oral, resp. rate 18, height 5' 8.75" (1.746 m), weight 92.3 kg (203 lb 8 oz).Body mass index is 30.27 kg/m.  General Appearance: Neatly dressed, pleasant, engaging well and cooperative. Appropriate behavior. Not in any distress. Good relatedness. Not internally stimulated  Eye Contact::   Good  Speech:  Spontaneous, normal prosody. Normal tone and rate.   Volume:  Normal  Mood:  Euthymic  Affect:  Appropriate and Full Range  Thought Process:  Goal Directed and Linear  Orientation:  Full (Time, Place, and Person)  Thought Content:  No delusional theme. No preoccupation with violent thoughts. No negative ruminations. No obsession.  No hallucination in any modality.   Suicidal Thoughts:  No  Homicidal Thoughts:  No  Memory:  Immediate;   Good Recent;   Good Remote;   Good  Judgement:  Good  Insight:  Good  Psychomotor Activity:  Normal  Concentration:  Good  Recall:  Good  Fund of Knowledge:Good  Language: Good  Akathisia:  No  Handed:    AIMS (if indicated):     Assets:  Communication Skills Desire for Improvement Housing Physical Health Social Support  Sleep:  Number of Hours: 4  Cognition: WNL  ADL's:  Intact   Clinical  Assessment::   26 yo Caucasian male, single, unemployed. Lives with his family. Presented to the ER via the police. Patient was intoxicated with alcohol. BAL 154 mg/dl. UDS was positive for cocaine, THC and benzodiazepines. His family called the police as patient was agitated at home. He was arguing with his family and was escalating. At the emergency room, he was groggy. He was agitated and required emergency medications.   Seen today. Pleased he came for help. States that he is now able to think clearly. He has realized the effects of substances on his life. Says he wants to get his life back together. He is in good spirits. He has normal energy. He has interest in things and enjoys being around people. Says he felt comfortable at groups. His thoughts are not racing. No  irritability. No perceptual abnormality. No thoughts of violence. No thoughts of suicide. No thoughts of homicide. Patient has reestablish good relationship with his family. His father would be picking him up today. No craving for substances. He is tolerating his medications  well.  Nursing staff reports that patient has been appropriate on the unit. Patient has been interacting well with peers. No behavioral issues. Patient has not voiced any suicidal thoughts. Patient has not been observed to be internally stimulated. Patient has been adherent with treatment recommendations. Patient has been tolerating their medication well.   Treatment team agrees that he is back to his baseline. Team agrees with discharge plan  Demographic Factors:  Male  Loss Factors: Legal issues  Historical Factors: Impulsivity  Risk Reduction Factors:   Sense of responsibility to family, Living with another person, especially a relative, Positive social support, Positive therapeutic relationship and Positive coping skills or problem solving skills  Continued Clinical Symptoms:  Symptoms that he presented with has completely resolved.   Cognitive Features That Contribute To Risk:  None    Suicide Risk:  Minimal: No identifiable suicidal ideation.   Patient is not having any thoughts of suicide at this time. Modifiable risk factors targeted during this admission includes mood and substance use. Demographical and historical risk factors cannot be modified. Patient is now engaging well. Patient is reliable and is future oriented. We have buffered patient's support structures. At this point, patient is at low risk of suicide. Patient is aware of the effects of psychoactive substances on decision making process. Patient has been provided with emergency contacts. Patient acknowledges to use resources provided if unforseen circumstances changes their current risk stratification.    Follow-up Information    [facility] up.   Specialty:  Behavioral Health Why:  Walk in between 8am-9am Monday through Friday for hospital follow-up/medication management/assessment for counseling services. Thank you.  Contact information: [address] [place] [address] [phone]            Plan Of Care/Follow-up recommendations:  1. Continue current psychotropic medications 2. Mental health and addiction follow up as arranged.  3. Discharge in care of their family 4. Provided limited quantity of prescriptions   Georgiann Cocker, MD 08/11/2016, 9:36 AM
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
## 2016-08-11 NOTE — Progress Notes (Signed)
Nursing Discharge Note 08/11/2016 [id number]  Data Reports sleeping good with PRN sleep med.  Rates depression 0/10, hopelessness 0/10, and anxiety 2/10. Affect blunted mood euthymic.  Denies HI, SI, AVH.  Patient reports he slept better last night and stated "I'm feeling much better since I came here, I have a lot more energy, I don't want to go back to drinking when I leave."  Received discharge orders.  Action Spoke with patient 1:1, nurse offered support to patient throughout shift.  Reviewed medications, discharge instructions, and follow up appointments with patient. Medication samples and scripts reviewed and given to patient.  Paperwork, AVS, SRA, and transition record handed to patient.   Escorted off of unit at 1315. Belongings returned per belongings form.  Discharged to lobby, patient given bus pass.    Response Verbalized understanding of discharge teaching. Agrees to contact someone or 911 with thoughts/intent to harm self or others.    To follow up per AVS.

## 2016-08-30 ENCOUNTER — Emergency Department (HOSPITAL_COMMUNITY)
Admission: EM | Admit: 2016-08-30 | Discharge: 2016-08-31 | Disposition: A | Discharge status: Home / Self Care | Payer: Federal, State, Local not specified - Other | Attending: Emergency Medicine | Admitting: Emergency Medicine

## 2016-08-30 ENCOUNTER — Encounter (HOSPITAL_COMMUNITY): Payer: Self-pay | Admitting: Nurse Practitioner

## 2016-08-30 DIAGNOSIS — R45850 Homicidal ideations: Secondary | ICD-10-CM

## 2016-08-30 DIAGNOSIS — F1910 Other psychoactive substance abuse, uncomplicated: Secondary | ICD-10-CM | POA: Diagnosis present

## 2016-08-30 DIAGNOSIS — F1024 Alcohol dependence with alcohol-induced mood disorder: Secondary | ICD-10-CM | POA: Insufficient documentation

## 2016-08-30 DIAGNOSIS — F17210 Nicotine dependence, cigarettes, uncomplicated: Secondary | ICD-10-CM | POA: Insufficient documentation

## 2016-08-30 DIAGNOSIS — Z79899 Other long term (current) drug therapy: Secondary | ICD-10-CM | POA: Insufficient documentation

## 2016-08-30 DIAGNOSIS — F1010 Alcohol abuse, uncomplicated: Secondary | ICD-10-CM

## 2016-08-30 LAB — CBC WITH DIFFERENTIAL/PLATELET
BASOS ABS: 0 10*3/uL (ref 0.0–0.1)
Basophils Relative: 1 %
EOS PCT: 1 %
Eosinophils Absolute: 0.1 10*3/uL (ref 0.0–0.7)
HCT: 46.4 % (ref 39.0–52.0)
Hemoglobin: 16 g/dL (ref 13.0–17.0)
LYMPHS PCT: 22 %
Lymphs Abs: 1.7 10*3/uL (ref 0.7–4.0)
MCH: 33.2 pg (ref 26.0–34.0)
MCHC: 34.5 g/dL (ref 30.0–36.0)
MCV: 96.3 fL (ref 78.0–100.0)
MONO ABS: 0.3 10*3/uL (ref 0.1–1.0)
MONOS PCT: 3 %
Neutro Abs: 5.6 10*3/uL (ref 1.7–7.7)
Neutrophils Relative %: 73 %
Platelets: 320 10*3/uL (ref 150–400)
RBC: 4.82 MIL/uL (ref 4.22–5.81)
RDW: 13.1 % (ref 11.5–15.5)
WBC: 7.6 10*3/uL (ref 4.0–10.5)

## 2016-08-30 LAB — COMPREHENSIVE METABOLIC PANEL
ALT: 36 U/L (ref 17–63)
ANION GAP: 10 (ref 5–15)
AST: 36 U/L (ref 15–41)
Albumin: 5 g/dL (ref 3.5–5.0)
Alkaline Phosphatase: 84 U/L (ref 38–126)
BUN: 9 mg/dL (ref 6–20)
CHLORIDE: 105 mmol/L (ref 101–111)
CO2: 27 mmol/L (ref 22–32)
Calcium: 9 mg/dL (ref 8.9–10.3)
Creatinine, Ser: 1.02 mg/dL (ref 0.61–1.24)
Glucose, Bld: 123 mg/dL — ABNORMAL HIGH (ref 65–99)
POTASSIUM: 3.7 mmol/L (ref 3.5–5.1)
Sodium: 142 mmol/L (ref 135–145)
TOTAL PROTEIN: 8.6 g/dL — AB (ref 6.5–8.1)
Total Bilirubin: 0.6 mg/dL (ref 0.3–1.2)

## 2016-08-30 LAB — ETHANOL: ALCOHOL ETHYL (B): 358 mg/dL — AB (ref <5)

## 2016-08-30 LAB — RAPID URINE DRUG SCREEN, HOSP PERFORMED
AMPHETAMINES: NOT DETECTED
BENZODIAZEPINES: POSITIVE — AB
Barbiturates: NOT DETECTED
COCAINE: NOT DETECTED
OPIATES: NOT DETECTED
Tetrahydrocannabinol: POSITIVE — AB

## 2016-08-30 LAB — SALICYLATE LEVEL: Salicylate Lvl: <7 mg/dL (ref 2.8–30.0)

## 2016-08-30 LAB — VALPROIC ACID LEVEL: Valproic Acid Lvl: <10 ug/mL — ABNORMAL LOW (ref 50.0–100.0)

## 2016-08-30 LAB — ACETAMINOPHEN LEVEL: Acetaminophen (Tylenol), Serum: <10 ug/mL — ABNORMAL LOW (ref 10–30)

## 2016-08-30 MED ORDER — LORAZEPAM 1 MG PO TABS
0.0000 mg | ORAL_TABLET | Freq: Four times a day (QID) | ORAL | Status: DC
  Administered 2016-08-31 (×3): 1 mg via ORAL
  Filled 2016-08-30 (×3): qty 1

## 2016-08-30 MED ORDER — LORAZEPAM 1 MG PO TABS
0.0000 mg | ORAL_TABLET | Freq: Two times a day (BID) | ORAL | Status: DC
Start: 2016-09-01 — End: 2016-08-31

## 2016-08-30 MED ORDER — ADULT MULTIVITAMIN W/MINERALS CH
1.0000 | ORAL_TABLET | Freq: Every day | ORAL | Status: DC
  Administered 2016-08-30 – 2016-08-31 (×2): 1 via ORAL
  Filled 2016-08-30 (×2): qty 1

## 2016-08-30 MED ORDER — VITAMIN B-1 100 MG PO TABS
100.0000 mg | ORAL_TABLET | Freq: Every day | ORAL | Status: DC
  Administered 2016-08-30 – 2016-08-31 (×2): 100 mg via ORAL
  Filled 2016-08-30 (×2): qty 1

## 2016-08-30 MED ORDER — NICOTINE 21 MG/24HR TD PT24
21.0000 mg | MEDICATED_PATCH | Freq: Every day | TRANSDERMAL | Status: DC
  Filled 2016-08-30: qty 1

## 2016-08-30 MED ORDER — ONDANSETRON HCL 4 MG PO TABS: 4.0000 mg | ORAL_TABLET | Freq: Three times a day (TID) | ORAL | Status: DC | PRN

## 2016-08-30 MED ORDER — IBUPROFEN 200 MG PO TABS: 600.0000 mg | ORAL_TABLET | Freq: Three times a day (TID) | ORAL | Status: DC | PRN

## 2016-08-30 MED ORDER — THIAMINE HCL 100 MG/ML IJ SOLN: 100.0000 mg | Freq: Every day | INTRAMUSCULAR | Status: DC

## 2016-08-30 MED ORDER — ALUM & MAG HYDROXIDE-SIMETH 200-200-20 MG/5ML PO SUSP: 30.0000 mL | ORAL | Status: DC | PRN

## 2016-08-30 MED ORDER — NALTREXONE HCL 50 MG PO TABS
50.0000 mg | ORAL_TABLET | Freq: Every day | ORAL | Status: DC
  Administered 2016-08-30 – 2016-08-31 (×2): 50 mg via ORAL
  Filled 2016-08-30 (×2): qty 1

## 2016-08-30 MED ORDER — ZOLPIDEM TARTRATE 5 MG PO TABS
5.0000 mg | ORAL_TABLET | Freq: Every evening | ORAL | Status: DC | PRN
  Administered 2016-08-31: 5 mg via ORAL
  Filled 2016-08-30: qty 1

## 2016-08-30 MED ORDER — HYDROXYZINE HCL 25 MG PO TABS
50.0000 mg | ORAL_TABLET | Freq: Four times a day (QID) | ORAL | Status: DC | PRN
  Administered 2016-08-30: 50 mg via ORAL
  Filled 2016-08-30: qty 2

## 2016-08-30 MED ORDER — DIVALPROEX SODIUM ER 500 MG PO TB24
1000.0000 mg | ORAL_TABLET | Freq: Every day | ORAL | Status: DC
  Administered 2016-08-30: 1000 mg via ORAL
  Filled 2016-08-30: qty 2

## 2016-08-30 MED ORDER — ACETAMINOPHEN 325 MG PO TABS: 650.0000 mg | ORAL_TABLET | ORAL | Status: DC | PRN

## 2016-08-30 MED ORDER — TRAZODONE HCL 50 MG PO TABS
50.0000 mg | ORAL_TABLET | Freq: Every day | ORAL | Status: DC
  Administered 2016-08-30: 50 mg via ORAL
  Filled 2016-08-30: qty 1

## 2016-08-30 NOTE — ED Notes (Signed)
Patient refusing labs at this time. 

## 2016-08-30 NOTE — ED Notes (Signed)
Lab called critical alcohol level 358

## 2016-08-30 NOTE — ED Provider Notes (Signed)
[facility]-EMERGENCY DEPT Provider Note   CSN: [id number] Arrival date & time: 08/30/16  1047     History   Chief Complaint No chief complaint on file.   HPI Adam Novak is a 26 y.o. male.  The history is provided by the patient and medical records.   Level V caveat:  Alcohol intoxication 26 year old male with history of bipolar disorder, alcohol abuse, presenting to the ED for homicidal ideation. Patient reports lately he has been having thoughts of hurting other people, no specific plan. He has not actually harm anyone, but is scared he may do so. States deep down he does not feel he wants to harm people, however is having difficulty controlling his thoughts. He denies any suicidal ideation. He denies any hallucinations. Patient has been drinking today, states he took 2 tramadol and drinking 40 ounces beer. He denies any seizure activity or tremors. Reports occasional use of marijuana, no other illicit drug use. States he has prescribed Depakote, trazodone, and naltrexone, reports he has been taking his medications as directed. Patient states he called 911 today because he was scared he may hurt someone and he knows he needs help.  Past Medical History:  Diagnosis Date  . Bipolar 1 disorder [facility])     Patient Active Problem List   Diagnosis Date Noted  . Bipolar affective disorder, depressed, severe (HCC) 08/07/2016  . Bipolar 1 disorder (HCC) 12/16/2015  . Alcohol dependence with alcohol-induced mood disorder (HCC)   . Alcohol use disorder, severe, dependence (HCC) 12/15/2015  . Bipolar I disorder, most recent episode depressed (HCC) 05/15/2015  . Dog bite of left calf 06/19/2014  . Alcohol abuse 06/18/2014  . Alcohol withdrawal (HCC) 06/18/2014  . Alcohol intoxication, episodic (HCC) 06/18/2014  . Encephalopathy 06/18/2014  . Leukocytosis   . Polysubstance abuse   . CLOSED FRACTURE OF NECK OF METACARPAL BONE 06/16/2010    Past Surgical History:  Procedure Laterality  Date  . KNEE SURGERY Right        Home Medications    Prior to Admission medications   Medication Sig Start Date End Date Taking? Authorizing Provider  divalproex (DEPAKOTE ER) 500 MG 24 hr tablet Take 2 tablets (1,000 mg total) by mouth at bedtime. 08/11/16   Oneta Rack, NP  naltrexone (DEPADE) 50 MG tablet Take 1 tablet (50 mg total) by mouth daily. 08/12/16   Oneta Rack, NP  traZODone (DESYREL) 50 MG tablet Take 1 tablet (50 mg total) by mouth at bedtime. 08/11/16   Oneta Rack, NP    Family History History reviewed. No pertinent family history.  Social History Social History  Substance Use Topics  . Smoking status: Current Some Day Smoker    Packs/day: 0.50    Types: Cigarettes  . Smokeless tobacco: Never Used  . Alcohol use Yes     Comment: socially     Allergies   Patient has no known allergies.   Review of Systems Review of Systems  Unable to perform ROS: Other (intoxication)     Physical Exam Updated Vital Signs BP (!) 150/98   Pulse 100   Resp 18   Ht 5\' 9"  (1.753 m)   Wt 95.3 kg   SpO2 98%   BMI 31.01 kg/m   Physical Exam  Constitutional: He is oriented to person, place, and time. He appears well-developed and well-nourished.  Disheveled appearance, smells of alcohol and cigarettes  HENT:  Head: Normocephalic and atraumatic.  Mouth/Throat: Oropharynx is clear and moist.  Eyes: Conjunctivae and EOM are normal. Pupils are equal, round, and reactive to light.  Neck: Normal range of motion.  Cardiovascular: Normal rate, regular rhythm and normal heart sounds.   Pulmonary/Chest: Effort normal and breath sounds normal.  Abdominal: Soft. Bowel sounds are normal.  Musculoskeletal: Normal range of motion.  Neurological: He is alert and oriented to person, place, and time.  Awake, alert, oriented, appears somewhat intoxicated, moving his extremities well  Skin: Skin is warm and dry.  Psychiatric: He has a normal mood and affect. He is not  actively hallucinating. He expresses homicidal ideation. He expresses no suicidal ideation. He expresses no suicidal plans and no homicidal plans.  Admits to HI without specific plan; denies SI/AVH  Nursing note and vitals reviewed.    ED Treatments / Results  Labs (all labs ordered are listed, but only abnormal results are displayed) Labs Reviewed  COMPREHENSIVE METABOLIC PANEL - Abnormal; Notable for the following:       Result Value   Glucose, Bld 123 (*)    Total Protein 8.6 (*)    All other components within normal limits  [name] - Abnormal; Notable for the following:    Alcohol, Ethyl (B) 358 (*)    All other components within normal limits  ACETAMINOPHEN LEVEL - Abnormal; Notable for the following:    Acetaminophen (Tylenol), Serum <10 (*)    All other components within normal limits  VALPROIC ACID LEVEL - Abnormal; Notable for the following:    Valproic Acid Lvl <10 (*)    All other components within normal limits  CBC WITH DIFFERENTIAL/PLATELET  SALICYLATE LEVEL  RAPID URINE DRUG SCREEN, HOSP PERFORMED    EKG  EKG Interpretation None       Radiology No results found.  Procedures Procedures (including critical care time)  Medications Ordered in ED Medications - No data to display   Initial Impression / Assessment and Plan / ED Course  I have reviewed the triage vital signs and the nursing notes.  Pertinent labs & imaging results that were available during my care of the patient were reviewed by me and considered in my medical decision making (see chart for details).  26 year old male here with homicidal ideation. States he began concerned that he would actually harm someone so he called 911 to get some help. He has have history of schizophrenia. Patient appears disheveled and clearly intoxicated on exam. Does admit to taking tramadol and drinking a 40 ounce beer this morning. We'll plan for screening labs and TTS evaluation.  12:36 PM Patient trying to  leave ED at this time.  He has not had any screening lab work performed thus far.  He appears intoxicated currently.  He has voiced homicidal ideation.  He will be IVC'd for psychiatric evaluation.  Screening lab work was obtained-- delay due to patient uncooperativeness.  Overall reassuring-- ethanol elevated at 358.  Patient without signs/symptoms of withdrawal at this time.  He does have long history of alcohol abuse so will place on CIWA protocol.  He will need to sober prior to TTS eval.  Holding orders and home meds are in place.  Final Clinical Impressions(s) / ED Diagnoses   Final diagnoses:  Homicidal ideation  Alcohol abuse    New Prescriptions New Prescriptions   No medications on file     Garlon HatchetLisa M Ishitha Roper, PA-C 08/30/16 1538    Melene Planan Floyd, DO 08/31/16 [phone]

## 2016-08-30 NOTE — ED Notes (Signed)
Bed: [facility] Expected date:  Expected time:  Means of arrival:  Comments: EMS-SI 

## 2016-08-30 NOTE — ED Notes (Addendum)
Gave patient meal tray and he ate 100% of food.  Able to ambulate without difficulty.  Placed urine cup in patient's room and reminded him that we need a urine sample.  Patient urinated before nurse could get in patient's room, hence the delay.  Patient cooperative and calm at this time.

## 2016-08-30 NOTE — ED Notes (Signed)
Lab at bedside

## 2016-08-30 NOTE — Progress Notes (Signed)
08/30/16 1337:  LRT introduced self to pt in dayroom and offered activities, pt declined.  Caroll RancherMarjette Vinal Rosengrant, LRT, CTRS

## 2016-08-30 NOTE — ED Notes (Signed)
Pt presents with SI, ingested 2 tramadol and drank beer.  Pt admits to binge drinking.  Previous hx of SI attempts noted.  Denies HI, AVH or hopeless feelings.  Denies SI at present.  Monitoring for safety, Q 15 min checks in effect.  Safety check for contraband completed, no items found.

## 2016-08-30 NOTE — ED Notes (Signed)
Pt informed nursing staff he will allow his labs to be drawn, this nurse notified lab for blood draw.

## 2016-08-30 NOTE — ED Notes (Signed)
Pt observed in dayroom socializing with peers.
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
## 2016-08-30 NOTE — ED Notes (Signed)
Bed: WA26 Expected date:  Expected time:  Means of arrival:  Comments: 

## 2016-08-30 NOTE — ED Notes (Signed)
Pt admitted to room #40. Pt behavior cooperative, anxious. Denies SI/HI/AVH. [facility] Encouragement and support provided. Special checks q 15 mins in place for safety. Video monitoring in place. Will continue to monitor.

## 2016-08-30 NOTE — ED Notes (Addendum)
Informed by Tarri Glennheressa MHT, that pt reports to her that he slipped when getting out of the shower, this event was unwitnessed. This nurse talked to pt and pt reports he slipped in the bathroom, but that he did not fall, and did not hit the ground, pt reports he grabbed the door handle. Pt denies injury. Pt denies pain. No s/s of acute injury at this time. VS obtained. This nurse notified Linsey, AC. This nurse notified Jorene MinorsJameson, NP- no new orders at this time. Will continue to monitor.

## 2016-08-30 NOTE — ED Triage Notes (Signed)
Patient is having suicidal Ideation. Patient took 2 tramadol and drank a 40. Patient has a history of past suicide attempts. Patient called 911 for himself.

## 2016-08-31 DIAGNOSIS — F17210 Nicotine dependence, cigarettes, uncomplicated: Secondary | ICD-10-CM

## 2016-08-31 DIAGNOSIS — Z79899 Other long term (current) drug therapy: Secondary | ICD-10-CM

## 2016-08-31 DIAGNOSIS — F1290 Cannabis use, unspecified, uncomplicated: Secondary | ICD-10-CM

## 2016-08-31 DIAGNOSIS — F1024 Alcohol dependence with alcohol-induced mood disorder: Secondary | ICD-10-CM

## 2016-08-31 DIAGNOSIS — F1390 Sedative, hypnotic, or anxiolytic use, unspecified, uncomplicated: Secondary | ICD-10-CM | POA: Diagnosis not present

## 2016-08-31 MED ORDER — TRAZODONE HCL 50 MG PO TABS: 50.0000 mg | ORAL_TABLET | Freq: Every day | ORAL | 0 refills | Status: DC

## 2016-08-31 MED ORDER — HYDROXYZINE HCL 50 MG PO TABS: 50.0000 mg | ORAL_TABLET | Freq: Four times a day (QID) | ORAL | 0 refills | Status: DC | PRN

## 2016-08-31 MED ORDER — DIVALPROEX SODIUM ER 500 MG PO TB24: 1000.0000 mg | ORAL_TABLET | Freq: Every day | ORAL | 0 refills | Status: DC

## 2016-08-31 NOTE — BH Assessment (Signed)
[facility] Assessment Progress Note  Per Thedore MinsMojeed Akintayo, MD, this pt does not require psychiatric hospitalization at this time.  Pt presents under IVC which Dr Jannifer FranklinAkintayo has rescinded.  [facility] information for [facility].  This has been included in pt's discharge instructions.  [name] nurse, Kendal Hymendie, has been notified.  Adam Canninghomas Makylah Bossard, MA Triage Specialist [phone]

## 2016-08-31 NOTE — ED Notes (Signed)
BP 135/75.  Pt denies S/I.  Denies withdrawal signs and symptoms.  Discharge instructions and prescriptions were reviewed.  Bus pass was given and belongings were returned.

## 2016-08-31 NOTE — [facility] Suicide Risk Assessment (Signed)
Suicide Risk Assessment  Discharge Assessment   [facility]   Principal Problem: Alcohol dependence with alcohol-induced mood disorder [facility](HCC) Discharge Diagnoses:  Patient Active Problem List   Diagnosis Date Noted  . Alcohol dependence with alcohol-induced mood disorder (HCC) [F10.24]     Priority: High  . Bipolar I disorder, most recent episode depressed (HCC) [F31.30] 05/15/2015    Priority: High  . Polysubstance abuse [F19.10]     Priority: High  . Bipolar affective disorder, depressed, severe (HCC) [F31.4] 08/07/2016  . Bipolar 1 disorder (HCC) [F31.9] 12/16/2015  . Alcohol use disorder, severe, dependence (HCC) [F10.20] 12/15/2015  . Dog bite of left calf [S81.852A, W54.0XXA] 06/19/2014  . Alcohol abuse [F10.10] 06/18/2014  . Alcohol withdrawal (HCC) [F10.239] 06/18/2014  . Alcohol intoxication, episodic (HCC) [F10.129] 06/18/2014  . Encephalopathy [G93.40] 06/18/2014  . Leukocytosis [D72.829]   . CLOSED FRACTURE OF NECK OF METACARPAL BONE [S62.339A] 06/16/2010    Total Time spent with patient: 45 minutes  Musculoskeletal: Strength & Muscle Tone: within normal limits Gait & Station: normal Patient leans: N/A  Psychiatric Specialty Exam: Physical Exam  Constitutional: He is oriented to person, place, and time. He appears well-developed and well-nourished.  HENT:  Head: Normocephalic.  Neck: Normal range of motion.  Respiratory: Effort normal.  Musculoskeletal: Normal range of motion.  Neurological: He is alert and oriented to person, place, and time.  Psychiatric: He has a normal mood and affect. His speech is normal and behavior is normal. Judgment and thought content normal. Cognition and memory are normal.    Review of Systems  Psychiatric/Behavioral: Positive for substance abuse.  All other systems reviewed and are negative.   Blood pressure (!) 151/87, pulse 97, temperature 97.7 F (36.5 C), temperature source Oral, resp. rate 17,  height 5\' 9"  (1.753 m), weight 95.3 kg (210 lb), SpO2 97 %.Body mass index is 31.01 kg/m.  General Appearance: Casual  Eye Contact:  Good  Speech:  Normal Rate  Volume:  Normal  Mood:  Euthymic  Affect:  Congruent  Thought Process:  Coherent and Descriptions of Associations: Intact  Orientation:  Full (Time, Place, and Person)  Thought Content:  WDL and Logical  Suicidal Thoughts:  No  Homicidal Thoughts:  No  Memory:  Immediate;   Good Recent;   Good Remote;   Good  Judgement:  Fair  Insight:  Fair  Psychomotor Activity:  Normal  Concentration:  Concentration: Good and Attention Span: Good  Recall:  Good  Fund of Knowledge:  Fair  Language:  Good  Akathisia:  No  Handed:  Right  AIMS (if indicated):     Assets:  Housing Leisure Time Physical Health Resilience Social Support  ADL's:  Intact  Cognition:  WNL  Sleep:      Mental Status Per Nursing Assessment::   On Admission:   alcohol abuse  Demographic Factors:  Male  Loss Factors: NA  Historical Factors: NA  Risk Reduction Factors:   Sense of responsibility to family, Living with another person, especially a relative and Positive social support  Continued Clinical Symptoms:  None   Cognitive Features That Contribute To Risk:  None    Suicide Risk:  Minimal: No identifiable suicidal ideation.  Patients presenting with no risk factors but with morbid ruminations; may be classified as minimal risk based on the severity of the depressive symptoms    Plan Of Care/Follow-up recommendations:  Activity:  as tolerated Diet:  heart healthy diet  Nanine MeansLORD, JAMISON, NP 08/31/2016,  10:16 AM

## 2016-08-31 NOTE — Consult Note (Signed)
[place] [facility] Consult   Reason for Consult:  Alcohol abuse  Referring Physician:  EDP Patient Identification: Adam Novak MRN:  [id number] Principal Diagnosis: Alcohol dependence with alcohol-induced mood disorder [facility]) Diagnosis:   Patient Active Problem List   Diagnosis Date Noted  . Alcohol dependence with alcohol-induced mood disorder ([place]) [F10.24]     Priority: High  . Bipolar I disorder, most recent episode depressed ([place]) [F31.30] 05/15/2015    Priority: High  . Polysubstance abuse [F19.10]     Priority: High  . Bipolar affective disorder, depressed, severe ([place]) [F31.4] 08/07/2016  . Bipolar 1 disorder ([place]) [F31.9] 12/16/2015  . Alcohol use disorder, severe, dependence ([place]) [F10.20] 12/15/2015  . Dog bite of left calf [S81.852A, W54.0XXA] 06/19/2014  . Alcohol abuse [F10.10] 06/18/2014  . Alcohol withdrawal ([place]) [F10.239] 06/18/2014  . Alcohol intoxication, episodic ([place]) [F10.129] 06/18/2014  . Encephalopathy [G93.40] 06/18/2014  . Leukocytosis [D72.829]   . CLOSED FRACTURE OF NECK OF METACARPAL BONE [S62.339A] 06/16/2010    Total Time spent with patient: 45 minutes  Subjective:   Adam Novak is a 26 y.o. male patient does not warrant admission.  HPI:  26 yo male who came to the ED under the influence of alcohol requesting assistance for substance abuse.  Today, he is clear and coherent, reviewing AA meeting material.  Denies suicidal/homicidal ideations, hallucinations, and withdrawal symptoms.  Interested in resources outpatient, provided.  Stable for discharge.  Past Psychiatric History: substance abuse  Risk to Self: Suicidal Ideation: No Suicidal Intent: No Is patient at risk for suicide?: No Suicidal Plan?: No Access to Means: No What has been your use of drugs/alcohol within the last 12 months?: see above Intentional Self Injurious Behavior: None Risk to Others: Homicidal Ideation: No Thoughts of Harm to Others: No Current  Homicidal Intent: No Current Homicidal Plan: No Access to Homicidal Means: No History of harm to others?: No Assessment of Violence: None Noted Does patient have access to weapons?: No Criminal Charges Pending?: Yes Describe Pending Criminal Charges: possess marijuana; DWI; communicating threats; resisting public officer Does patient have a court date: Yes Court Date: 10/02/16 Prior Inpatient Therapy: Prior Inpatient Therapy: Yes Prior Therapy Dates: 2-08/2016 Prior Therapy Facilty/Provider(s): [facility] Reason for Treatment: depression and alcohol abuse Prior Outpatient Therapy: Prior Outpatient Therapy: No Does patient have an ACCT team?: No Does patient have Intensive In-House Services?  : No Does patient have [facility] services? : No Does patient have P4CC services?: No  Past Medical History:  Past Medical History:  Diagnosis Date  . Bipolar 1 disorder [facility])     Past Surgical History:  Procedure Laterality Date  . KNEE SURGERY Right    Family History: History reviewed. No pertinent family history. Family Psychiatric  History: none Social History:  History  Alcohol Use  . Yes    Comment: socially     History  Drug Use  . Types: Marijuana, Benzodiazepines    Social History   Social History  . Marital status: Single    Spouse name: N/A  . Number of children: N/A  . Years of education: N/A   Social History Main Topics  . Smoking status: Current Some Day Smoker    Packs/day: 0.50    Types: Cigarettes  . Smokeless tobacco: Never Used  . Alcohol use Yes     Comment: socially  . Drug use: Yes    Types: Marijuana, Benzodiazepines  . Sexual activity: No   Other Topics Concern  . None  Social History Narrative  . None   Additional Social History:    Allergies:  No Known Allergies  Labs:  Results for orders placed or performed during the hospital encounter of 08/30/16 (from the past 48 hour(s))  CBC with Differential     Status: None   Collection Time:  08/30/16  1:46 PM  Result Value Ref Range   WBC 7.6 4.0 - 10.5 K/uL   RBC 4.82 4.22 - 5.81 MIL/uL   Hemoglobin 16.0 13.0 - 17.0 g/dL   HCT 46.4 39.0 - 52.0 %   MCV 96.3 78.0 - 100.0 fL   MCH 33.2 26.0 - 34.0 pg   MCHC 34.5 30.0 - 36.0 g/dL   RDW 13.1 11.5 - 15.5 %   Platelets 320 150 - 400 K/uL   Neutrophils Relative % 73 %   Neutro Abs 5.6 1.7 - 7.7 K/uL   Lymphocytes Relative 22 %   Lymphs Abs 1.7 0.7 - 4.0 K/uL   Monocytes Relative 3 %   Monocytes Absolute 0.3 0.1 - 1.0 K/uL   Eosinophils Relative 1 %   Eosinophils Absolute 0.1 0.0 - 0.7 K/uL   Basophils Relative 1 %   Basophils Absolute 0.0 0.0 - 0.1 K/uL  Comprehensive metabolic panel     Status: Abnormal   Collection Time: 08/30/16  1:46 PM  Result Value Ref Range   Sodium 142 135 - 145 mmol/L   Potassium 3.7 3.5 - 5.1 mmol/L   Chloride 105 101 - 111 mmol/L   CO2 27 22 - 32 mmol/L   Glucose, Bld 123 (H) 65 - 99 mg/dL   BUN 9 6 - 20 mg/dL   Creatinine, Ser 1.02 0.61 - 1.24 mg/dL   Calcium 9.0 8.9 - 10.3 mg/dL   Total Protein 8.6 (H) 6.5 - 8.1 g/dL   Albumin 5.0 3.5 - 5.0 g/dL   AST 36 15 - 41 U/L   ALT 36 17 - 63 U/L   Alkaline Phosphatase 84 38 - 126 U/L   Total Bilirubin 0.6 0.3 - 1.2 mg/dL   GFR calc non Af Amer >60 >60 mL/min   GFR calc Af Amer >60 >60 mL/min    Comment: (NOTE) The eGFR has been calculated using the CKD EPI equation. This calculation has not been validated in all clinical situations. eGFR's persistently <60 mL/min signify possible Chronic Kidney Disease.    Anion gap 10 5 - 15  [name]     Status: Abnormal   Collection Time: 08/30/16  1:46 PM  Result Value Ref Range   Alcohol, Ethyl (B) 358 (HH) <5 mg/dL    Comment:        LOWEST DETECTABLE LIMIT FOR SERUM ALCOHOL IS 5 mg/dL FOR MEDICAL PURPOSES ONLY CRITICAL RESULT CALLED TO, READ BACK BY AND VERIFIED WITH: E.MARVIN RN AT 1438 ON 08/30/16 BY S.VANHOORNE   Salicylate level     Status: None   Collection Time: 08/30/16  1:46 PM   Result Value Ref Range   Salicylate Lvl <9.7 2.8 - 30.0 mg/dL  Acetaminophen level     Status: Abnormal   Collection Time: 08/30/16  1:46 PM  Result Value Ref Range   Acetaminophen (Tylenol), Serum <10 (L) 10 - 30 ug/mL    Comment:        THERAPEUTIC CONCENTRATIONS VARY SIGNIFICANTLY. A RANGE OF 10-30 ug/mL MAY BE AN EFFECTIVE CONCENTRATION FOR MANY PATIENTS. HOWEVER, SOME ARE BEST TREATED AT CONCENTRATIONS OUTSIDE THIS RANGE. ACETAMINOPHEN CONCENTRATIONS >150 ug/mL AT 4 HOURS AFTER INGESTION  AND >50 ug/mL AT 12 HOURS AFTER INGESTION ARE OFTEN ASSOCIATED WITH TOXIC REACTIONS.   Valproic acid level     Status: Abnormal   Collection Time: 08/30/16  1:46 PM  Result Value Ref Range   Valproic Acid Lvl <10 (L) 50.0 - 100.0 ug/mL    Comment: RESULTS CONFIRMED BY MANUAL DILUTION  Rapid urine drug screen (hospital performed)     Status: Abnormal   Collection Time: 08/30/16  7:30 PM  Result Value Ref Range   Opiates NONE DETECTED NONE DETECTED   Cocaine NONE DETECTED NONE DETECTED   Benzodiazepines POSITIVE (A) NONE DETECTED   Amphetamines NONE DETECTED NONE DETECTED   Tetrahydrocannabinol POSITIVE (A) NONE DETECTED   Barbiturates NONE DETECTED NONE DETECTED    Comment:        DRUG SCREEN FOR MEDICAL PURPOSES ONLY.  IF CONFIRMATION IS NEEDED FOR ANY PURPOSE, NOTIFY LAB WITHIN 5 DAYS.        LOWEST DETECTABLE LIMITS FOR URINE DRUG SCREEN Drug Class       Cutoff (ng/mL) Amphetamine      1000 Barbiturate      200 Benzodiazepine   193 Tricyclics       790 Opiates          300 Cocaine          300 THC              50     Current Facility-Administered Medications  Medication Dose Route Frequency Provider Last Rate Last Dose  . acetaminophen (TYLENOL) tablet 650 mg  650 mg Oral Q4H PRN Larene Pickett, PA-C      . alum & mag hydroxide-simeth (MAALOX/MYLANTA) 200-200-20 MG/5ML suspension 30 mL  30 mL Oral PRN Larene Pickett, PA-C      . divalproex (DEPAKOTE ER) 24 hr tablet  1,000 mg  1,000 mg Oral QHS Larene Pickett, PA-C   1,000 mg at 08/30/16 2151  . hydrOXYzine (ATARAX/VISTARIL) tablet 50 mg  50 mg Oral Q6H PRN [name], NP   50 mg at 08/30/16 1424  . ibuprofen (ADVIL,MOTRIN) tablet 600 mg  600 mg Oral Q8H PRN Larene Pickett, PA-C      . LORazepam (ATIVAN) tablet 0-4 mg  0-4 mg Oral Q6H Larene Pickett, PA-C   1 mg at 08/31/16 0900   Followed by  . [START ON 09/01/2016] LORazepam (ATIVAN) tablet 0-4 mg  0-4 mg Oral Q12H Larene Pickett, PA-C      . multivitamin with minerals tablet 1 tablet  1 tablet Oral Daily Larene Pickett, PA-C   1 tablet at 08/31/16 2409  . naltrexone (DEPADE) tablet 50 mg  50 mg Oral Daily Larene Pickett, PA-C   50 mg at 08/31/16 7353  . nicotine (NICODERM CQ - dosed in mg/24 hours) patch 21 mg  21 mg Transdermal Daily Larene Pickett, PA-C      . ondansetron [facility]) tablet 4 mg  4 mg Oral Q8H PRN Larene Pickett, PA-C      . thiamine (VITAMIN B-1) tablet 100 mg  100 mg Oral Daily Larene Pickett, PA-C   100 mg at 08/31/16 2992  . traZODone (DESYREL) tablet 50 mg  50 mg Oral QHS Larene Pickett, PA-C   50 mg at 08/30/16 2151   Current Outpatient Prescriptions  Medication Sig Dispense Refill  . divalproex (DEPAKOTE ER) 500 MG 24 hr tablet Take 2 tablets (1,000 mg total) by mouth at bedtime. [place]  tablet 0  . Multiple Vitamin (MULTIVITAMIN WITH MINERALS) TABS tablet Take 1 tablet by mouth daily.    . naltrexone (DEPADE) 50 MG tablet Take 1 tablet (50 mg total) by mouth daily. 30 tablet 0  . traZODone (DESYREL) 50 MG tablet Take 1 tablet (50 mg total) by mouth at bedtime. 30 tablet 0    Musculoskeletal: Strength & Muscle Tone: within normal limits Gait & Station: normal Patient leans: N/A  Psychiatric Specialty Exam: Physical Exam  Constitutional: He is oriented to person, place, and time. He appears well-developed and well-nourished.  HENT:  Head: Normocephalic.  Neck: Normal range of motion.  Respiratory: Effort normal.   Musculoskeletal: Normal range of motion.  Neurological: He is alert and oriented to person, place, and time.  Psychiatric: He has a normal mood and affect. His speech is normal and behavior is normal. Judgment and thought content normal. Cognition and memory are normal.    Review of Systems  Psychiatric/Behavioral: Positive for substance abuse.  All other systems reviewed and are negative.   Blood pressure (!) 151/87, pulse 97, temperature 97.7 F (36.5 C), temperature source Oral, resp. rate 17, height _0  (1.753 m), weight 95.3 kg (210 lb), SpO2 97 %.Body mass index is 31.01 kg/m.  General Appearance: Casual  Eye Contact:  Good  Speech:  Normal Rate  Volume:  Normal  Mood:  Euthymic  Affect:  Congruent  Thought Process:  Coherent and Descriptions of Associations: Intact  Orientation:  Full (Time, Place, and Person)  Thought Content:  WDL and Logical  Suicidal Thoughts:  No  Homicidal Thoughts:  No  Memory:  Immediate;   Good Recent;   Good Remote;   Good  Judgement:  Fair  Insight:  Fair  Psychomotor Activity:  Normal  Concentration:  Concentration: Good and Attention Span: Good  Recall:  Good  Fund of Knowledge:  Fair  Language:  Good  Akathisia:  No  Handed:  Right  AIMS (if indicated):     Assets:  Housing Leisure Time Physical Health Resilience Social Support  ADL's:  Intact  Cognition:  WNL  Sleep:        Treatment Plan Summary: Daily contact with patient to assess and evaluate symptoms and progress in treatment, Medication management and Plan alcohol abuse with alcohol induced mood disorder:  -Crisis stabilization -Medication management:  Ativan alcohol detox protocol started along with Depakote 1000 mg at bedtime for mood stabilization and Trazodone 50 mg at bedtime for sleep -Individual and substance abuse counseling  Disposition: No evidence of imminent risk to self or others at present.    Waylan Boga, NP 08/31/2016 10:10 AM  Patient seen  face-to-face for psychiatric evaluation, chart reviewed and case discussed with the physician extender and developed treatment plan. Reviewed the information documented and agree with the treatment plan. Corena Pilgrim, MD

## 2016-08-31 NOTE — Discharge Instructions (Signed)
To help you maintain a sober lifestyle, a substance abuse treatment program may be beneficial to you.  Contact [facility] at your earliest opportunity to ask about enrolling in their program: ° °     [facility] °     [address], [address] °     , [place] [address] °     [phone] °     New patients are seen at the walk-in clinic every Tuesday from 9:00 am - 12:00 pm. °

## 2016-12-20 ENCOUNTER — Encounter (HOSPITAL_COMMUNITY): Payer: Self-pay | Admitting: Emergency Medicine

## 2016-12-20 ENCOUNTER — Emergency Department (HOSPITAL_COMMUNITY)
Admission: EM | Admit: 2016-12-20 | Discharge: 2016-12-22 | Disposition: A | Discharge status: Home / Self Care | Payer: Federal, State, Local not specified - Other | Attending: Emergency Medicine | Admitting: Emergency Medicine

## 2016-12-20 DIAGNOSIS — F1920 Other psychoactive substance dependence, uncomplicated: Secondary | ICD-10-CM

## 2016-12-20 DIAGNOSIS — F1024 Alcohol dependence with alcohol-induced mood disorder: Secondary | ICD-10-CM | POA: Diagnosis present

## 2016-12-20 DIAGNOSIS — Z79899 Other long term (current) drug therapy: Secondary | ICD-10-CM | POA: Insufficient documentation

## 2016-12-20 DIAGNOSIS — R7401 Elevation of levels of liver transaminase levels: Secondary | ICD-10-CM

## 2016-12-20 DIAGNOSIS — F3130 Bipolar disorder, current episode depressed, mild or moderate severity, unspecified: Secondary | ICD-10-CM | POA: Diagnosis present

## 2016-12-20 DIAGNOSIS — F1994 Other psychoactive substance use, unspecified with psychoactive substance-induced mood disorder: Secondary | ICD-10-CM

## 2016-12-20 DIAGNOSIS — F10920 Alcohol use, unspecified with intoxication, uncomplicated: Secondary | ICD-10-CM

## 2016-12-20 DIAGNOSIS — R45850 Homicidal ideations: Secondary | ICD-10-CM | POA: Insufficient documentation

## 2016-12-20 DIAGNOSIS — R451 Restlessness and agitation: Secondary | ICD-10-CM | POA: Insufficient documentation

## 2016-12-20 DIAGNOSIS — Y907 Blood alcohol level of 200-239 mg/100 ml: Secondary | ICD-10-CM | POA: Insufficient documentation

## 2016-12-20 DIAGNOSIS — F17210 Nicotine dependence, cigarettes, uncomplicated: Secondary | ICD-10-CM | POA: Insufficient documentation

## 2016-12-20 DIAGNOSIS — F1910 Other psychoactive substance abuse, uncomplicated: Secondary | ICD-10-CM | POA: Diagnosis present

## 2016-12-20 DIAGNOSIS — R740 Nonspecific elevation of levels of transaminase and lactic acid dehydrogenase [LDH]: Secondary | ICD-10-CM | POA: Insufficient documentation

## 2016-12-20 NOTE — ED Notes (Signed)
Writer states the patient refuses blood draw at this time. Triage RN aware.
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
## 2016-12-21 DIAGNOSIS — F1994 Other psychoactive substance use, unspecified with psychoactive substance-induced mood disorder: Secondary | ICD-10-CM

## 2016-12-21 DIAGNOSIS — F1920 Other psychoactive substance dependence, uncomplicated: Secondary | ICD-10-CM

## 2016-12-21 LAB — ETHANOL: Alcohol, Ethyl (B): 239 mg/dL — ABNORMAL HIGH (ref <5)

## 2016-12-21 LAB — COMPREHENSIVE METABOLIC PANEL
ALK PHOS: 65 U/L (ref 38–126)
ALT: 62 U/L (ref 17–63)
AST: 71 U/L — ABNORMAL HIGH (ref 15–41)
Albumin: 4.7 g/dL (ref 3.5–5.0)
Anion gap: 10 (ref 5–15)
BUN: 5 mg/dL — AB (ref 6–20)
CALCIUM: 9.4 mg/dL (ref 8.9–10.3)
CO2: 24 mmol/L (ref 22–32)
CREATININE: 0.89 mg/dL (ref 0.61–1.24)
Chloride: 106 mmol/L (ref 101–111)
Glucose, Bld: 125 mg/dL — ABNORMAL HIGH (ref 65–99)
Potassium: 4 mmol/L (ref 3.5–5.1)
Sodium: 140 mmol/L (ref 135–145)
Total Bilirubin: 0.7 mg/dL (ref 0.3–1.2)
Total Protein: 8.2 g/dL — ABNORMAL HIGH (ref 6.5–8.1)

## 2016-12-21 LAB — CBC
HCT: 46.1 % (ref 39.0–52.0)
HEMOGLOBIN: 16.6 g/dL (ref 13.0–17.0)
MCH: 34.5 pg — AB (ref 26.0–34.0)
MCHC: 36 g/dL (ref 30.0–36.0)
MCV: 95.8 fL (ref 78.0–100.0)
PLATELETS: 322 10*3/uL (ref 150–400)
RBC: 4.81 MIL/uL (ref 4.22–5.81)
RDW: 12.9 % (ref 11.5–15.5)
WBC: 9.1 10*3/uL (ref 4.0–10.5)

## 2016-12-21 LAB — ACETAMINOPHEN LEVEL: Acetaminophen (Tylenol), Serum: <10 ug/mL — ABNORMAL LOW (ref 10–30)

## 2016-12-21 LAB — SALICYLATE LEVEL

## 2016-12-21 MED ORDER — ADULT MULTIVITAMIN W/MINERALS CH
1.0000 | ORAL_TABLET | Freq: Every day | ORAL | Status: DC
  Administered 2016-12-21 – 2016-12-22 (×2): 1 via ORAL
  Filled 2016-12-21 (×2): qty 1

## 2016-12-21 MED ORDER — NALTREXONE HCL 50 MG PO TABS
50.0000 mg | ORAL_TABLET | Freq: Every day | ORAL | Status: DC
  Administered 2016-12-21 – 2016-12-22 (×2): 50 mg via ORAL
  Filled 2016-12-21 (×2): qty 1

## 2016-12-21 MED ORDER — IBUPROFEN 200 MG PO TABS
600.0000 mg | ORAL_TABLET | Freq: Three times a day (TID) | ORAL | Status: DC | PRN
  Administered 2016-12-21: 600 mg via ORAL
  Filled 2016-12-21: qty 3

## 2016-12-21 MED ORDER — ZIPRASIDONE MESYLATE 20 MG IM SOLR
20.0000 mg | Freq: Once | INTRAMUSCULAR | Status: AC
  Administered 2016-12-21: 20 mg via INTRAMUSCULAR
  Filled 2016-12-21: qty 20

## 2016-12-21 MED ORDER — ZOLPIDEM TARTRATE 5 MG PO TABS: 5.0000 mg | ORAL_TABLET | Freq: Every evening | ORAL | Status: DC | PRN

## 2016-12-21 MED ORDER — ALUM & MAG HYDROXIDE-SIMETH 200-200-20 MG/5ML PO SUSP: 30.0000 mL | Freq: Four times a day (QID) | ORAL | Status: DC | PRN

## 2016-12-21 MED ORDER — DIVALPROEX SODIUM ER 500 MG PO TB24: 1000.0000 mg | ORAL_TABLET | Freq: Every day | ORAL | Status: DC

## 2016-12-21 MED ORDER — HYDROXYZINE HCL 25 MG PO TABS: 50.0000 mg | ORAL_TABLET | Freq: Four times a day (QID) | ORAL | Status: DC | PRN

## 2016-12-21 MED ORDER — NICOTINE 14 MG/24HR TD PT24
14.0000 mg | MEDICATED_PATCH | Freq: Every day | TRANSDERMAL | Status: DC
  Administered 2016-12-21: 14 mg via TRANSDERMAL
  Filled 2016-12-21 (×2): qty 1

## 2016-12-21 MED ORDER — TRAZODONE HCL 50 MG PO TABS: 50.0000 mg | ORAL_TABLET | Freq: Every day | ORAL | Status: DC

## 2016-12-21 MED ORDER — DIVALPROEX SODIUM ER 500 MG PO TB24
500.0000 mg | ORAL_TABLET | Freq: Two times a day (BID) | ORAL | Status: DC
  Administered 2016-12-21 – 2016-12-22 (×3): 500 mg via ORAL
  Filled 2016-12-21 (×3): qty 1

## 2016-12-21 MED ORDER — TRAZODONE HCL 100 MG PO TABS
100.0000 mg | ORAL_TABLET | Freq: Every day | ORAL | Status: DC
  Administered 2016-12-21: 100 mg via ORAL
  Filled 2016-12-21: qty 1

## 2016-12-21 MED ORDER — HALOPERIDOL LACTATE 5 MG/ML IJ SOLN
5.0000 mg | Freq: Once | INTRAMUSCULAR | Status: AC
  Administered 2016-12-21: 5 mg via INTRAVENOUS
  Filled 2016-12-21: qty 1

## 2016-12-21 NOTE — Progress Notes (Signed)
12/21/16 1326:  LRT was informed by nurse to let pt get some rest.  Caroll RancherMarjette Cherrelle Plante, LRT/CTRS

## 2016-12-21 NOTE — ED Notes (Signed)
Introduced self to patient. Pt oriented to unit expectations.  Assessed pt for:  A) Anxiety &/or agitation: On admission to the [facility] pt is calm and cooperative, but feeling "hungover". He went to bed to sleep. He was not hungry, but accepted Gatorade. He said that he is ready to go home and admits that his problem last night was related to alcohol. He said that it is not his first time. Denies SI/HI/AVH.  S) Safety: Safety maintained with q-15-minute checks and hourly rounds by staff.  A) ADLs: Pt able to perform ADLs independently.  P) Pick-Up (room cleanliness): [name] room clean and free of clutter.

## 2016-12-21 NOTE — ED Notes (Signed)
Pt cursing and threatening staff and [employer], fighting against cuffs and showing aggressive behavior.

## 2016-12-21 NOTE — ED Notes (Signed)
Pt NOT in restraints with shift report given.
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
## 2016-12-21 NOTE — Progress Notes (Addendum)
Pt presents with an irritable mood and affect. Pt blames parents for admission, states "I was just drinking in my room, my father was the one who was yelling at me." Pt denies any SI, HI and AVH. Verbally agrees to contact staff before acting on any harmful thoughts. Speaks with father on the phone tonight, when father hangs up on patient, patients becomes upset and physically aggresive towards inanimate objects. Redirectable, will continue to monitor.

## 2016-12-21 NOTE — ED Notes (Signed)
Pt changed in purple scrubs and wanded by security. Pt cooperative and calm. [facility].

## 2016-12-21 NOTE — Progress Notes (Signed)
  Pt provided with medications per providers orders. [name] labs and vitals were monitored throughout the night. Pt given a 1:1 about emotional and mental status. Pt supported and encouraged to express concerns and questions. Pt educated on medications. Adheres to current medication regimen, will continue to monitor.
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
## 2016-12-22 DIAGNOSIS — F3130 Bipolar disorder, current episode depressed, mild or moderate severity, unspecified: Secondary | ICD-10-CM | POA: Diagnosis not present

## 2016-12-22 DIAGNOSIS — F1390 Sedative, hypnotic, or anxiolytic use, unspecified, uncomplicated: Secondary | ICD-10-CM

## 2016-12-22 DIAGNOSIS — F1024 Alcohol dependence with alcohol-induced mood disorder: Secondary | ICD-10-CM

## 2016-12-22 DIAGNOSIS — F17210 Nicotine dependence, cigarettes, uncomplicated: Secondary | ICD-10-CM | POA: Diagnosis not present

## 2016-12-22 DIAGNOSIS — F1994 Other psychoactive substance use, unspecified with psychoactive substance-induced mood disorder: Secondary | ICD-10-CM

## 2016-12-22 DIAGNOSIS — F1290 Cannabis use, unspecified, uncomplicated: Secondary | ICD-10-CM

## 2016-12-22 DIAGNOSIS — F419 Anxiety disorder, unspecified: Secondary | ICD-10-CM

## 2016-12-22 LAB — RAPID URINE DRUG SCREEN, HOSP PERFORMED
Amphetamines: NOT DETECTED
BARBITURATES: NOT DETECTED
Benzodiazepines: POSITIVE — AB
Cocaine: NOT DETECTED
Opiates: NOT DETECTED
Tetrahydrocannabinol: POSITIVE — AB

## 2016-12-22 MED ORDER — NALTREXONE HCL 50 MG PO TABS: 50.0000 mg | ORAL_TABLET | Freq: Every day | ORAL | 0 refills | Status: DC

## 2016-12-22 MED ORDER — GABAPENTIN 300 MG PO CAPS
300.0000 mg | ORAL_CAPSULE | Freq: Three times a day (TID) | ORAL | Status: DC
  Administered 2016-12-22: 300 mg via ORAL
  Filled 2016-12-22: qty 1

## 2016-12-22 MED ORDER — HYDROXYZINE HCL 50 MG PO TABS: 50.0000 mg | ORAL_TABLET | Freq: Four times a day (QID) | ORAL | 0 refills | Status: DC | PRN

## 2016-12-22 MED ORDER — GABAPENTIN 300 MG PO CAPS: 300.0000 mg | ORAL_CAPSULE | Freq: Three times a day (TID) | ORAL | 0 refills | Status: DC

## 2016-12-22 MED ORDER — DIVALPROEX SODIUM ER 500 MG PO TB24: 1000.0000 mg | ORAL_TABLET | Freq: Every day | ORAL | 0 refills | Status: DC

## 2016-12-22 MED ORDER — TRAZODONE HCL 100 MG PO TABS: 100.0000 mg | ORAL_TABLET | Freq: Every day | ORAL | 0 refills | Status: DC

## 2016-12-22 NOTE — ED Notes (Addendum)
Written dc instructions and prescriptions reviewed with pt.  Pt encouraged to take medications as directed and follow up with OP resources.  PT also encouraged to consider attending AA meetings.  Pt denies si/hi/avh at this time, and verbaliized understanding of instructions.  Pt ambulatory w/o difficulty to dc area, belongings returned after leaving the area.

## 2016-12-22 NOTE — ED Notes (Signed)
On the phone 

## 2016-12-22 NOTE — ED Notes (Signed)
Up to the bathroom 

## 2016-12-22 NOTE — Progress Notes (Signed)
Pt awakens to use restroom. Pt approaches Clinical research associatewriter, ruminates about argument with father prior to arrival. Pt states "I am not suicidal, [facility]. My dad has been drinking a lot lately, mainly since my sister died 2 years ago. I think he takes it out on me." Urine sample obtained from pt, see Orders. [facility]. Awaiting results, will pass on to oncoming RN.

## 2016-12-22 NOTE — ED Notes (Signed)
IVC has been rescinded 

## 2016-12-22 NOTE — [facility] Suicide Risk Assessment (Signed)
Suicide Risk Assessment  Discharge Assessment   [facility] Discharge Suicide Risk Assessment   Principal Problem: Alcohol dependence with alcohol-induced mood disorder [facility]) Discharge Diagnoses:  Patient Active Problem List   Diagnosis Date Noted  . Alcohol dependence with alcohol-induced mood disorder (HCC) [F10.24]     Priority: High  . Bipolar I disorder, most recent episode depressed (HCC) [F31.30] 05/15/2015    Priority: High  . Polysubstance abuse [F19.10]     Priority: High  . Substance induced mood disorder (HCC) [F19.94] 12/21/2016  . Polysubstance dependence (HCC) [F19.20] 12/21/2016  . Bipolar affective disorder, depressed, severe (HCC) [F31.4] 08/07/2016  . Bipolar 1 disorder (HCC) [F31.9] 12/16/2015  . Alcohol use disorder, severe, dependence (HCC) [F10.20] 12/15/2015  . Dog bite of left calf [S81.852A, W54.0XXA] 06/19/2014  . Alcohol abuse [F10.10] 06/18/2014  . Alcohol withdrawal (HCC) [F10.239] 06/18/2014  . Alcohol intoxication, episodic (HCC) [F10.129] 06/18/2014  . Encephalopathy [G93.40] 06/18/2014  . Leukocytosis [D72.829]   . CLOSED FRACTURE OF NECK OF METACARPAL BONE [S62.339A] 06/16/2010    Total Time spent with patient: 45 minutes   Musculoskeletal: Strength & Muscle Tone: within normal limits Gait & Station: normal Patient leans: N/A  Psychiatric Specialty Exam: Physical Exam  Constitutional: He is oriented to person, place, and time. He appears well-developed and well-nourished.  HENT:  Head: Normocephalic.  Neck: Normal range of motion.  Respiratory: Effort normal.  Musculoskeletal: Normal range of motion.  Neurological: He is alert and oriented to person, place, and time.  Psychiatric: He has a normal mood and affect. His speech is normal and behavior is normal. Judgment and thought content normal. Cognition and memory are normal.    Review of Systems  Psychiatric/Behavioral: Positive for substance abuse.  All other systems reviewed and are  negative.   Blood pressure 129/90, pulse 98, temperature 98.6 F (37 C), temperature source Oral, resp. rate 18, height 5\' 8"  (1.727 m), weight 95.3 kg (210 lb), SpO2 98 %.Body mass index is 31.93 kg/m.  General Appearance: Disheveled  Eye Contact:  Good  Speech:  Normal Rate  Volume:  Normal  Mood:  Euthymic  Affect:  Congruent  Thought Process:  Coherent and Descriptions of Associations: Intact  Orientation:  Full (Time, Place, and Person)  Thought Content:  WDL and Logical  Suicidal Thoughts:  No  Homicidal Thoughts:  No  Memory:  Immediate;   Good Recent;   Fair Remote;   Good  Judgement:  Fair  Insight:  Fair  Psychomotor Activity:  Normal  Concentration:  Concentration: Good and Attention Span: Good  Recall:  Good  Fund of Knowledge:  Fair  Language:  Good  Akathisia:  No  Handed:  Right  AIMS (if indicated):     Assets:  Housing Leisure Time Physical Health Resilience Vocational/Educational  ADL's:  Intact  Cognition:  WNL  Sleep:      Mental Status Per Nursing Assessment::   On Admission:   alcohol abuse with aggression  Demographic Factors:  Male and Adolescent or young adult  Loss Factors: NA  Historical Factors: NA  Risk Reduction Factors:   Sense of responsibility to family, Living with another person, especially a relative and Positive social support  Continued Clinical Symptoms:  None  Cognitive Features That Contribute To Risk:  None    Suicide Risk:  Minimal: No identifiable suicidal ideation.  Patients presenting with no risk factors but with morbid ruminations; may be classified as minimal risk based on the severity of the depressive symptoms  Plan Of Care/Follow-up recommendations:  Activity:  as tolerated Diet:  heart healthy diet  Dawne Casali, NP 12/22/2016, 11:06 AM
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
## 2016-12-22 NOTE — Discharge Instructions (Signed)
For your ongoing mental health needs, you are advised to follow up [facility]. ° °[facility] ([facility] °[address] °[place], [place] [address] °[phone] °New patients are seen at the walk-in clinic every Tuesday from 9:00 am - 12:00 pm. °

## 2016-12-22 NOTE — ED Notes (Signed)
In the bathroom to shower and change scrubs 

## 2016-12-22 NOTE — Progress Notes (Signed)
CSW filed patient's notice of commitment change paperwork into IVC logbook.   Orlena Garmon, LCSWA [facility]  Clinical Social Worker [phone] 

## 2016-12-22 NOTE — ED Notes (Signed)
Pharmacy tech into see 

## 2016-12-30 ENCOUNTER — Inpatient Hospital Stay (HOSPITAL_COMMUNITY)
Admission: EM | Admit: 2016-12-30 | Discharge: 2017-01-02 | DRG: 917 | Disposition: A | Discharge status: Other Acute Inpatient Hospital | Payer: Self-pay | Attending: Family Medicine | Admitting: Family Medicine

## 2016-12-30 ENCOUNTER — Emergency Department (HOSPITAL_COMMUNITY): Payer: Self-pay

## 2016-12-30 ENCOUNTER — Encounter (HOSPITAL_COMMUNITY): Payer: Self-pay | Admitting: Emergency Medicine

## 2016-12-30 DIAGNOSIS — E876 Hypokalemia: Secondary | ICD-10-CM | POA: Diagnosis present

## 2016-12-30 DIAGNOSIS — Z781 Physical restraint status: Secondary | ICD-10-CM

## 2016-12-30 DIAGNOSIS — F1010 Alcohol abuse, uncomplicated: Secondary | ICD-10-CM | POA: Diagnosis present

## 2016-12-30 DIAGNOSIS — Z915 Personal history of self-harm: Secondary | ICD-10-CM

## 2016-12-30 DIAGNOSIS — F17210 Nicotine dependence, cigarettes, uncomplicated: Secondary | ICD-10-CM | POA: Diagnosis present

## 2016-12-30 DIAGNOSIS — J9601 Acute respiratory failure with hypoxia: Secondary | ICD-10-CM | POA: Diagnosis present

## 2016-12-30 DIAGNOSIS — T43012A Poisoning by tricyclic antidepressants, intentional self-harm, initial encounter: Principal | ICD-10-CM | POA: Diagnosis present

## 2016-12-30 DIAGNOSIS — G9341 Metabolic encephalopathy: Secondary | ICD-10-CM | POA: Diagnosis present

## 2016-12-30 DIAGNOSIS — T50901A Poisoning by unspecified drugs, medicaments and biological substances, accidental (unintentional), initial encounter: Secondary | ICD-10-CM

## 2016-12-30 DIAGNOSIS — Y906 Blood alcohol level of 120-199 mg/100 ml: Secondary | ICD-10-CM | POA: Diagnosis present

## 2016-12-30 DIAGNOSIS — G929 Unspecified toxic encephalopathy: Secondary | ICD-10-CM | POA: Diagnosis present

## 2016-12-30 DIAGNOSIS — F1290 Cannabis use, unspecified, uncomplicated: Secondary | ICD-10-CM | POA: Diagnosis present

## 2016-12-30 DIAGNOSIS — G92 Toxic encephalopathy: Secondary | ICD-10-CM | POA: Diagnosis present

## 2016-12-30 DIAGNOSIS — T43011A Poisoning by tricyclic antidepressants, accidental (unintentional), initial encounter: Secondary | ICD-10-CM | POA: Diagnosis present

## 2016-12-30 DIAGNOSIS — F319 Bipolar disorder, unspecified: Secondary | ICD-10-CM | POA: Diagnosis present

## 2016-12-30 DIAGNOSIS — Z79899 Other long term (current) drug therapy: Secondary | ICD-10-CM

## 2016-12-30 DIAGNOSIS — T50904A Poisoning by unspecified drugs, medicaments and biological substances, undetermined, initial encounter: Secondary | ICD-10-CM

## 2016-12-30 LAB — BLOOD GAS, ARTERIAL
Acid-Base Excess: 0.1 mmol/L (ref 0.0–2.0)
BICARBONATE: 24.6 mmol/L (ref 20.0–28.0)
Drawn by: 225631
FIO2: 100
LHR: 18 {breaths}/min
O2 Saturation: 99.5 %
PATIENT TEMPERATURE: 98.6
PCO2 ART: 41.8 mmHg (ref 32.0–48.0)
PEEP: 5 cmH2O
PO2 ART: 365 mmHg — AB (ref 83.0–108.0)
VT: 0.54 mL
pH, Arterial: 7.388 (ref 7.350–7.450)

## 2016-12-30 LAB — COMPREHENSIVE METABOLIC PANEL
ALT: 62 U/L (ref 17–63)
ANION GAP: 11 (ref 5–15)
AST: 49 U/L — AB (ref 15–41)
Albumin: 4.3 g/dL (ref 3.5–5.0)
Alkaline Phosphatase: 78 U/L (ref 38–126)
BUN: 6 mg/dL (ref 6–20)
CHLORIDE: 106 mmol/L (ref 101–111)
CO2: 24 mmol/L (ref 22–32)
Calcium: 9 mg/dL (ref 8.9–10.3)
Creatinine, Ser: 0.83 mg/dL (ref 0.61–1.24)
Glucose, Bld: 106 mg/dL — ABNORMAL HIGH (ref 65–99)
POTASSIUM: 3.5 mmol/L (ref 3.5–5.1)
Sodium: 141 mmol/L (ref 135–145)
TOTAL PROTEIN: 7.6 g/dL (ref 6.5–8.1)
Total Bilirubin: 0.9 mg/dL (ref 0.3–1.2)

## 2016-12-30 LAB — CBC WITH DIFFERENTIAL/PLATELET
Basophils Absolute: 0 10*3/uL (ref 0.0–0.1)
Basophils Relative: 0 %
Eosinophils Absolute: 0.3 10*3/uL (ref 0.0–0.7)
Eosinophils Relative: 4 %
HEMATOCRIT: 43.5 % (ref 39.0–52.0)
HEMOGLOBIN: 15.9 g/dL (ref 13.0–17.0)
LYMPHS PCT: 36 %
Lymphs Abs: 2.7 10*3/uL (ref 0.7–4.0)
MCH: 34.6 pg — ABNORMAL HIGH (ref 26.0–34.0)
MCHC: 36.6 g/dL — AB (ref 30.0–36.0)
MCV: 94.8 fL (ref 78.0–100.0)
MONO ABS: 0.7 10*3/uL (ref 0.1–1.0)
MONOS PCT: 9 %
NEUTROS ABS: 3.8 10*3/uL (ref 1.7–7.7)
NEUTROS PCT: 51 %
Platelets: 248 10*3/uL (ref 150–400)
RBC: 4.59 MIL/uL (ref 4.22–5.81)
RDW: 12.7 % (ref 11.5–15.5)
WBC: 7.6 10*3/uL (ref 4.0–10.5)

## 2016-12-30 LAB — RAPID URINE DRUG SCREEN, HOSP PERFORMED
Amphetamines: NOT DETECTED
Barbiturates: NOT DETECTED
Benzodiazepines: POSITIVE — AB
COCAINE: NOT DETECTED
OPIATES: NOT DETECTED
Tetrahydrocannabinol: POSITIVE — AB

## 2016-12-30 LAB — ACETAMINOPHEN LEVEL

## 2016-12-30 LAB — SALICYLATE LEVEL

## 2016-12-30 LAB — PROTIME-INR
INR: 0.97
Prothrombin Time: 12.9 seconds (ref 11.4–15.2)

## 2016-12-30 LAB — ETHANOL: ALCOHOL ETHYL (B): 185 mg/dL — AB (ref <5)

## 2016-12-30 LAB — CBG MONITORING, ED: GLUCOSE-CAPILLARY: 105 mg/dL — AB (ref 65–99)

## 2016-12-30 MED ORDER — PROPOFOL 1000 MG/100ML IV EMUL
5.0000 ug/kg/min | INTRAVENOUS | Status: DC
  Administered 2016-12-30: 10 ug/kg/min via INTRAVENOUS
  Filled 2016-12-30: qty 100

## 2016-12-30 MED ORDER — NALOXONE HCL 2 MG/2ML IJ SOSY
1.0000 mg | PREFILLED_SYRINGE | Freq: Once | INTRAMUSCULAR | Status: AC
  Administered 2016-12-30: 1 mg via INTRAVENOUS

## 2016-12-30 MED ORDER — SODIUM CHLORIDE 0.9 % IV BOLUS (SEPSIS)
1000.0000 mL | Freq: Once | INTRAVENOUS | Status: AC
  Administered 2016-12-30: 1000 mL via INTRAVENOUS

## 2016-12-30 MED ORDER — NALOXONE HCL 0.4 MG/ML IJ SOLN
0.4000 mg | Freq: Once | INTRAMUSCULAR | Status: AC
  Administered 2016-12-30: 0.4 mg via INTRAVENOUS

## 2016-12-30 MED ORDER — SODIUM BICARBONATE 8.4 % IV SOLN
INTRAVENOUS | Status: AC
  Filled 2016-12-30: qty 50

## 2016-12-30 MED ORDER — SODIUM CHLORIDE 0.9 % IV BOLUS (SEPSIS)
2000.0000 mL | Freq: Once | INTRAVENOUS | Status: AC
  Administered 2016-12-30: 2000 mL via INTRAVENOUS

## 2016-12-30 MED ORDER — NICARDIPINE HCL IN NACL 20-0.86 MG/200ML-% IV SOLN
3.0000 mg/h | Freq: Once | INTRAVENOUS | Status: AC
  Administered 2016-12-30: 3 mg/h via INTRAVENOUS
  Filled 2016-12-30: qty 200

## 2016-12-30 MED ORDER — SODIUM BICARBONATE 8.4 % IV SOLN
50.0000 meq | Freq: Once | INTRAVENOUS | Status: AC
  Administered 2016-12-30: 50 meq via INTRAVENOUS

## 2016-12-30 NOTE — ED Notes (Signed)
Bed: RESA Expected date:  Expected time:  Means of arrival:  Comments: OD 

## 2016-12-30 NOTE — ED Provider Notes (Addendum)
[facility] DEPT Provider Note   CSN: [id number] Arrival date & time: 12/30/16  2134     History   Chief Complaint Chief Complaint  Patient presents with  . Drug Overdose    HPI Adam Novak is a 26 y.o. male.  Level V caveat for urgent need for intervention. History is very sketchy. EMS was called to the home by family members. Patient was drinking alcohol. He took an unknown number of amitriptyline 75 mg. He was initially alert and able to speak, but he became unresponsive en route to the ED. Past medical history includes multiple psychiatric disorders, alcohol abuse, polysubstance abuse.      Past Medical History:  Diagnosis Date  . Bipolar 1 disorder [facility])     Patient Active Problem List   Diagnosis Date Noted  . Substance induced mood disorder ([place]) 12/21/2016  . Polysubstance dependence ([place]) 12/21/2016  . Bipolar affective disorder, depressed, severe ([place]) 08/07/2016  . Bipolar 1 disorder ([place]) 12/16/2015  . Alcohol dependence with alcohol-induced mood disorder ([place])   . Alcohol use disorder, severe, dependence ([place]) 12/15/2015  . Bipolar I disorder, most recent episode depressed ([place]) 05/15/2015  . Dog bite of left calf 06/19/2014  . Alcohol abuse 06/18/2014  . Alcohol withdrawal ([place]) 06/18/2014  . Alcohol intoxication, episodic ([place]) 06/18/2014  . Encephalopathy 06/18/2014  . Leukocytosis   . Polysubstance abuse   . CLOSED FRACTURE OF NECK OF METACARPAL BONE 06/16/2010    Past Surgical History:  Procedure Laterality Date  . KNEE SURGERY Right        Home Medications    Prior to Admission medications   Medication Sig Start Date End Date Taking? Authorizing Provider  amitriptyline (ELAVIL) 75 MG tablet Take 75 mg by mouth once.   Yes [provider]  divalproex (DEPAKOTE ER) 500 MG 24 hr tablet Take 2 tablets (1,000 mg total) by mouth at bedtime. 12/22/16  Yes [name], NP  gabapentin (NEURONTIN) 300 MG capsule Take 1  capsule (300 mg total) by mouth 3 (three) times daily. 12/22/16  Yes [name], NP  hydrOXYzine (ATARAX/VISTARIL) 50 MG tablet Take 1 tablet (50 mg total) by mouth every 6 (six) hours as needed for anxiety. 12/22/16  Yes [name], NP  Multiple Vitamin (MULTIVITAMIN WITH MINERALS) TABS tablet Take 1 tablet by mouth daily.   Yes [provider]  naltrexone (DEPADE) 50 MG tablet Take 1 tablet (50 mg total) by mouth daily. 12/22/16  Yes [name], NP  traZODone (DESYREL) 100 MG tablet Take 1 tablet (100 mg total) by mouth at bedtime. 12/22/16  Yes [name], NP  traZODone (DESYREL) 50 MG tablet Take 1 tablet (50 mg total) by mouth at bedtime. Patient not taking: Reported on 12/22/2016 08/31/16   [name], NP    Family History History reviewed. No pertinent family history.  Social History Social History  Substance Use Topics  . Smoking status: Current Some Day Smoker    Packs/day: 0.50    Types: Cigarettes  . Smokeless tobacco: Never Used  . Alcohol use Yes     Comment: socially     Allergies   Patient has no known allergies.   Review of Systems Review of Systems  Unable to perform ROS: Acuity of condition (Urgent need for intervention)     Physical Exam Updated Vital Signs BP (!) 182/121 (BP Location: Right Arm)   Pulse (!) 127   Temp (!) 96.9 F (36.1 C) (Rectal)  Resp (!) 21   Ht 5' 8"  (1.727 m)   Wt 90.7 kg (200 lb)   SpO2 100%   BMI 30.41 kg/m   Physical Exam  Constitutional:  Minimal response to sternal rub  HENT:  Head: Normocephalic and atraumatic.  Pupils were approximately 3 mm in diameter and fixed  Eyes: Conjunctivae are normal.  Neck: Neck supple.  Cardiovascular: Regular rhythm.   Tachycardic  Pulmonary/Chest: Effort normal and breath sounds normal.  Abdominal: Soft. Bowel sounds are normal.  Musculoskeletal:  unable  Neurological:  obtunded  Skin:  Areas of ecchymosis on his bilateral legs  Psychiatric:    Unable  Nursing note and vitals reviewed.    ED Treatments / Results  Labs (all labs ordered are listed, but only abnormal results are displayed) Labs Reviewed  COMPREHENSIVE METABOLIC PANEL - Abnormal; Notable for the following:       Result Value   Glucose, Bld 106 (*)    AST 49 (*)    All other components within normal limits  ACETAMINOPHEN LEVEL - Abnormal; Notable for the following:    Acetaminophen (Tylenol), Serum <10 (*)    All other components within normal limits  [name] - Abnormal; Notable for the following:    Alcohol, Ethyl (B) 185 (*)    All other components within normal limits  RAPID URINE DRUG SCREEN, HOSP PERFORMED - Abnormal; Notable for the following:    Benzodiazepines POSITIVE (*)    Tetrahydrocannabinol POSITIVE (*)    All other components within normal limits  CBC WITH DIFFERENTIAL/PLATELET - Abnormal; Notable for the following:    MCH 34.6 (*)    MCHC 36.6 (*)    All other components within normal limits  BLOOD GAS, ARTERIAL - Abnormal; Notable for the following:    pO2, Arterial 365 (*)    All other components within normal limits  CBG MONITORING, ED - Abnormal; Notable for the following:    Glucose-Capillary 105 (*)    All other components within normal limits  SALICYLATE LEVEL  PROTIME-INR  TRIGLYCERIDES    EKG  EKG Interpretation  Date/Time:  Sunday December 30 2016 21:42:31 EDT Ventricular Rate:  147 PR Interval:    QRS Duration: 104 QT Interval:  288 QTC Calculation: 451 R Axis:   141 Text Interpretation:  Sinus tachycardia Consider right ventricular hypertrophy Confirmed by Nat Christen [id number]) on 12/30/2016 10:58:23 PM       Radiology Ct Head Wo Contrast  Result Date: 12/30/2016 CLINICAL DATA:  Amitriptyline overdose, altered mental status EXAM: CT HEAD WITHOUT CONTRAST TECHNIQUE: Contiguous axial images were obtained from the base of the skull through the vertex without intravenous contrast. COMPARISON:  Head CT 06/18/2014  FINDINGS: Brain: No mass lesion, intraparenchymal hemorrhage or extra-axial collection. No evidence of acute cortical infarct. Brain parenchyma and CSF-containing spaces are normal for age. Vascular: No hyperdense vessel or unexpected calcification. Skull: Normal visualized skull base, calvarium and extracranial soft tissues. Sinuses/Orbits: No sinus fluid levels or advanced mucosal thickening. No mastoid effusion. Normal orbits. IMPRESSION: Normal head CT. Electronically Signed   By: Ulyses Jarred M.D.   On: 12/30/2016 23:06   Dg Chest Portable 1 View  Result Date: 12/30/2016 CLINICAL DATA:  Endotracheal and enteric tube placement. EXAM: PORTABLE CHEST 1 VIEW COMPARISON:  Radiograph 03/25/2016 FINDINGS: Endotracheal tube is high in positioning 5.5 cm above the carina, above the thoracic inlet. Tip and side port of the enteric tube below the diaphragm in the stomach. Lung volumes are low. Prominent heart size  likely accentuated by technique and low lung volumes. Subsegmental atelectasis at the left lung base. No confluent airspace disease, pleural fluid or pneumothorax. No acute osseous abnormality. IMPRESSION: 1. Endotracheal tube high in positioning 5.5 cm from the carina and above the thoracic inlet. Advancement of 3 cm would lead to the optimal placement. Tip and side port of the enteric tube in the stomach. 2. Low lung volumes with left basilar atelectasis. Prominent heart size likely accentuated by low lung volumes and portable technique. Electronically Signed   By: Jeb Levering M.D.   On: 12/30/2016 22:38    Procedures Procedure Name: Intubation Date/Time: 12/30/2016 10:00 PM Performed by: Nat Christen Pre-anesthesia Checklist: Emergency Drugs available, Suction available and Patient being monitored Oxygen Delivery Method: Simple face mask Preoxygenation: Pre-oxygenation with 100% oxygen Induction Type: Rapid sequence Ventilation: Mask ventilation without difficulty Laryngoscope Size: Mac  and 3 Grade View: Grade I Tube type: Non-subglottic suction tube Tube size: 7.0 mm Number of attempts: 1 Airway Equipment and Method: Stylet Placement Confirmation: ETT inserted through vocal cords under direct vision Tube secured with: Tape Dental Injury: Teeth and Oropharynx as per pre-operative assessment  Comments: Patient was premedicated with etomidate 15 mg and succinylcholine 125 mg. He was intubated without difficulty with a 7.0 endotracheal tube.       (including critical care time)  Medications Ordered in ED Medications  propofol (DIPRIVAN) 1000 MG/100ML infusion (15 mcg/kg/min  90.7 kg Intravenous Rate/Dose Change 12/30/16 2303)  sodium chloride 0.9 % bolus 1,000 mL (1,000 mLs Intravenous New Bag/Given 12/30/16 2312)  sodium chloride 0.9 % bolus 1,000 mL (0 mLs Intravenous Stopped 12/30/16 2200)  sodium bicarbonate injection 50 mEq (50 mEq Intravenous Given 12/30/16 2217)  naloxone [facility]) injection 1 mg (1 mg Intravenous Given 12/30/16 2155)  naloxone [facility]) injection 1 mg (1 mg Intravenous Given 12/30/16 2205)  naloxone [facility]) injection 0.4 mg (0.4 mg Intravenous Given 12/30/16 2148)  sodium chloride 0.9 % bolus 2,000 mL (2,000 mLs Intravenous New Bag/Given 12/30/16 2220)     Initial Impression / Assessment and Plan / ED Course  I have reviewed the triage vital signs and the nursing notes.  Pertinent labs & imaging results that were available during my care of the patient were reviewed by me and considered in my medical decision making (see chart for details).    CRITICAL CARE Performed by: Nat Christen Total critical care time: 60 minutes Critical care time was exclusive of separately billable procedures and treating other patients. Critical care was necessary to treat or prevent imminent or life-threatening deterioration. Critical care was time spent personally by me on the following activities: development of treatment plan with patient and/or surrogate as well as  nursing, discussions with consultants, evaluation of patient's response to treatment, examination of patient, obtaining history from patient or surrogate, ordering and performing treatments and interventions, ordering and review of laboratory studies, ordering and review of radiographic studies, pulse oximetry and re-evaluation of patient's condition.  Patient presents with a suspected overdose of amitriptyline and alcohol. His condition deteriorated rapidly while en route to the hospital. He was intubated secondary to failure to protect his airway. He was initially hypertensive and tachycardic. Vigorous IV hydration; Narcan 1 mg 2 aliquots without any obvious change in status. Bicarb administered. Endotracheal tube positioned well. CT head negative. Will start antihypertensive. Discussed with critical care medicine.  Final Clinical Impressions(s) / ED Diagnoses   Final diagnoses:  Drug overdose, undetermined intent, initial encounter    New Prescriptions New Prescriptions  No medications on file     Nat Christen, MD 12/30/16 2338    Nat Christen, MD 12/30/16 9030    Nat Christen, MD 12/30/16 2345    Nat Christen, MD 01/02/17 [phone]

## 2016-12-30 NOTE — ED Triage Notes (Signed)
EMS reports taking appx 10-30 Amitriptyline 75mg  and alcohol. Initially pt was alert and speaking to EMS on arrival but began then became unresponsive and not responsive to stimuli and pin point pupils. Tachycardic per ems.

## 2016-12-31 ENCOUNTER — Encounter (HOSPITAL_COMMUNITY): Payer: Self-pay | Admitting: Internal Medicine

## 2016-12-31 DIAGNOSIS — T43011A Poisoning by tricyclic antidepressants, accidental (unintentional), initial encounter: Secondary | ICD-10-CM | POA: Diagnosis present

## 2016-12-31 LAB — MAGNESIUM
MAGNESIUM: 1.7 mg/dL (ref 1.7–2.4)
Magnesium: 1.7 mg/dL (ref 1.7–2.4)

## 2016-12-31 LAB — BLOOD GAS, ARTERIAL
ACID-BASE EXCESS: 3.3 mmol/L — AB (ref 0.0–2.0)
BICARBONATE: 27.4 mmol/L (ref 20.0–28.0)
Drawn by: 11249
FIO2: 40
LHR: 18 {breaths}/min
MECHVT: 540 mL
O2 Saturation: 98.7 %
PEEP/CPAP: 5 cmH2O
Patient temperature: 98.2
pCO2 arterial: 41 mmHg (ref 32.0–48.0)
pH, Arterial: 7.438 (ref 7.350–7.450)
pO2, Arterial: 122 mmHg — ABNORMAL HIGH (ref 83.0–108.0)

## 2016-12-31 LAB — BASIC METABOLIC PANEL
ANION GAP: 7 (ref 5–15)
Anion gap: 9 (ref 5–15)
BUN: <5 mg/dL — ABNORMAL LOW (ref 6–20)
BUN: <5 mg/dL — ABNORMAL LOW (ref 6–20)
CHLORIDE: 102 mmol/L (ref 101–111)
CO2: 25 mmol/L (ref 22–32)
CO2: 28 mmol/L (ref 22–32)
CREATININE: 0.78 mg/dL (ref 0.61–1.24)
Calcium: 7.1 mg/dL — ABNORMAL LOW (ref 8.9–10.3)
Calcium: 7.3 mg/dL — ABNORMAL LOW (ref 8.9–10.3)
Chloride: 108 mmol/L (ref 101–111)
Creatinine, Ser: 0.83 mg/dL (ref 0.61–1.24)
GFR calc Af Amer: >60 mL/min (ref >60)
GFR calc Af Amer: >60 mL/min (ref >60)
GFR calc non Af Amer: >60 mL/min (ref >60)
GFR calc non Af Amer: >60 mL/min (ref >60)
GLUCOSE: 108 mg/dL — AB (ref 65–99)
GLUCOSE: 106 mg/dL — AB (ref 65–99)
POTASSIUM: 4.1 mmol/L (ref 3.5–5.1)
Potassium: 3.4 mmol/L — ABNORMAL LOW (ref 3.5–5.1)
SODIUM: 140 mmol/L (ref 135–145)
Sodium: 139 mmol/L (ref 135–145)

## 2016-12-31 LAB — CBC
HEMATOCRIT: 39.5 % (ref 39.0–52.0)
HEMOGLOBIN: 13.7 g/dL (ref 13.0–17.0)
MCH: 34 pg (ref 26.0–34.0)
MCHC: 34.7 g/dL (ref 30.0–36.0)
MCV: 98 fL (ref 78.0–100.0)
Platelets: 216 10*3/uL (ref 150–400)
RBC: 4.03 MIL/uL — AB (ref 4.22–5.81)
RDW: 13.1 % (ref 11.5–15.5)
WBC: 9.5 10*3/uL (ref 4.0–10.5)

## 2016-12-31 LAB — PHOSPHORUS
Phosphorus: 2.8 mg/dL (ref 2.5–4.6)
Phosphorus: 2.8 mg/dL (ref 2.5–4.6)

## 2016-12-31 LAB — TRIGLYCERIDES: TRIGLYCERIDES: 445 mg/dL — AB (ref <150)

## 2016-12-31 LAB — OSMOLALITY: Osmolality: 309 mOsm/kg — ABNORMAL HIGH (ref 275–295)

## 2016-12-31 LAB — MRSA PCR SCREENING: MRSA BY PCR: POSITIVE — AB

## 2016-12-31 MED ORDER — CHLORHEXIDINE GLUCONATE CLOTH 2 % EX PADS
6.0000 | MEDICATED_PAD | Freq: Every day | CUTANEOUS | Status: DC
  Administered 2017-01-01: 6 via TOPICAL

## 2016-12-31 MED ORDER — ACETAMINOPHEN 325 MG PO TABS
650.0000 mg | ORAL_TABLET | ORAL | Status: DC | PRN
  Administered 2017-01-01: 650 mg
  Filled 2016-12-31: qty 2

## 2016-12-31 MED ORDER — MUPIROCIN 2 % EX OINT
1.0000 "application " | TOPICAL_OINTMENT | Freq: Two times a day (BID) | CUTANEOUS | Status: DC
  Administered 2016-12-31 – 2017-01-02 (×5): 1 via NASAL
  Filled 2016-12-31 (×2): qty 22

## 2016-12-31 MED ORDER — THIAMINE HCL 100 MG/ML IJ SOLN
100.0000 mg | Freq: Every day | INTRAMUSCULAR | Status: DC
Start: 2016-12-31 — End: 2017-01-02
  Administered 2016-12-31 – 2017-01-02 (×3): 100 mg via INTRAVENOUS
  Filled 2016-12-31 (×4): qty 2

## 2016-12-31 MED ORDER — SODIUM CHLORIDE 0.9 % IV SOLN: 250.0000 mL | INTRAVENOUS | Status: DC | PRN

## 2016-12-31 MED ORDER — DEXMEDETOMIDINE HCL IN NACL 200 MCG/50ML IV SOLN: 0.4000 ug/kg/h | INTRAVENOUS | Status: DC

## 2016-12-31 MED ORDER — CHLORHEXIDINE GLUCONATE CLOTH 2 % EX PADS: 6.0000 | MEDICATED_PAD | Freq: Every day | CUTANEOUS | Status: DC

## 2016-12-31 MED ORDER — ENOXAPARIN SODIUM 40 MG/0.4ML ~~LOC~~ SOLN
40.0000 mg | SUBCUTANEOUS | Status: DC
  Administered 2016-12-31 – 2017-01-02 (×3): 40 mg via SUBCUTANEOUS
  Filled 2016-12-31 (×4): qty 0.4

## 2016-12-31 MED ORDER — FAMOTIDINE 40 MG/5ML PO SUSR
20.0000 mg | Freq: Two times a day (BID) | ORAL | Status: DC
  Filled 2016-12-31: qty 2.5

## 2016-12-31 MED ORDER — RANITIDINE HCL 150 MG/10ML PO SYRP
150.0000 mg | ORAL_SOLUTION | Freq: Two times a day (BID) | ORAL | Status: DC
  Administered 2016-12-31: 150 mg
  Filled 2016-12-31: qty 10

## 2016-12-31 MED ORDER — ORAL CARE MOUTH RINSE
15.0000 mL | Freq: Four times a day (QID) | OROMUCOSAL | Status: DC
  Administered 2016-12-31 (×2): 15 mL via OROMUCOSAL

## 2016-12-31 MED ORDER — CHLORHEXIDINE GLUCONATE 0.12% ORAL RINSE (MEDLINE KIT)
15.0000 mL | Freq: Two times a day (BID) | OROMUCOSAL | Status: DC
  Administered 2016-12-31: 15 mL via OROMUCOSAL

## 2016-12-31 MED ORDER — SODIUM BICARBONATE 8.4 % IV SOLN
INTRAVENOUS | Status: AC
  Administered 2016-12-31 – 2017-01-01 (×5): via INTRAVENOUS
  Filled 2016-12-31 (×5): qty 150

## 2016-12-31 MED ORDER — PROPOFOL 1000 MG/100ML IV EMUL
5.0000 ug/kg/min | INTRAVENOUS | Status: DC
  Administered 2016-12-31: 30 ug/kg/min via INTRAVENOUS
  Administered 2016-12-31: 60 ug/kg/min via INTRAVENOUS
  Administered 2016-12-31: 45 ug/kg/min via INTRAVENOUS
  Filled 2016-12-31: qty 200

## 2016-12-31 MED ORDER — MAGNESIUM SULFATE 2 GM/50ML IV SOLN
2.0000 g | Freq: Once | INTRAVENOUS | Status: AC
  Administered 2016-12-31: 2 g via INTRAVENOUS
  Filled 2016-12-31: qty 50

## 2016-12-31 MED ORDER — FOLIC ACID 5 MG/ML IJ SOLN
1.0000 mg | Freq: Every day | INTRAMUSCULAR | Status: DC
  Administered 2016-12-31 – 2017-01-02 (×2): 1 mg via INTRAVENOUS
  Filled 2016-12-31 (×4): qty 0.2

## 2016-12-31 NOTE — ED Notes (Signed)
Poison control representative Revonda Standardllison notified for an update of pt status and need for admission. Revonda Standardllison advised they will follow up in morning.

## 2016-12-31 NOTE — Progress Notes (Addendum)
CSW called to initiate IVC paperwork, CSW will meet physician at 4:00 to assist with completion.  4:00 PM IVC paperwork completed, confirmed Magistrate received, pt. To be served. Original copy left on patient chart.  Nurse informed.   Vivi BarrackNicole Marvel Sapp, Theresia MajorsLCSWA, MSW Clinical Social Worker 5E and Psychiatric Service Line [phone] 12/31/2016  3:16 PM

## 2016-12-31 NOTE — Progress Notes (Signed)
When connected to suction, subglottic tubing had dark red blood in it, < 10cc. RN notified MD, will continue to monitor.

## 2016-12-31 NOTE — Progress Notes (Signed)
eLink Physician-Brief Progress Note Patient Name: Adam Novak DOB: 03-27-1991 MRN: [id number]   Date of Service  12/31/2016  HPI/Events of Note  Request to change Adam Novak  Restraints to bilateral soft wrist restraints.   eICU Interventions  Will change restraints to bilateral soft wrist.      Intervention Category Minor Interventions: Agitation / anxiety - evaluation and management  Adam Novak 12/31/2016, 3:04 AM

## 2016-12-31 NOTE — Progress Notes (Signed)
PULMONARY / CRITICAL CARE MEDICINE   Name: Adam Novak MRN: [id number] DOB: 12-26-1990    ADMISSION DATE:  12/30/2016 CONSULTATION DATE:  12/31/16  REFERRING MD:  ED  CHIEF COMPLAINT:  Overdose  BRIEF SUMMARY:   26 y/o M with PMH of daily alcohol, MDMA and marijuana use, frequent threats of self harm, psychiatric disease (prior reported dx of bipolar disorder) admitted 7/22 with intentional amitriptyline overdose.  On scene, he reportedly vomited up pills and did not want to go to the ER. En route, he became less responsive and was intubated in the ER for airway protection.  EKG initially normal QRS / ST.  Labs notable for AGMA, elevated ETOH.  UDS + for benzo's & THC.       SUBJECTIVE:  Mother reports she is very concerned about her son.  In the last few months, he has become increasingly unpredictable > got drunk and climbed to the top of a building and threatened to jump.  She lost her oldest daughter 2 years ago in a car crash (she was a Pro[employer], [employer] employee).    VITAL SIGNS: BP (!) 138/91   Pulse (!) 102   Temp (!) 97.5 F (36.4 C) (Axillary)   Resp 18   Ht 6' (1.829 m)   Wt 217 lb 6 oz (98.6 kg)   SpO2 100%   BMI 29.48 kg/m   HEMODYNAMICS:    VENTILATOR SETTINGS: Vent Mode: PRVC FiO2 (%):  [40 %-100 %] 40 % Set Rate:  [18 bmp] 18 bmp Vt Set:  [540 mL] 540 mL PEEP:  [5 cmH20] 5 cmH20 Plateau Pressure:  [14 cmH20-16 cmH20] 15 cmH20  INTAKE / OUTPUT: I/O last 3 completed shifts: In: 1215.6 [I.V.:1215.6] Out: 2095 [Urine:2095]  PHYSICAL EXAMINATION: General:  Young adult male on vent, intermittently agitated  HEENT: MM pink/moist, no jvd PSY: intermittent agitation  Neuro: Sedate, squeezed hands to command, MAE, normal strength/strong, pupils 4-155mm / sluggish  CV: s1s2 rrr, no m/r/g PULM: even/non-labored, lungs bilaterally clear  HQ:IONGGI:soft, non-tender, bsx4 active  Extremities: warm/dry, no edema  Skin: no rashes or lesions  LABS:  BMET  Recent Labs Lab  12/30/16 2150 12/31/16 0325  NA 141 140  K 3.5 4.1  CL 106 108  CO2 24 25  BUN 6 <5*  CREATININE 0.83 0.78  GLUCOSE 106* 108*    Electrolytes  Recent Labs Lab 12/30/16 2150 12/31/16 0325  CALCIUM 9.0 7.1*  MG  --  1.7  1.7  PHOS  --  2.8  2.8    CBC  Recent Labs Lab 12/30/16 2150 12/31/16 0325  WBC 7.6 9.5  HGB 15.9 13.7  HCT 43.5 39.5  PLT 248 216    Coag's  Recent Labs Lab 12/30/16 2105  INR 0.97    Sepsis Markers No results for input(s): LATICACIDVEN, PROCALCITON, O2SATVEN in the last 168 hours.  ABG  Recent Labs Lab 12/30/16 2300 12/31/16 0413  PHART 7.388 7.438  PCO2ART 41.8 41.0  PO2ART 365* 122*    Liver Enzymes  Recent Labs Lab 12/30/16 2150  AST 49*  ALT 62  ALKPHOS 78  BILITOT 0.9  ALBUMIN 4.3    Cardiac Enzymes No results for input(s): TROPONINI, PROBNP in the last 168 hours.  Glucose  Recent Labs Lab 12/30/16 2158  GLUCAP 105*    Imaging Ct Head Wo Contrast  Result Date: 12/30/2016 CLINICAL DATA:  Amitriptyline overdose, altered mental status EXAM: CT HEAD WITHOUT CONTRAST TECHNIQUE: Contiguous axial images were obtained from the base  of the skull through the vertex without intravenous contrast. COMPARISON:  Head CT 06/18/2014 FINDINGS: Brain: No mass lesion, intraparenchymal hemorrhage or extra-axial collection. No evidence of acute cortical infarct. Brain parenchyma and CSF-containing spaces are normal for age. Vascular: No hyperdense vessel or unexpected calcification. Skull: Normal visualized skull base, calvarium and extracranial soft tissues. Sinuses/Orbits: No sinus fluid levels or advanced mucosal thickening. No mastoid effusion. Normal orbits. IMPRESSION: Normal head CT. Electronically Signed   By: Deatra Robinson M.D.   On: 12/30/2016 23:06   Dg Chest Portable 1 View  Result Date: 12/30/2016 CLINICAL DATA:  Endotracheal and enteric tube placement. EXAM: PORTABLE CHEST 1 VIEW COMPARISON:  Radiograph 03/25/2016  FINDINGS: Endotracheal tube is high in positioning 5.5 cm above the carina, above the thoracic inlet. Tip and side port of the enteric tube below the diaphragm in the stomach. Lung volumes are low. Prominent heart size likely accentuated by technique and low lung volumes. Subsegmental atelectasis at the left lung base. No confluent airspace disease, pleural fluid or pneumothorax. No acute osseous abnormality. IMPRESSION: 1. Endotracheal tube high in positioning 5.5 cm from the carina and above the thoracic inlet. Advancement of 3 cm would lead to the optimal placement. Tip and side port of the enteric tube in the stomach. 2. Low lung volumes with left basilar atelectasis. Prominent heart size likely accentuated by low lung volumes and portable technique. Electronically Signed   By: Rubye Oaks M.D.   On: 12/30/2016 22:38     STUDIES:  CT Head 7/22 >> normal  HIV 7/22 >>   CULTURES:   ANTIBIOTICS:   SIGNIFICANT EVENTS: 7/22  Admit with OD  LINES/TUBES: ET 7/23 >>  DISCUSSION: 26 y/o M admitted with TCA OD w/evidence severe cardiac toxicity (QRS >140ms, R wave in aVR). HPI supports CNS toxicity.   ASSESSMENT / PLAN:  PULMONARY A: Intubated for airway protection P:   PRVC 8cc/kg  Wean PEEP / FiO2 for sats > 92% Intermittent CXR  Aspiration precautions  SBT / WUA   CARDIOVASCULAR A:  TCA Cardiac toxicity P:  Continue Bicarb gtt, reduce to 115ml/hr  Trend EKG Q6 Tele monitoring   RENAL A:   TCA OD P:   Trend BMP / urinary output, repeat BMP at 12:00 Replace electrolytes as indicated Avoid nephrotoxic agents, ensure adequate renal perfusion HCO3 gtt as above Serum osmolality / osmolar gap pending to r/o isopropyl ingestion   GASTROINTESTINAL A:   GI PPX P:   Pepcid for SUP  NPO / OGT to LIS Consider TF if remains intubated in am  HEMATOLOGIC A:   DVT PPX P:  Trend CBC Lovenox for DVT prophylaxis   INFECTIOUS A:   No indication of acute infectious  process  P:   Monitor fever curve / WBC trend   ENDOCRINE A:   N/A   P:   Monitor glucose on BMP   NEUROLOGIC / PSY A:   TCA CNS toxicity Intentional OD P:   RASS goal: 0 to -1 Propofol for sedation  Fentanyl gtt for pain  Will need PSY consult once extubated  Consider IVC SW consult   FAMILY  - Updates: Mother at bedside, updated on plan of care 7/23  - Inter-disciplinary family meet or Palliative Care meeting due by:  day 7  CC Time:  30 minutes   Canary Brim, NP-C [facility] Pgr: [phone] or if no answer [phone] 12/31/2016, 7:38 AM

## 2016-12-31 NOTE — H&P (Addendum)
PULMONARY / CRITICAL CARE MEDICINE   Name: Adam Novak MRN: [id number] DOB: 1990/08/21    ADMISSION DATE:  12/30/2016 CONSULTATION DATE:  12/31/16  REFERRING MD:  ED  CHIEF COMPLAINT:  Overdose  HISTORY OF PRESENT ILLNESS:   26yo M peritnent PMH of daily alcohol, MDMA and marijuana use, frequent threats of self harm, psychiatric disease who is being admitted from ED after intentional amitryptiline OD.  Patient called his sister and stated he took a large quantity of pills which he found in the home which belong to his father.  Sister called mother who called EMS.  Police officer reports patient calm and cooperative on scene, states he vomited up pills he took and did not want to go to ed.  Enroute to ED he became less responsive and in ED RN reports initially responsive to pain and agitated leading to intubation for airway protection to allow for sedation.  In ed also hypertensive to >200 SBP and started on nicardipine and propofol.  CXR and CTH show no acute pathology.  EKG was ST with normal QRS. Labs nmotable for normal anion gap, elevated ETOH level, +benzo and THC screen. He was given multiple doses of Narcan w/o effect as well as 1 amp HCO3, and 3 L NS.  PAST MEDICAL HISTORY :  He  has a past medical history of Bipolar 1 disorder (HCC).  PAST SURGICAL HISTORY: He  has a past surgical history that includes Knee surgery (Right).  No Known Allergies  No current facility-administered medications on file prior to encounter.    Current Outpatient Prescriptions on File Prior to Encounter  Medication Sig  . divalproex (DEPAKOTE ER) 500 MG 24 hr tablet Take 2 tablets (1,000 mg total) by mouth at bedtime.  . gabapentin (NEURONTIN) 300 MG capsule Take 1 capsule (300 mg total) by mouth 3 (three) times daily.  . hydrOXYzine (ATARAX/VISTARIL) 50 MG tablet Take 1 tablet (50 mg total) by mouth every 6 (six) hours as needed for anxiety.  . Multiple Vitamin (MULTIVITAMIN WITH MINERALS) TABS  tablet Take 1 tablet by mouth daily.  . naltrexone (DEPADE) 50 MG tablet Take 1 tablet (50 mg total) by mouth daily.  . traZODone (DESYREL) 100 MG tablet Take 1 tablet (100 mg total) by mouth at bedtime.  . traZODone (DESYREL) 50 MG tablet Take 1 tablet (50 mg total) by mouth at bedtime. (Patient not taking: Reported on 12/22/2016)    FAMILY HISTORY:  His indicated that his mother is alive. He indicated that his father is alive. He indicated that only one of his two sisters is alive.    SOCIAL HISTORY: He  reports that he has been smoking Cigarettes.  He has been smoking about 0.50 packs per day. He has never used smokeless tobacco. He reports that he drinks alcohol. He reports that he uses drugs, including Marijuana, Benzodiazepines, and MDMA (Ecstacy).  REVIEW OF SYSTEMS:   Unable to obtain 2/2 sedated  SUBJECTIVE:  Unable to obtain 2/2 sedated  VITAL SIGNS: BP (!) 153/91   Pulse (!) 132   Temp (!) 96.9 F (36.1 C) (Rectal)   Resp 19   Ht 5\' 8"  (1.727 m)   Wt 200 lb (90.7 kg)   SpO2 98%   BMI 30.41 kg/m   HEMODYNAMICS:    VENTILATOR SETTINGS: Vent Mode: PRVC FiO2 (%):  [100 %] 100 % Set Rate:  [18 bmp] 18 bmp Vt Set:  [540 mL] 540 mL PEEP:  [5 cmH20] 5 cmH20 Plateau Pressure:  [  16 cmH20] 16 cmH20  INTAKE / OUTPUT: No intake/output data recorded.  PHYSICAL EXAMINATION: General:  NAD, healthy, well built Neuro:  Responsive to noxious, mae, non-focal HEENT:  Perrla, atnc, MMM Cardiovascular:  Tachy, regular, no cce, no murmur, good cap refill Lungs:  Cta, reg rate, no wheezing, intubated Abdomen:  +BS soft ntnd no hsm no masses Musculoskeletal:  Bruise left leg, no red/warm/swollen joints no deformity Skin:  Warm dry intact no rashes  LABS:  BMET  Recent Labs Lab 12/30/16 2150  NA 141  K 3.5  CL 106  CO2 24  BUN 6  CREATININE 0.83  GLUCOSE 106*    Electrolytes  Recent Labs Lab 12/30/16 2150  CALCIUM 9.0    CBC  Recent Labs Lab  12/30/16 2150  WBC 7.6  HGB 15.9  HCT 43.5  PLT 248    Coag's  Recent Labs Lab 12/30/16 2105  INR 0.97    Sepsis Markers No results for input(s): LATICACIDVEN, PROCALCITON, O2SATVEN in the last 168 hours.  ABG  Recent Labs Lab 12/30/16 2300  PHART 7.388  PCO2ART 41.8  PO2ART 365*    Liver Enzymes  Recent Labs Lab 12/30/16 2150  AST 49*  ALT 62  ALKPHOS 78  BILITOT 0.9  ALBUMIN 4.3    Cardiac Enzymes No results for input(s): TROPONINI, PROBNP in the last 168 hours.  Glucose  Recent Labs Lab 12/30/16 2158  GLUCAP 105*    Imaging Ct Head Wo Contrast  Result Date: 12/30/2016 CLINICAL DATA:  Amitriptyline overdose, altered mental status EXAM: CT HEAD WITHOUT CONTRAST TECHNIQUE: Contiguous axial images were obtained from the base of the skull through the vertex without intravenous contrast. COMPARISON:  Head CT 06/18/2014 FINDINGS: Brain: No mass lesion, intraparenchymal hemorrhage or extra-axial collection. No evidence of acute cortical infarct. Brain parenchyma and CSF-containing spaces are normal for age. Vascular: No hyperdense vessel or unexpected calcification. Skull: Normal visualized skull base, calvarium and extracranial soft tissues. Sinuses/Orbits: No sinus fluid levels or advanced mucosal thickening. No mastoid effusion. Normal orbits. IMPRESSION: Normal head CT. Electronically Signed   By: Deatra Robinson M.D.   On: 12/30/2016 23:06   Dg Chest Portable 1 View  Result Date: 12/30/2016 CLINICAL DATA:  Endotracheal and enteric tube placement. EXAM: PORTABLE CHEST 1 VIEW COMPARISON:  Radiograph 03/25/2016 FINDINGS: Endotracheal tube is high in positioning 5.5 cm above the carina, above the thoracic inlet. Tip and side port of the enteric tube below the diaphragm in the stomach. Lung volumes are low. Prominent heart size likely accentuated by technique and low lung volumes. Subsegmental atelectasis at the left lung base. No confluent airspace disease,  pleural fluid or pneumothorax. No acute osseous abnormality. IMPRESSION: 1. Endotracheal tube high in positioning 5.5 cm from the carina and above the thoracic inlet. Advancement of 3 cm would lead to the optimal placement. Tip and side port of the enteric tube in the stomach. 2. Low lung volumes with left basilar atelectasis. Prominent heart size likely accentuated by low lung volumes and portable technique. Electronically Signed   By: Rubye Oaks M.D.   On: 12/30/2016 22:38     STUDIES:    CULTURES:   ANTIBIOTICS:   SIGNIFICANT EVENTS:   LINES/TUBES: ET 7/23>>  DISCUSSION: TCA OD w/evidence severe cardiac toxicity (QRS >12ms, R wave in aVR). HPI supports CNS toxicity. Normal AG does not support most toxic alcohol ingestions.  ASSESSMENT / PLAN:  PULMONARY A: Intubated for airway protection P:   LTVV Daily awakening and SBT  CARDIOVASCULAR A:  TCA Cardiac toxicity P:  HCO3 gtt, q3 hr ekg, tele  RENAL A:   TCA OD P:   HCO3 gtt goal pH 7.5-7.55 Add on serum osm to calculate gap and r/o isopropyl ingestion  GASTROINTESTINAL A:   GI PPX P:   famotidine  HEMATOLOGIC A:   DVT PPX P:  LMWH  INFECTIOUS A:   N/a P:   VAP PPX  ENDOCRINE A:   N/A   P:   Monitor glucose  NEUROLOGIC/Psych A:   TCA CNS toxicity Intentional OD P:   RASS goal: -2 Propofol sedation Fentanyl pain Need Psych consult SW consult  FAMILY  - Updates: Parents and sister bedside in ED  - Inter-disciplinary family meet or Palliative Care meeting due by:  day 7  Full code Status Critical Prognosis Guarded  70 minutes critical care time in reviewing CXR, ekg, labs, ordering medications, exmaining the patient, coordinating care with RN.  Pulmonary and Critical Care Medicine [facility] Pager: [phone]  12/31/2016, 12:49 AM

## 2016-12-31 NOTE — ED Notes (Signed)
2145 Dr.Cook at bedside 2149 Dr.Cook request for intubation set up for patient. 2152 Respiratory therapy at bedside. 2209 15mg  Etomidate given IVP by I.Coggin RN 2210 125MG  Succinylcholine IVP by I.Coggin RN  2211 Intubation using 7.0 ETT done by Dr.Cook 22cm at the lip.

## 2016-12-31 NOTE — Progress Notes (Signed)
Initial Nutrition Assessment  DOCUMENTATION CODES:   Not applicable  INTERVENTION:  - If TF initiated, recommend Vital AF 1.2 @ 25 mL/hr to increase by 10 mL every 8 hours to reach goal rate of Vital AF 1.2 @ 75 mL/hr. At goal rate, this regimen will provide 2160 kcal, 135 grams of protein, and 1460 mL free water.   Monitor magnesium, potassium, and phosphorus daily for at least 3 days, MD to replete as needed, as pt is at risk for refeeding syndrome given alcohol abuse and very poor nutrition intake x4-5 days PTA.   NUTRITION DIAGNOSIS:   Inadequate oral intake related to inability to eat as evidenced by NPO status.  GOAL:   Patient will meet greater than or equal to 90% of their needs  MONITOR:   Vent status, Weight trends, Labs, I & O's  REASON FOR ASSESSMENT:   Ventilator  ASSESSMENT:   26yo M peritnent PMH of daily alcohol, MDMA and marijuana use, frequent threats of self harm, psychiatric disease who is being admitted from ED after intentional amitryptiline OD.  Patient called his sister and stated he took a large quantity of pills which he found in the home which belong to his father.  Sister called mother who called EMS.  Police officer reports patient calm and cooperative on scene, states he vomited up pills he took and did not want to go to ed.  Enroute to ED he became less responsive and in ED RN reports initially responsive to pain and agitated leading to intubation for airway protection to allow for sedation.   Pt seen for new vent. BMI indicates overweight, borderline obesity. Pt was intubated in the ED and OGT placed at that time. Per PCCM NP note from this AM: consider TF if remains intubated in the AM. Mom and sister at bedside this AM and mom provides all information. She states that pt has been increasingly anxious, depressed, and suicidal in the past few months and that a trigger seems to be the death of his older sister which will have occurred 2 years ago next  month. Mom states that pt often drinks heavily throughout the day. He lives at home and mom tries to provide him with foods he used to like (such as pizza, sandwiches, and juice). For 4-5 days PTA pt would only drink large quantities of alcohol and a cup or two of juice but otherwise consumed no food or drinks. Mom states that episodes like this occur often.   Physical assessment shows no muscle or fat wasting at this time; mild edema to BLE. Per chart review, pt has gained 7 lbs since 08/30/16. Unable to state malnutrition based on ASPEN guidelines at this time.   Patient is currently intubated on ventilator support MV: 9.6 L/min Temp (24hrs), Avg:97.7 F (36.5 C), Min:96.9 F (36.1 C), Max:98.2 F (36.8 C) Propofol: none BP: 154/95 and MAP: 111  Medications reviewed; 2 g IV Mg sulfate x1 run today. Labs reviewed; BUN: <5 mg/dL, Ca: 7.1 mg/dL.  IVF: D5-150 mEq sodium bicarb @ 125 mL/hr (510 kcal from dextrose).   Diet Order:  Diet NPO time specified  Skin:  Reviewed, no issues  Last BM:  PTA/unknown  Height:   Ht Readings from Last 1 Encounters:  12/31/16 6' (1.829 m)    Weight:   Wt Readings from Last 1 Encounters:  12/31/16 217 lb 6 oz (98.6 kg)    Ideal Body Weight:  80.91 kg  BMI:  Body mass index is  29.48 kg/m.  Estimated Nutritional Needs:   Kcal:  2165  Protein:  118-128 grams (1.2-1.3 grams/kg)  Fluid:  >/= 2 L/day  EDUCATION NEEDS:   No education needs identified at this time    Trenton Gammon, MS, RD, LDN, CNSC Inpatient Clinical Dietitian Pager # [phone] After hours/weekend pager # [phone]

## 2016-12-31 NOTE — Progress Notes (Signed)
Pt. Transported to CT uneventfully to/from ED/Resuscitation-A, RT to monitor.

## 2016-12-31 NOTE — ED Notes (Addendum)
Dr.Laurence Critical Care at bedside

## 2016-12-31 NOTE — Procedures (Signed)
Extubation Procedure Note  Patient Details:   Name: Adam Novak DOB: 1991/01/07 MRN: [id number]   Airway Documentation:     Evaluation  O2 sats: stable throughout Complications: No apparent complications Patient did tolerate procedure well. Bilateral Breath Sounds: Clear, Diminished   Yes   Patient extubated to a 4L [place]. Cuff leak was heard prior to extubation. No stridor noted. RN and family at bedside with RT during extubation. Patient tolerating well at this time.  Danella Maiersshley M Cacey Willow 12/31/2016, 1:00 PM

## 2016-12-31 NOTE — Progress Notes (Signed)
Pt. Transported uneventfully from ED to 2W/ICU-1224, RT made aware.

## 2016-12-31 NOTE — Care Management Note (Signed)
Case Management Note  Patient Details  Name: Adam Novak MRN: [id number] Date of Birth: 18-Nov-1990  Subjective/Objective:      etoh and substance overdose              Action/Plan: Date:  December 31, 2016 Chart reviewed for concurrent status and case management needs. Will continue to follow patient progress. Discharge Planning: following for needs Expected discharge date: [id number] Marcelle SmilingRhonda Davis, BSN, [place]3, [place]   [phone]  Expected Discharge Date:                  Expected Discharge Plan:  Home/Self Care  In-House Referral:  Clinical Social Work  Discharge planning Services  CM Consult  Post Acute Care Choice:    Choice offered to:     DME Arranged:    DME Agency:     HH Arranged:    HH Agency:     Status of Service:  In process, will continue to follow  If discussed at Long Length of Stay Meetings, dates discussed:    Additional Comments:  Golda AcreDavis, Rhonda Lynn, RN 12/31/2016, 8:19 AM

## 2017-01-01 ENCOUNTER — Inpatient Hospital Stay (HOSPITAL_COMMUNITY): Payer: Self-pay

## 2017-01-01 DIAGNOSIS — F1290 Cannabis use, unspecified, uncomplicated: Secondary | ICD-10-CM

## 2017-01-01 DIAGNOSIS — F1994 Other psychoactive substance use, unspecified with psychoactive substance-induced mood disorder: Secondary | ICD-10-CM

## 2017-01-01 DIAGNOSIS — T43011A Poisoning by tricyclic antidepressants, accidental (unintentional), initial encounter: Secondary | ICD-10-CM

## 2017-01-01 DIAGNOSIS — T43012A Poisoning by tricyclic antidepressants, intentional self-harm, initial encounter: Principal | ICD-10-CM

## 2017-01-01 DIAGNOSIS — Z560 Unemployment, unspecified: Secondary | ICD-10-CM

## 2017-01-01 DIAGNOSIS — F3113 Bipolar disorder, current episode manic without psychotic features, severe: Secondary | ICD-10-CM

## 2017-01-01 DIAGNOSIS — F1390 Sedative, hypnotic, or anxiolytic use, unspecified, uncomplicated: Secondary | ICD-10-CM

## 2017-01-01 LAB — CBC
HCT: 39.5 % (ref 39.0–52.0)
Hemoglobin: 13.6 g/dL (ref 13.0–17.0)
MCH: 33.8 pg (ref 26.0–34.0)
MCHC: 34.4 g/dL (ref 30.0–36.0)
MCV: 98.3 fL (ref 78.0–100.0)
PLATELETS: 237 10*3/uL (ref 150–400)
RBC: 4.02 MIL/uL — ABNORMAL LOW (ref 4.22–5.81)
RDW: 12.9 % (ref 11.5–15.5)
WBC: 10.6 10*3/uL — ABNORMAL HIGH (ref 4.0–10.5)

## 2017-01-01 LAB — BASIC METABOLIC PANEL
Anion gap: 8 (ref 5–15)
BUN: 5 mg/dL — AB (ref 6–20)
CO2: 29 mmol/L (ref 22–32)
CREATININE: 0.77 mg/dL (ref 0.61–1.24)
Calcium: 7.7 mg/dL — ABNORMAL LOW (ref 8.9–10.3)
Chloride: 101 mmol/L (ref 101–111)
GFR calc Af Amer: >60 mL/min (ref >60)
GLUCOSE: 93 mg/dL (ref 65–99)
Potassium: 3 mmol/L — ABNORMAL LOW (ref 3.5–5.1)
SODIUM: 138 mmol/L (ref 135–145)

## 2017-01-01 LAB — PHOSPHORUS: Phosphorus: 3.4 mg/dL (ref 2.5–4.6)

## 2017-01-01 LAB — HIV ANTIBODY (ROUTINE TESTING W REFLEX): HIV SCREEN 4TH GENERATION: NONREACTIVE

## 2017-01-01 LAB — MAGNESIUM: MAGNESIUM: 1.8 mg/dL (ref 1.7–2.4)

## 2017-01-01 MED ORDER — POTASSIUM CHLORIDE CRYS ER 20 MEQ PO TBCR
40.0000 meq | EXTENDED_RELEASE_TABLET | Freq: Once | ORAL | Status: AC
  Administered 2017-01-01: 40 meq via ORAL
  Filled 2017-01-01: qty 2

## 2017-01-01 MED ORDER — MAGNESIUM SULFATE 2 GM/50ML IV SOLN
2.0000 g | Freq: Once | INTRAVENOUS | Status: AC
Start: 2017-01-01 — End: 2017-01-01
  Administered 2017-01-01: 2 g via INTRAVENOUS
  Filled 2017-01-01: qty 50
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
## 2017-01-01 NOTE — Progress Notes (Signed)
PULMONARY / CRITICAL CARE MEDICINE   Name: Adam Novak MRN: [id number] DOB: 01/17/1991    ADMISSION DATE:  12/30/2016 CONSULTATION DATE:  12/31/16  REFERRING MD:  ED  CHIEF COMPLAINT:  Overdose  BRIEF SUMMARY:   26 y/o M with PMH of daily alcohol, MDMA and marijuana use, frequent threats of self harm, psychiatric disease (prior reported dx of bipolar disorder) admitted 7/22 with intentional amitriptyline overdose.  On scene, he reportedly vomited up pills and did not want to go to the ER. En route, he became less responsive and was intubated in the ER for airway protection.  EKG initially normal QRS / ST.  Labs notable for AGMA, elevated ETOH.  UDS + for benzo's & THC.       SUBJECTIVE:  IVC paperwork initiated.  No acute events overnight.  Pt reports he didn't sleep overnight.  States he learned his lesson > felt as if he were suffocating on the ventilator.     VITAL SIGNS: BP (!) 138/92   Pulse 79   Temp 98.4 F (36.9 C) (Oral)   Resp 17   Ht 6' (1.829 m)   Wt 217 lb 6 oz (98.6 kg)   SpO2 95%   BMI 29.48 kg/m   HEMODYNAMICS:    VENTILATOR SETTINGS: Vent Mode: PSV;CPAP FiO2 (%):  [40 %] 40 % Set Rate:  [18 bmp] 18 bmp Vt Set:  [540 mL] 540 mL PEEP:  [5 cmH20] 5 cmH20 Pressure Support:  [5 cmH20] 5 cmH20 Plateau Pressure:  [9 cmH20] 9 cmH20  INTAKE / OUTPUT: I/O last 3 completed shifts: In: 6094.3 [P.O.:1320; I.V.:4654.3; NG/GT:70; IV Piggyback:50] Out: 4400 [Urine:4350; Emesis/NG output:50]  PHYSICAL EXAMINATION: General: well developed adult male in NAD, lying in bed  HEENT: MM pink/moist, no jvd, wild / unkempt hair PSY: calm/cooperative, avoids any discussion regarding why he attempted suicide Neuro: AAOx4, speech clear, MAE CV: s1s2 rrr, no m/r/g PULM: even/non-labored, lungs bilaterally clear  WU:JWJXGI:soft, non-tender, bsx4 active  Extremities: warm/dry, no edema  Skin: no rashes or lesions  LABS:  BMET  Recent Labs Lab 12/31/16 0325  12/31/16 1314 01/01/17 0301  NA 140 139 138  K 4.1 3.4* 3.0*  CL 108 102 101  CO2 25 28 29   BUN <5* <5* 5*  CREATININE 0.78 0.83 0.77  GLUCOSE 108* 106* 93    Electrolytes  Recent Labs Lab 12/31/16 0325 12/31/16 1314 01/01/17 0301  CALCIUM 7.1* 7.3* 7.7*  MG 1.7  1.7  --  1.8  PHOS 2.8  2.8  --  3.4    CBC  Recent Labs Lab 12/30/16 2150 12/31/16 0325 01/01/17 0301  WBC 7.6 9.5 10.6*  HGB 15.9 13.7 13.6  HCT 43.5 39.5 39.5  PLT 248 216 237    Coag's  Recent Labs Lab 12/30/16 2105  INR 0.97    Sepsis Markers No results for input(s): LATICACIDVEN, PROCALCITON, O2SATVEN in the last 168 hours.  ABG  Recent Labs Lab 12/30/16 2300 12/31/16 0413  PHART 7.388 7.438  PCO2ART 41.8 41.0  PO2ART 365* 122*    Liver Enzymes  Recent Labs Lab 12/30/16 2150  AST 49*  ALT 62  ALKPHOS 78  BILITOT 0.9  ALBUMIN 4.3    Cardiac Enzymes No results for input(s): TROPONINI, PROBNP in the last 168 hours.  Glucose  Recent Labs Lab 12/30/16 2158  GLUCAP 105*    Imaging Dg Chest Port 1 View  Result Date: 01/01/2017 CLINICAL DATA:  Acute respiratory failure with hypoxia. EXAM: PORTABLE CHEST  1 VIEW COMPARISON:  Radiographs of December 30, 2016. FINDINGS: The heart size and mediastinal contours are within normal limits. Endotracheal and nasogastric tubes have been removed. No pneumothorax or pleural effusion is noted. Minimal bibasilar subsegmental atelectasis is noted. The visualized skeletal structures are unremarkable. IMPRESSION: Minimal bibasilar subsegmental atelectasis. Endotracheal and nasogastric tubes have been removed. Electronically Signed   By: Lupita Raider, M.D.   On: 01/01/2017 07:01     STUDIES:  CT Head 7/22 >> normal  HIV 7/22 >> negative  CULTURES: MRSA PCR 7/23 >> positive  ANTIBIOTICS:   SIGNIFICANT EVENTS: 7/22  Admit with OD 7/23  Extubated   LINES/TUBES: ET 7/22 >> 7/23  DISCUSSION: 26 y/o M admitted with TCA OD  w/evidence severe cardiac toxicity (QRS >135ms, R wave in aVR). HPI supports CNS toxicity.  Extubated 7/23 without difficulty.  IVC papers signed.   ASSESSMENT / PLAN:  PULMONARY A: Intubated for airway protection P:   Pulmonary hygiene Follow intermittent CXR  Mobilize / OOB  CARDIOVASCULAR A:  TCA Cardiac toxicity P:  ICU monitoring  Reduce EKG to Q12x2 more > QTc 490, QRS Discontinue bicarbonate gtt after current liter infused  RENAL A:   TCA OD P:   HCO3 gtt to complete after current bag Trend BMP / urinary output Replace electrolytes as indicated Avoid nephrotoxic agents, ensure adequate renal perfusion  GASTROINTESTINAL A:   GI PPX P:   Discontinue pepcid Diet as tolerated  HEMATOLOGIC A:   DVT PPX P:  Trend CBC  Lovenox for DVT prophylaxis  INFECTIOUS A:   No indication of acute infectious process  P:   Monitor fever curve / WBC trend  ENDOCRINE A:   N/A   P:   Trend glucose on BMP   NEUROLOGIC / PSY A:   TCA CNS toxicity Intentional OD Reported Hx of Bipolar Disorder, multiple suicide attempts P:   Minimize all sedating medications  Hold home depakote, neurontin, atarax, trazodone until seen by PSY > ? If he is actually taking any of these medications PSY consulted for evaluation  IVC paperwork placed > strongly feel the patient should be admitted for inpatient psychiatric care  SW following   FAMILY  - Updates:  Patient updated at bedside am 7/24.   - Inter-disciplinary family meet or Palliative Care meeting due by:  day 7  - Global:  To telemetry floor, TRH as of 7/25 am  Canary Brim, NP-C [facility] Pgr: [phone] or if no answer [phone] 01/01/2017, 8:03 AM

## 2017-01-01 NOTE — Clinical Social Work Note (Signed)
Clinical Social Work Assessment  Patient Details  Name: Adam Novak MRN: [id number] Date of Birth: [birth year]  Date of referral:  01/01/17               Reason for consult:  Substance Use/ETOH Abuse, Mental Health Concerns, Suicide Risk/Attempt                Permission sought to share information with:  Family Supports Permission granted to share information::  Yes, Verbal Permission Granted  Name::          [place]::     Relationship:: Father  Contact Information:    [phone]  Housing/Transportation Living arrangements for the past 2 months:  [place] of Information:  Patient Patient Interpreter Needed:  None Criminal Activity/Legal Involvement Pertinent to Current Situation/Hospitalization:  No - Comment as needed Significant Relationships:  Parents Lives with:  Parents Do you feel safe going back to the place where you live?  Yes Need for family participation in patient care:  No (Coment)  Care giving concerns:  Intentional Overdose   IVC'ed. -History of [facility] admissions. Frequent threats of self-harm -Polysubstance use.   Social Worker assessment / plan:  CSW and psychiatrist met with patient at bedside, pt.alert, oriented. Patient became agitated due to several medical staff interrupting patient sleep and asking too questions.Patient stated, " I am sorry for getting upset, it my Bipolar." Patient reports he took his fathers medications while he had been drinking. Patient reports, "I took a bunch of my dads sleeping medications." Patient did not elaborate why he took the medications. Patient reports he has not been able to find a job and feels he has a drinking problem. Patient reports he went to [facility] recently and was prescribed medications for Bipolar and to help with his drinking.Patient has not been able to afford his medications. Patient reports he has to attend a mandatory DWI  assessment class for alcohol use, not sure  of date. Patient gave csw permission to keep family updated about discharge plans.   Plan: Assist with discharge to inpatient psychiatric hospital when medically stable.   Employment status:  Unemployed Forensic scientist:  Self Pay (Medicaid Pending) PT Recommendations:  Not assessed at this time Information / Referral to community resources:  Inpatient Psychiatric Care (Comment Required)  Patient/Family's Response to care: Agreeable but reports feeling aggitated with medical staff coming in his room bothering him while he trying to sleep.   Patient/Family's Understanding of and Emotional Response to Diagnosis, Current Treatment, and Prognosis: No family at bedside, patient gave csw permission to gather collateral information. Patient minimizes intentional overdose.  Emotional Assessment Appearance:  Developmentally appropriate Attitude/Demeanor/Rapport:    Affect (typically observed):  Accepting, Agitated Orientation:  Oriented to Self, Oriented to Place, Oriented to  Time, Oriented to Situation Alcohol / Substance use:  Illicit Drugs, Alcohol use Psych involvement (Current and /or in the community):  Yes, intentional Overdose. Evaluation completed. Recommends Inpatient psychiatric placement.   Discharge Needs   Concerns to be addressed:  Discharge Planning Concerns, Care Coordination Readmission within the last 30 days:  Yes Current discharge risk:  Dependent with Mobility Barriers to Discharge:  Continued Medical Work up, Active Substance Use   Lia Hopping, LCSW 01/01/2017, 2:11 PM

## 2017-01-01 NOTE — Progress Notes (Signed)
Nutrition Follow-up  DOCUMENTATION CODES:   Not applicable  INTERVENTION:  - Continue to encourage PO intakes of meals and beverages. - RD will continue to monitor for additional needs.  NUTRITION DIAGNOSIS:   Inadequate oral intake related to social / environmental circumstances as evidenced by per patient/family report. -revised  GOAL:   Patient will meet greater than or equal to 90% of their needs -beginning to meet  MONITOR:   PO intake, Weight trends, Labs  ASSESSMENT:   26yo M peritnent PMH of daily alcohol, MDMA and marijuana use, frequent threats of self harm, psychiatric disease who is being admitted from ED after intentional amitryptiline OD.  Patient called his sister and stated he took a large quantity of pills which he found in the home which belong to his father.  Sister called mother who called EMS.  Police officer reports patient calm and cooperative on scene, states he vomited up pills he took and did not want to go to ed.  Enroute to ED he became less responsive and in ED RN reports initially responsive to pain and agitated leading to intubation for airway protection to allow for sedation.   7/24 Pt was extubated at ~12:55 PM yesterday and OGT removed at that time. Weight remains stable from yesterday. Estimated nutrition needs updated based on extubation. Diet advanced to Regular yesterday shortly before dinner time and pt consumed 100% of dinner (hamburger, fries, 8 oz ginger ale, and [place] ice: 850 kcal and 30 grams of protein). Per PCCM NP note this AM, plan to d/c bicarb after current liter. Psych has been consulted. Pt being transferred to another floor at this time; RD unable to talk with pt.   Medications reviewed; 1 mg IV folic acid/day, 2 g IV Mg sulfate x1 run today, 40 mEq oral KCl x1 dose today, 100 mg IV thiamine/day.  Labs reviewed; K: 3 mmol/L, BUN: 5 mg/dL, Ca: 7.7 mg/dL.   Current IVF: D5-150 mEq sodium bicarb @ 125 mL/hr (510 kcal from  dextrose).   7/23 - Pt was intubated in the ED and OGT placed at that time.  - Per PCCM NP note from this AM: consider TF if remains intubated in the AM.  - Mom and sister at bedside this AM and mom provides all information.  - Pt has been increasingly anxious, depressed, and suicidal in the past few months and that a trigger seems to be the death of his older sister which will have occurred 2 years ago next month.  - Pt often drinks heavily throughout the day.  - He lives at home and mom tries to provide him with foods he used to like (such as pizza, sandwiches, and juice).  - For 4-5 days PTA pt would only drink large quantities of alcohol and a cup or two of juice but otherwise consumed no food or drinks.  - Mom states that episodes like this occur often.  - Physical assessment shows no muscle or fat wasting at this time; mild edema to BLE.  - Per chart review, pt has gained 7 lbs since 08/30/16. Unable to state malnutrition based on ASPEN guidelines at this time.   Patient is currently intubated on ventilator support MV: 9.6 L/min Temp (24hrs), Avg:97.7 F (36.5 C), Min:96.9 F (36.1 C), Max:98.2 F (36.8 C) Propofol: none BP: 154/95 and MAP: 111 IVF: D5-150 mEq sodium bicarb @ 125 mL/hr (510 kcal from dextrose).   Diet Order:  Diet regular Room service appropriate? Yes; Fluid consistency: Thin  Skin:  Reviewed, no issues  Last BM:  PTA/unknown  Height:   Ht Readings from Last 1 Encounters:  12/31/16 6' (1.829 m)    Weight:   Wt Readings from Last 1 Encounters:  01/01/17 217 lb 6 oz (98.6 kg)    Ideal Body Weight:  80.91 kg  BMI:  Body mass index is 29.48 kg/m.  Estimated Nutritional Needs:   Kcal:  [id number] (22-24 kcal/kg)  Protein:  118-128 grams (1.2-1.3 grams/kg)  Fluid:  >/= 2 L/day  EDUCATION NEEDS:   No education needs identified at this time    Trenton GammonJessica Akacia Boltz, MS, RD, LDN, CNSC Inpatient Clinical Dietitian Pager # [phone] After  hours/weekend pager # [phone]

## 2017-01-02 ENCOUNTER — Encounter (HOSPITAL_COMMUNITY): Payer: Self-pay

## 2017-01-02 ENCOUNTER — Inpatient Hospital Stay (HOSPITAL_COMMUNITY)
Admission: AD | Admit: 2017-01-02 | Discharge: 2017-01-07 | DRG: 885 | Disposition: A | Discharge status: Home / Self Care | Payer: Federal, State, Local not specified - Other | Attending: Psychiatry | Admitting: Psychiatry

## 2017-01-02 DIAGNOSIS — F410 Panic disorder [episodic paroxysmal anxiety] without agoraphobia: Secondary | ICD-10-CM | POA: Diagnosis present

## 2017-01-02 DIAGNOSIS — F319 Bipolar disorder, unspecified: Secondary | ICD-10-CM | POA: Diagnosis not present

## 2017-01-02 DIAGNOSIS — E785 Hyperlipidemia, unspecified: Secondary | ICD-10-CM | POA: Diagnosis present

## 2017-01-02 DIAGNOSIS — F1490 Cocaine use, unspecified, uncomplicated: Secondary | ICD-10-CM | POA: Diagnosis not present

## 2017-01-02 DIAGNOSIS — F3160 Bipolar disorder, current episode mixed, unspecified: Principal | ICD-10-CM | POA: Diagnosis present

## 2017-01-02 DIAGNOSIS — F3181 Bipolar II disorder: Secondary | ICD-10-CM

## 2017-01-02 DIAGNOSIS — F1290 Cannabis use, unspecified, uncomplicated: Secondary | ICD-10-CM | POA: Diagnosis not present

## 2017-01-02 DIAGNOSIS — G4700 Insomnia, unspecified: Secondary | ICD-10-CM | POA: Diagnosis present

## 2017-01-02 DIAGNOSIS — T1491XA Suicide attempt, initial encounter: Secondary | ICD-10-CM

## 2017-01-02 DIAGNOSIS — T43012A Poisoning by tricyclic antidepressants, intentional self-harm, initial encounter: Secondary | ICD-10-CM | POA: Diagnosis present

## 2017-01-02 DIAGNOSIS — F1010 Alcohol abuse, uncomplicated: Secondary | ICD-10-CM

## 2017-01-02 DIAGNOSIS — F17210 Nicotine dependence, cigarettes, uncomplicated: Secondary | ICD-10-CM | POA: Diagnosis present

## 2017-01-02 DIAGNOSIS — T43012D Poisoning by tricyclic antidepressants, intentional self-harm, subsequent encounter: Secondary | ICD-10-CM

## 2017-01-02 DIAGNOSIS — Z79899 Other long term (current) drug therapy: Secondary | ICD-10-CM

## 2017-01-02 LAB — CBC
HCT: 39.6 % (ref 39.0–52.0)
Hemoglobin: 14 g/dL (ref 13.0–17.0)
MCH: 33.9 pg (ref 26.0–34.0)
MCHC: 35.4 g/dL (ref 30.0–36.0)
MCV: 95.9 fL (ref 78.0–100.0)
Platelets: 241 10*3/uL (ref 150–400)
RBC: 4.13 MIL/uL — ABNORMAL LOW (ref 4.22–5.81)
RDW: 12.9 % (ref 11.5–15.5)
WBC: 7.9 10*3/uL (ref 4.0–10.5)

## 2017-01-02 LAB — BASIC METABOLIC PANEL
Anion gap: 8 (ref 5–15)
BUN: 6 mg/dL (ref 6–20)
CALCIUM: 8.4 mg/dL — AB (ref 8.9–10.3)
CO2: 27 mmol/L (ref 22–32)
CREATININE: 0.96 mg/dL (ref 0.61–1.24)
Chloride: 104 mmol/L (ref 101–111)
GFR calc Af Amer: >60 mL/min (ref >60)
Glucose, Bld: 94 mg/dL (ref 65–99)
Potassium: 3.8 mmol/L (ref 3.5–5.1)
SODIUM: 139 mmol/L (ref 135–145)

## 2017-01-02 LAB — MAGNESIUM: MAGNESIUM: 2.1 mg/dL (ref 1.7–2.4)

## 2017-01-02 MED ORDER — DIVALPROEX SODIUM ER 500 MG PO TB24
1000.0000 mg | ORAL_TABLET | Freq: Every day | ORAL | Status: DC
  Administered 2017-01-02 – 2017-01-06 (×5): 1000 mg via ORAL
  Filled 2017-01-02 (×4): qty 2
  Filled 2017-01-02: qty 14
  Filled 2017-01-02 (×2): qty 2

## 2017-01-02 MED ORDER — LORAZEPAM 1 MG PO TABS: 1.0000 mg | ORAL_TABLET | Freq: Two times a day (BID) | ORAL | Status: DC

## 2017-01-02 MED ORDER — CHLORDIAZEPOXIDE HCL 25 MG PO CAPS
25.0000 mg | ORAL_CAPSULE | ORAL | Status: AC
  Administered 2017-01-04 (×2): 25 mg via ORAL
  Filled 2017-01-02: qty 1

## 2017-01-02 MED ORDER — PHENOL 1.4 % MT LIQD
1.0000 | OROMUCOSAL | Status: DC | PRN
  Filled 2017-01-02: qty 177

## 2017-01-02 MED ORDER — CHLORDIAZEPOXIDE HCL 25 MG PO CAPS
25.0000 mg | ORAL_CAPSULE | Freq: Four times a day (QID) | ORAL | Status: AC | PRN
Start: 2017-01-02 — End: 2017-01-05

## 2017-01-02 MED ORDER — HYDROXYZINE HCL 25 MG PO TABS
25.0000 mg | ORAL_TABLET | Freq: Four times a day (QID) | ORAL | Status: AC | PRN
  Administered 2017-01-04: 25 mg via ORAL
  Filled 2017-01-02: qty 1

## 2017-01-02 MED ORDER — MENTHOL 3 MG MT LOZG
1.0000 | LOZENGE | OROMUCOSAL | Status: DC | PRN
Start: 2017-01-02 — End: 2017-01-07

## 2017-01-02 MED ORDER — LOPERAMIDE HCL 2 MG PO CAPS: 2.0000 mg | ORAL_CAPSULE | ORAL | Status: DC | PRN

## 2017-01-02 MED ORDER — VITAMIN B-1 100 MG PO TABS
100.0000 mg | ORAL_TABLET | Freq: Every day | ORAL | Status: DC
  Administered 2017-01-03 – 2017-01-07 (×5): 100 mg via ORAL
  Filled 2017-01-02: qty 7
  Filled 2017-01-02 (×6): qty 1

## 2017-01-02 MED ORDER — CHLORDIAZEPOXIDE HCL 25 MG PO CAPS: 25.0000 mg | ORAL_CAPSULE | Freq: Every day | ORAL | Status: AC

## 2017-01-02 MED ORDER — THIAMINE HCL 100 MG/ML IJ SOLN: 100.0000 mg | Freq: Once | INTRAMUSCULAR | Status: DC

## 2017-01-02 MED ORDER — LOPERAMIDE HCL 2 MG PO CAPS: 2.0000 mg | ORAL_CAPSULE | ORAL | Status: AC | PRN

## 2017-01-02 MED ORDER — GABAPENTIN 300 MG PO CAPS
300.0000 mg | ORAL_CAPSULE | Freq: Three times a day (TID) | ORAL | Status: DC
  Administered 2017-01-02 – 2017-01-07 (×15): 300 mg via ORAL
  Filled 2017-01-02 (×2): qty 21
  Filled 2017-01-02 (×9): qty 1
  Filled 2017-01-02: qty 21
  Filled 2017-01-02 (×9): qty 1

## 2017-01-02 MED ORDER — CHLORDIAZEPOXIDE HCL 25 MG PO CAPS
25.0000 mg | ORAL_CAPSULE | Freq: Four times a day (QID) | ORAL | Status: AC
  Administered 2017-01-02 – 2017-01-03 (×4): 25 mg via ORAL
  Filled 2017-01-02 (×2): qty 1

## 2017-01-02 MED ORDER — VITAMIN B-1 100 MG PO TABS: 100.0000 mg | ORAL_TABLET | Freq: Every day | ORAL | Status: DC

## 2017-01-02 MED ORDER — MAGNESIUM HYDROXIDE 400 MG/5ML PO SUSP: 30.0000 mL | Freq: Every day | ORAL | Status: DC | PRN

## 2017-01-02 MED ORDER — PHENOL 1.4 % MT LIQD: 1.0000 | OROMUCOSAL | 0 refills | Status: DC | PRN

## 2017-01-02 MED ORDER — HYDROXYZINE HCL 25 MG PO TABS: 25.0000 mg | ORAL_TABLET | Freq: Four times a day (QID) | ORAL | Status: DC | PRN

## 2017-01-02 MED ORDER — ALUM & MAG HYDROXIDE-SIMETH 200-200-20 MG/5ML PO SUSP: 30.0000 mL | ORAL | Status: DC | PRN

## 2017-01-02 MED ORDER — BENZTROPINE MESYLATE 1 MG PO TABS
1.0000 mg | ORAL_TABLET | Freq: Four times a day (QID) | ORAL | Status: DC | PRN
  Administered 2017-01-05 – 2017-01-06 (×2): 1 mg via ORAL
  Filled 2017-01-02 (×2): qty 1

## 2017-01-02 MED ORDER — ADULT MULTIVITAMIN W/MINERALS CH
1.0000 | ORAL_TABLET | Freq: Every day | ORAL | Status: DC
  Filled 2017-01-02 (×2): qty 1

## 2017-01-02 MED ORDER — FAMOTIDINE 20 MG PO TABS: 20.0000 mg | ORAL_TABLET | Freq: Every day | ORAL | Status: DC

## 2017-01-02 MED ORDER — LORAZEPAM 1 MG PO TABS: 1.0000 mg | ORAL_TABLET | Freq: Every day | ORAL | Status: DC

## 2017-01-02 MED ORDER — HALOPERIDOL 5 MG PO TABS
5.0000 mg | ORAL_TABLET | Freq: Four times a day (QID) | ORAL | Status: DC | PRN
  Administered 2017-01-05 – 2017-01-06 (×2): 5 mg via ORAL
  Filled 2017-01-02 (×2): qty 1

## 2017-01-02 MED ORDER — LORAZEPAM 1 MG PO TABS: 1.0000 mg | ORAL_TABLET | Freq: Four times a day (QID) | ORAL | Status: DC | PRN

## 2017-01-02 MED ORDER — ADULT MULTIVITAMIN W/MINERALS CH
1.0000 | ORAL_TABLET | Freq: Every day | ORAL | Status: DC
  Administered 2017-01-02 – 2017-01-07 (×6): 1 via ORAL
  Filled 2017-01-02 (×2): qty 1
  Filled 2017-01-02: qty 7
  Filled 2017-01-02 (×6): qty 1

## 2017-01-02 MED ORDER — TRAZODONE HCL 100 MG PO TABS
100.0000 mg | ORAL_TABLET | Freq: Every day | ORAL | Status: DC
  Administered 2017-01-02 – 2017-01-06 (×5): 100 mg via ORAL
  Filled 2017-01-02 (×3): qty 1
  Filled 2017-01-02: qty 7
  Filled 2017-01-02 (×4): qty 1

## 2017-01-02 MED ORDER — FAMOTIDINE 20 MG PO TABS
20.0000 mg | ORAL_TABLET | Freq: Every day | ORAL | Status: DC
  Administered 2017-01-02: 20 mg via ORAL
  Filled 2017-01-02: qty 1

## 2017-01-02 MED ORDER — FOLIC ACID 1 MG PO TABS: 1.0000 mg | ORAL_TABLET | Freq: Every day | ORAL | 3 refills | Status: AC

## 2017-01-02 MED ORDER — LORAZEPAM 1 MG PO TABS: 1.0000 mg | ORAL_TABLET | Freq: Four times a day (QID) | ORAL | Status: DC

## 2017-01-02 MED ORDER — LORAZEPAM 1 MG PO TABS
1.0000 mg | ORAL_TABLET | Freq: Three times a day (TID) | ORAL | Status: DC
Start: 2017-01-03 — End: 2017-01-02

## 2017-01-02 MED ORDER — VITAMIN B-1 100 MG PO TABS
100.0000 mg | ORAL_TABLET | Freq: Every day | ORAL | Status: DC
  Filled 2017-01-02: qty 1

## 2017-01-02 MED ORDER — CHLORDIAZEPOXIDE HCL 25 MG PO CAPS
25.0000 mg | ORAL_CAPSULE | Freq: Three times a day (TID) | ORAL | Status: AC
  Administered 2017-01-03 – 2017-01-04 (×2): 25 mg via ORAL
  Filled 2017-01-02 (×5): qty 1

## 2017-01-02 MED ORDER — ACETAMINOPHEN 325 MG PO TABS: 650.0000 mg | ORAL_TABLET | Freq: Four times a day (QID) | ORAL | Status: DC | PRN

## 2017-01-02 MED ORDER — NALTREXONE HCL 50 MG PO TABS
50.0000 mg | ORAL_TABLET | Freq: Every day | ORAL | Status: DC
  Administered 2017-01-02 – 2017-01-07 (×6): 50 mg via ORAL
  Filled 2017-01-02: qty 1
  Filled 2017-01-02: qty 7
  Filled 2017-01-02 (×7): qty 1

## 2017-01-02 MED ORDER — ONDANSETRON 4 MG PO TBDP: 4.0000 mg | ORAL_TABLET | Freq: Four times a day (QID) | ORAL | Status: DC | PRN

## 2017-01-02 MED ORDER — ONDANSETRON 4 MG PO TBDP: 4.0000 mg | ORAL_TABLET | Freq: Four times a day (QID) | ORAL | Status: AC | PRN

## 2017-01-02 NOTE — Progress Notes (Signed)
[facility] gave [facility]. Awaiting for transport

## 2017-01-02 NOTE — Progress Notes (Signed)
Taking over care of patient. Received report from [place] RN. Assessment is unchanged from previous documentation

## 2017-01-02 NOTE — Progress Notes (Signed)
[facility]  Accepting physician: Dr. Melbourne AbtsJonnalagagada Attending Dr. Jama Flavorsobos Nurse Please call report to [phone] before pt. Discharges. Patient under IVC.[facility] between 12:30pm-1:00pm Nurse and physician informed.     Vivi BarrackNicole Vita Currin, Theresia MajorsLCSWA, MSW Clinical Social Worker 5E and Psychiatric Service Line [phone] 01/02/2017  11:11 AM
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
## 2017-01-02 NOTE — Progress Notes (Signed)
Nutrition Follow-up  DOCUMENTATION CODES:   Not applicable  INTERVENTION:   Double protein with all meals  NUTRITION DIAGNOSIS:   Inadequate oral intake related to social / environmental circumstances as evidenced by per patient/family report. -revised  GOAL:   Patient will meet greater than or equal to 90% of their needs   Meeting  MONITOR:   PO intake, Weight trends, Labs  ASSESSMENT:    26 years old male with alcohol intoxication, polysubstance abuse presented with status post intentional overdose and toxic encephalopathy required intubation on admission. Patient was placed on involuntary commitment.  Pt continue to do well; eating 100% of meals. No new weight since admit. RD will order double protein with all meals to help pt meet estimated protein needs.   Medications reviewed and include: lovenox, folic acid, thiamine   Labs reviewed: Ca 8.4(L)  Diet Order:  Diet regular Room service appropriate? Yes; Fluid consistency: Thin  Skin:  Reviewed, no issues  Last BM:  7/24  Height:   Ht Readings from Last 1 Encounters:  12/31/16 6' (1.829 m)    Weight:   Wt Readings from Last 1 Encounters:  01/02/17 217 lb 6 oz (98.6 kg)    Ideal Body Weight:  80.91 kg  BMI:  Body mass index is 29.48 kg/m.  Estimated Nutritional Needs:   Kcal:  [id number] (22-24 kcal/kg)  Protein:  118-128 grams (1.2-1.3 grams/kg)  Fluid:  >/= 2 L/day  EDUCATION NEEDS:   No education needs identified at this time  Betsey Holidayasey Amyriah Buras MS, RD, LDN Pager #- [phone] After Hours Pager: [phone]

## 2017-01-02 NOTE — Progress Notes (Signed)
[facility] called for transport. No ETA given.   Vivi BarrackNicole Nayeli Calvert, Theresia MajorsLCSWA, MSW Clinical Social Worker 5E and Psychiatric Service Line [phone] 01/02/2017  12:31 PM

## 2017-01-02 NOTE — Tx Team (Signed)
Initial Treatment Plan 01/02/2017 2:32 PM Adam Novak IEP:[id number]    PATIENT STRESSORS: Financial difficulties Legal issue Medication change or noncompliance Occupational concerns Substance abuse   PATIENT STRENGTHS: Wellsite geologistCommunication skills General fund of knowledge Physical Health   PATIENT IDENTIFIED PROBLEMS: Depression  Suicidal ideation/attempt  Substance abuse  Anxiety`  "I would like to have help with my anxiety in public"  "I would like to not be suicidal"           DISCHARGE CRITERIA:  Improved stabilization in mood, thinking, and/or behavior Motivation to continue treatment in a less acute level of care Verbal commitment to aftercare and medication compliance Withdrawal symptoms are absent or subacute and managed without 24-hour nursing intervention  PRELIMINARY DISCHARGE PLAN: Outpatient therapy Medication management  PATIENT/FAMILY INVOLVEMENT: This treatment plan has been presented to and reviewed with the patient, Adam Novak.  The patient and family have been given the opportunity to ask questions and make suggestions.  Levin BaconHeather V Nancy Arvin, RN 01/02/2017, 2:32 PM

## 2017-01-02 NOTE — Discharge Summary (Addendum)
Physician Discharge Summary  Adam Novak  ZOX:[id number]  DOB: [birth year]  DOA: 12/30/2016 PCP: Patient, No Pcp Per  Admit date: 12/30/2016 Discharge date: 01/02/2017  Admitted From: Home  Disposition:  [facility]   Recommendations for Outpatient Follow-up:  1. Please obtain BMP/CBC in one week 2. Check EKG in 24hr to monitor QT interval    Discharge Condition: Stable  CODE STATUS: FULL  Diet recommendation: Regular   Brief/Interim Summary: 26 -year-old male with past medical history of daily alcohol MDMA and marijuana use, bipolar disorder with primary threats of self-harm, was admitted on July 22 within functional amitriptyline overdose. Apparently patient reportedly vomited up pills and did not want to come to the ER. Upon EMS arrival patient was lethargic and on route patient became less responsive, lipoma right upper to the ED he was intubated for airway protection. EKG initially normal QRS/ST. Patient was admitted to the ICU and for further monitoring. Labs were notable for anion gap metabolic acidosis, elevated alcohol levels and UDS positive for benzos and THC. He was treated with bicarbonate drip tablet acidosis was resolved. Patient was successfully extubated on 12/31/16. Psychiatry was consulted and deemed that patient needs inpatient psychiatric hospitalization. Patient was monitored for 24 hours with telemetry monitors and daily EKG QT interval has been improving and is now stable. Patient is medically cleared for discharge to behavioral health medicine.   Subjective: Patient seen and examined on the day of discharge, he has no complaints. He is in agreement to go to psych facility. No hallucinations, tremors or suicidal thoughts at this moment.    Discharge Diagnoses/Hospital Course:  Suicide attempt - intentional  Inpatient psychiatry admission for further treatment Defer treatment to psychiatrist.  Amitriptyline overdose Was treated with bicarbonate drip QTC was monitored  and stable Discontinue amitriptyline  Acute metabolic encephalopathy Secondary to overdose Resolved  Chronic alcohol abuse No signs of withdrawal Continue thiamine and folic acid Counseled on abstinence.  Bipolar disorder Psychiatrist recommending behavioral health Hospital admission Restart home medications per psychiatry Trazodone 100 Mg at bedtime, Naltrexone 50 mg daily, gabapentin 300 mg 3 times a day and Depakote ER 1000 mg at bedtime. Avoid amitriptyline.  .  On the day of the discharge the patient's vitals were stable, and no other acute medical condition were reported by patient. the patient was felt safe to be discharge to home  Discharge Instructions  You were cared for by a hospitalist during your hospital stay. If you have any questions about your discharge medications or the care you received while you were in the hospital after you are discharged, you can call the unit and asked to speak with the hospitalist on call if the hospitalist that took care of you is not available. Once you are discharged, your primary care physician will handle any further medical issues. Please note that NO REFILLS for any discharge medications will be authorized once you are discharged, as it is imperative that you return to your primary care physician (or establish a relationship with a primary care physician if you do not have one) for your aftercare needs so that they can reassess your need for medications and monitor your lab values.  Discharge Instructions    Call MD for:  difficulty breathing, headache or visual disturbances    Complete by:  As directed    Call MD for:  extreme fatigue    Complete by:  As directed    Call MD for:  hives    Complete by:  As  directed    Call MD for:  persistant dizziness or light-headedness    Complete by:  As directed    Call MD for:  persistant nausea and vomiting    Complete by:  As directed    Call MD for:  redness, tenderness, or signs of infection  (pain, swelling, redness, odor or green/yellow discharge around incision site)    Complete by:  As directed    Call MD for:  severe uncontrolled pain    Complete by:  As directed    Call MD for:  temperature >100.4    Complete by:  As directed    Diet general    Complete by:  As directed    Increase activity slowly    Complete by:  As directed      Allergies as of 01/02/2017   No Known Allergies     Medication List    STOP taking these medications   amitriptyline 75 MG tablet Commonly known as:  ELAVIL   hydrOXYzine 50 MG tablet Commonly known as:  ATARAX/VISTARIL     TAKE these medications   divalproex 500 MG 24 hr tablet Commonly known as:  DEPAKOTE ER Take 2 tablets (1,000 mg total) by mouth at bedtime.   famotidine 20 MG tablet Commonly known as:  PEPCID Take 1 tablet (20 mg total) by mouth daily.   folic acid 1 MG tablet Commonly known as:  FOLVITE Take 1 tablet (1 mg total) by mouth daily.   gabapentin 300 MG capsule Commonly known as:  NEURONTIN Take 1 capsule (300 mg total) by mouth 3 (three) times daily.   multivitamin with minerals Tabs tablet Take 1 tablet by mouth daily.   naltrexone 50 MG tablet Commonly known as:  DEPADE Take 1 tablet (50 mg total) by mouth daily.   phenol 1.4 % Liqd Commonly known as:  CHLORASEPTIC Use as directed 1 spray in the mouth or throat as needed for throat irritation / pain.   thiamine 100 MG tablet Commonly known as:  VITAMIN B-1 Take 1 tablet (100 mg total) by mouth daily.   traZODone 100 MG tablet Commonly known as:  DESYREL Take 1 tablet (100 mg total) by mouth at bedtime. What changed:  Another medication with the same name was removed. Continue taking this medication, and follow the directions you see here.       No Known Allergies  Consultations:  Psychiatry   Procedures/Studies: Ct Head Wo Contrast  Result Date: 12/30/2016 CLINICAL DATA:  Amitriptyline overdose, altered mental status EXAM: CT  HEAD WITHOUT CONTRAST TECHNIQUE: Contiguous axial images were obtained from the base of the skull through the vertex without intravenous contrast. COMPARISON:  Head CT 06/18/2014 FINDINGS: Brain: No mass lesion, intraparenchymal hemorrhage or extra-axial collection. No evidence of acute cortical infarct. Brain parenchyma and CSF-containing spaces are normal for age. Vascular: No hyperdense vessel or unexpected calcification. Skull: Normal visualized skull base, calvarium and extracranial soft tissues. Sinuses/Orbits: No sinus fluid levels or advanced mucosal thickening. No mastoid effusion. Normal orbits. IMPRESSION: Normal head CT. Electronically Signed   By: Deatra Robinson M.D.   On: 12/30/2016 23:06   Dg Chest Port 1 View  Result Date: 01/01/2017 CLINICAL DATA:  Acute respiratory failure with hypoxia. EXAM: PORTABLE CHEST 1 VIEW COMPARISON:  Radiographs of December 30, 2016. FINDINGS: The heart size and mediastinal contours are within normal limits. Endotracheal and nasogastric tubes have been removed. No pneumothorax or pleural effusion is noted. Minimal bibasilar subsegmental atelectasis is noted. The  visualized skeletal structures are unremarkable. IMPRESSION: Minimal bibasilar subsegmental atelectasis. Endotracheal and nasogastric tubes have been removed. Electronically Signed   By: Lupita Raider, M.D.   On: 01/01/2017 07:01   Dg Chest Portable 1 View  Result Date: 12/30/2016 CLINICAL DATA:  Endotracheal and enteric tube placement. EXAM: PORTABLE CHEST 1 VIEW COMPARISON:  Radiograph 03/25/2016 FINDINGS: Endotracheal tube is high in positioning 5.5 cm above the carina, above the thoracic inlet. Tip and side port of the enteric tube below the diaphragm in the stomach. Lung volumes are low. Prominent heart size likely accentuated by technique and low lung volumes. Subsegmental atelectasis at the left lung base. No confluent airspace disease, pleural fluid or pneumothorax. No acute osseous abnormality.  IMPRESSION: 1. Endotracheal tube high in positioning 5.5 cm from the carina and above the thoracic inlet. Advancement of 3 cm would lead to the optimal placement. Tip and side port of the enteric tube in the stomach. 2. Low lung volumes with left basilar atelectasis. Prominent heart size likely accentuated by low lung volumes and portable technique. Electronically Signed   By: Rubye Oaks M.D.   On: 12/30/2016 22:38    Discharge Exam: Vitals:   01/01/17 2136 01/02/17 0611  BP: (!) 153/98 140/84  Pulse: 82 74  Resp: 18 16  Temp: 98.3 F (36.8 C) 98.4 F (36.9 C)   Vitals:   01/01/17 0900 01/01/17 1559 01/01/17 2136 01/02/17 0611  BP: (!) 147/102 (!) 143/84 (!) 153/98 140/84  Pulse: 72 81 82 74  Resp: 19 20 18 16   Temp:  98.5 F (36.9 C) 98.3 F (36.8 C) 98.4 F (36.9 C)  TempSrc:  Oral Oral Oral  SpO2: 96% 97% 99% 99%  Weight:    98.6 kg (217 lb 6 oz)  Height:        General: Pt is alert, awake, not in acute distress Cardiovascular: RRR, S1/S2 +, no rubs, no gallops Respiratory: CTA bilaterally, no wheezing, no rhonchi Abdominal: Soft, NT, ND, bowel sounds + Extremities: no edema, no cyanosis   The results of significant diagnostics from this hospitalization (including imaging, microbiology, ancillary and laboratory) are listed below for reference.     Microbiology: Recent Results (from the past 240 hour(s))  MRSA PCR Screening     Status: Abnormal   Collection Time: 12/31/16  2:43 AM  Result Value Ref Range Status   MRSA by PCR POSITIVE (A) NEGATIVE Final    Comment:        The GeneXpert MRSA Assay (FDA approved for NASAL specimens only), is one component of a comprehensive MRSA colonization surveillance program. It is not intended to diagnose MRSA infection nor to guide or monitor treatment for MRSA infections. RESULT CALLED TO, READ BACK BY AND VERIFIED WITH: H JOHNSON AT 0415 ON 07.23.2018 BY NBROOKS      Labs: BNP (last 3 results) No results for  input(s): BNP in the last 8760 hours. Basic Metabolic Panel:  Recent Labs Lab 12/30/16 2150 12/31/16 0325 12/31/16 1314 01/01/17 0301 01/02/17 0637  NA 141 140 139 138 139  K 3.5 4.1 3.4* 3.0* 3.8  CL 106 108 102 101 104  CO2 24 25 28 29 27   GLUCOSE 106* 108* 106* 93 94  BUN 6 <5* <5* 5* 6  CREATININE 0.83 0.78 0.83 0.77 0.96  CALCIUM 9.0 7.1* 7.3* 7.7* 8.4*  MG  --  1.7  1.7  --  1.8 2.1  PHOS  --  2.8  2.8  --  3.4  --  Liver Function Tests:  Recent Labs Lab 12/30/16 2150  AST 49*  ALT 62  ALKPHOS 78  BILITOT 0.9  PROT 7.6  ALBUMIN 4.3   CBC:  Recent Labs Lab 12/30/16 2150 12/31/16 0325 01/01/17 0301 01/02/17 0637  WBC 7.6 9.5 10.6* 7.9  NEUTROABS 3.8  --   --   --   HGB 15.9 13.7 13.6 14.0  HCT 43.5 39.5 39.5 39.6  MCV 94.8 98.0 98.3 95.9  PLT 248 216 237 241   Cardiac Enzymes: No results for input(s): CKTOTAL, CKMB, CKMBINDEX, TROPONINI in the last 168 hours. BNP: Invalid input(s): POCBNP CBG:  Recent Labs Lab 12/30/16 2158  GLUCAP 105*   Lipid Profile  Recent Labs  12/31/16 0325  TRIG 445*   Thyroid function studies No results for input(s): TSH, T4TOTAL, T3FREE, THYROIDAB in the last 72 hours.  Invalid input(s): FREET3 Anemia work up No results for input(s): VITAMINB12, FOLATE, FERRITIN, TIBC, IRON, RETICCTPCT in the last 72 hours. Urinalysis    Component Value Date/Time   COLORURINE YELLOW 06/18/2014 2013   APPEARANCEUR CLOUDY (A) 06/18/2014 2013   LABSPEC 1.003 (L) 06/18/2014 2013   PHURINE 6.5 06/18/2014 2013   GLUCOSEU NEGATIVE 06/18/2014 2013   HGBUR TRACE (A) 06/18/2014 2013   BILIRUBINUR NEGATIVE 06/18/2014 2013   KETONESUR NEGATIVE 06/18/2014 2013   PROTEINUR NEGATIVE 06/18/2014 2013   UROBILINOGEN 0.2 06/18/2014 2013   NITRITE NEGATIVE 06/18/2014 2013   LEUKOCYTESUR NEGATIVE 06/18/2014 2013   Sepsis Labs Invalid input(s): PROCALCITONIN,  WBC,  LACTICIDVEN Microbiology Recent Results (from the past 240  hour(s))  MRSA PCR Screening     Status: Abnormal   Collection Time: 12/31/16  2:43 AM  Result Value Ref Range Status   MRSA by PCR POSITIVE (A) NEGATIVE Final    Comment:        The GeneXpert MRSA Assay (FDA approved for NASAL specimens only), is one component of a comprehensive MRSA colonization surveillance program. It is not intended to diagnose MRSA infection nor to guide or monitor treatment for MRSA infections. RESULT CALLED TO, READ BACK BY AND VERIFIED WITH: H JOHNSON AT 0415 ON 07.23.2018 BY NBROOKS      Time coordinating discharge: 35 minutes  SIGNED:  Latrelle DodrillEdwin Silva, MD  [facility] 01/02/2017, 12:24 PM  Pager please text page via  [email or web address] Password [email or web address]

## 2017-01-02 NOTE — Progress Notes (Signed)
CSW assisting with patient discharge to inpatient psychiatric facility. Per physician pt. Medically stable. CSW [facility] to [place] [facility], she is aware of patient need for placement.  Patient IVC faxed to [phone]. Will inform medical staff when bed is available.  Vivi BarrackNicole Cleston Lautner, Theresia MajorsLCSWA, MSW Clinical Social Worker 5E and Psychiatric Service Line [phone] 01/02/2017  9:55 AM

## 2017-01-02 NOTE — Progress Notes (Signed)
[name] is a 26 year old male being admitted involuntarily from [facility] floor.  He was brought to the ED after intention al OD on amitriptyline.  He notified his family that he took the OD and EMS was called and was subsequently hospitalized.  He has history of multiple psychiatric hospitalizations for alcohol intoxication and aggressive behavior.  He is diagnosed with alcohol dependence and polysubstance abuse.  He denies any medical history.  He denies SI/HI or A/V hallucinations.  He has agreed to contract for safety on the unit.  He c/o of sore throat from being intubated.  7/10.  He tested positive for MRSA in is nasal cavity and was being treated with mupirosin ointment, staffed with Jacki ConesLaurie NP and no ordered needed [facility] at [facility].  Oriented him to the unit. Admission paperwork completed and signed.  Belongings searched and secured in locker # 34.  Skin assessment completed and noted bruising on both arms from IV's.  Q 15 minute checks initiated for safety.  We will monitor the progress towards his goals.

## 2017-01-03 ENCOUNTER — Encounter (HOSPITAL_COMMUNITY): Payer: Self-pay | Admitting: Behavioral Health

## 2017-01-03 DIAGNOSIS — F1010 Alcohol abuse, uncomplicated: Secondary | ICD-10-CM

## 2017-01-03 DIAGNOSIS — Z560 Unemployment, unspecified: Secondary | ICD-10-CM

## 2017-01-03 LAB — TSH: TSH: 4.946 u[IU]/mL — ABNORMAL HIGH (ref 0.350–4.500)

## 2017-01-03 LAB — LIPID PANEL
CHOL/HDL RATIO: 5.4 ratio
CHOLESTEROL: 289 mg/dL — AB (ref 0–200)
HDL: 54 mg/dL (ref >40)
LDL Cholesterol: 193 mg/dL — ABNORMAL HIGH (ref 0–99)
TRIGLYCERIDES: 208 mg/dL — AB (ref <150)
VLDL: 42 mg/dL — AB (ref 0–40)

## 2017-01-03 NOTE — Plan of Care (Signed)
Problem: Activity: Goal: Interest or engagement in leisure activities will improve Outcome: Progressing Pt attended wrap-up group this evening.

## 2017-01-03 NOTE — Progress Notes (Signed)
Patient ID: Adam Novak, male   DOB: 1990-11-08, 26 y.o.   MRN: [id number] D  ---   Pt agrees to contract for safety and has denied pain today.  He has remained in his room in bed for most of the day.  Pt shows no negative behaviors and takes meds as asked.  Pt states that his withdrawal sympotms have eased up since this AM.  He is friendly to staff and shows no negative behaviors..  Pt took a good shower today before dinner.  --- A --  Provider safety and support  --  R ---- --- pt remains safe on unit
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
## 2017-01-03 NOTE — [facility] Group Notes (Signed)
[facility] LCSW Group Therapy  01/03/2017 2:34 PM  Type of Therapy:  Group Therapy  Participation Level:  Did Not Attend-pt invited. Sleeping in room.   Summary of Progress/Problems:  Finding Balance in Life. Today's group focused on defining balance in one's own words, identifying things that can knock one off balance, and exploring healthy ways to maintain balance in life. Group members were asked to provide an example of a time when they felt off balance, describe how they handled that situation,and process healthier ways to regain balance in the future. Group members were asked to share the most important tool for maintaining balance that they learned [facility] and how they plan to apply this method after discharge.   Taeko Schaffer N Smart LCSW 01/03/2017, 2:34 PM

## 2017-01-03 NOTE — Tx Team (Signed)
Interdisciplinary Treatment and Diagnostic Plan Update  01/03/2017 Time of Session: 2330QT Adam Novak: [id number]  Principal Diagnosis: Bipolar 1 disorder [facility])  Secondary Diagnoses: Principal Problem:   Bipolar 1 disorder ([place]) Active Problems:   Suicide attempt [facility])   Current Medications:  Current Facility-Administered Medications  Medication Dose Route Frequency Provider Last Rate Last Dose  . acetaminophen (TYLENOL) tablet 650 mg  650 mg Oral Q6H PRN Ethelene Hal, NP      . alum & mag hydroxide-simeth (MAALOX/MYLANTA) 200-200-20 MG/5ML suspension 30 mL  30 mL Oral Q4H PRN Ethelene Hal, NP      . haloperidol (HALDOL) tablet 5 mg  5 mg Oral Q6H PRN Ethelene Hal, NP       And  . benztropine (COGENTIN) tablet 1 mg  1 mg Oral Q6H PRN Ethelene Hal, NP      . chlordiazePOXIDE (LIBRIUM) capsule 25 mg  25 mg Oral Q6H PRN Lindell Spar I, NP      . chlordiazePOXIDE (LIBRIUM) capsule 25 mg  25 mg Oral QID Lindell Spar I, NP   25 mg at 01/02/17 2113   Followed by  . chlordiazePOXIDE (LIBRIUM) capsule 25 mg  25 mg Oral TID Lindell Spar I, NP       Followed by  . [START ON 01/04/2017] chlordiazePOXIDE (LIBRIUM) capsule 25 mg  25 mg Oral BH-qamhs Lindell Spar I, NP       Followed by  . [START ON 01/06/2017] chlordiazePOXIDE (LIBRIUM) capsule 25 mg  25 mg Oral Daily Nwoko, Agnes I, NP      . divalproex (DEPAKOTE ER) 24 hr tablet 1,000 mg  1,000 mg Oral QHS Ethelene Hal, NP   1,000 mg at 01/02/17 2113  . gabapentin (NEURONTIN) capsule 300 mg  300 mg Oral TID Ethelene Hal, NP   300 mg at 01/02/17 1640  . hydrOXYzine (ATARAX/VISTARIL) tablet 25 mg  25 mg Oral Q6H PRN Lindell Spar I, NP      . loperamide (IMODIUM) capsule 2-4 mg  2-4 mg Oral PRN Lindell Spar I, NP      . magnesium hydroxide (MILK OF MAGNESIA) suspension 30 mL  30 mL Oral Daily PRN Ethelene Hal, NP      . menthol-cetylpyridinium (CEPACOL) lozenge 3 mg  1  lozenge Oral PRN Ethelene Hal, NP      . multivitamin with minerals tablet 1 tablet  1 tablet Oral Daily Nwoko, Agnes I, NP   1 tablet at 01/02/17 1640  . naltrexone (DEPADE) tablet 50 mg  50 mg Oral Daily Ethelene Hal, NP   50 mg at 01/02/17 1640  . ondansetron (ZOFRAN-ODT) disintegrating tablet 4 mg  4 mg Oral Q6H PRN Nwoko, Agnes I, NP      . thiamine (VITAMIN B-1) tablet 100 mg  100 mg Oral Daily Nwoko, Agnes I, NP      . traZODone (DESYREL) tablet 100 mg  100 mg Oral QHS Ethelene Hal, NP   100 mg at 01/02/17 2113   PTA Medications: Prescriptions Prior to Admission  Medication Sig Dispense Refill Last Dose  . divalproex (DEPAKOTE ER) 500 MG 24 hr tablet Take 2 tablets (1,000 mg total) by mouth at bedtime. 60 tablet 0 Past Week at Unknown time  . famotidine (PEPCID) 20 MG tablet Take 1 tablet (20 mg total) by mouth daily.     . folic acid (FOLVITE) 1 MG tablet Take 1 tablet (1 mg total) by mouth  daily.  3   . gabapentin (NEURONTIN) 300 MG capsule Take 1 capsule (300 mg total) by mouth 3 (three) times daily. 90 capsule 0 Past Week at Unknown time  . Multiple Vitamin (MULTIVITAMIN WITH MINERALS) TABS tablet Take 1 tablet by mouth daily.   Past Week at Unknown time  . naltrexone (DEPADE) 50 MG tablet Take 1 tablet (50 mg total) by mouth daily. 30 tablet 0 Past Week at Unknown time  . phenol (CHLORASEPTIC) 1.4 % LIQD Use as directed 1 spray in the mouth or throat as needed for throat irritation / pain.  0   . thiamine (VITAMIN B-1) 100 MG tablet Take 1 tablet (100 mg total) by mouth daily.     . traZODone (DESYREL) 100 MG tablet Take 1 tablet (100 mg total) by mouth at bedtime. 30 tablet 0 Past Week at Unknown time    Patient Stressors: Financial difficulties Legal issue Medication change or noncompliance Occupational concerns Substance abuse  Patient Strengths: Curator fund of knowledge Physical Health  Treatment Modalities: Medication  Management, Group therapy, Case management,  1 to 1 session with clinician, Psychoeducation, Recreational therapy.   Physician Treatment Plan for Primary Diagnosis: Bipolar 1 disorder ([place]) Long Term Goal(s): Improvement in symptoms so as ready for discharge Improvement in symptoms so as ready for discharge   Short Term Goals: Ability to identify changes in lifestyle to reduce recurrence of condition will improve Ability to verbalize feelings will improve Compliance with prescribed medications will improve Ability to identify triggers associated with substance abuse/mental health issues will improve Ability to disclose and discuss suicidal ideas Ability to identify and develop effective coping behaviors will improve  Medication Management: Evaluate patient's response, side effects, and tolerance of medication regimen.  Therapeutic Interventions: 1 to 1 sessions, Unit Group sessions and Medication administration.  Evaluation of Outcomes: Not Met  Physician Treatment Plan for Secondary Diagnosis: Principal Problem:   Bipolar 1 disorder ([place]) Active Problems:   Suicide attempt ([place])  Long Term Goal(s): Improvement in symptoms so as ready for discharge Improvement in symptoms so as ready for discharge   Short Term Goals: Ability to identify changes in lifestyle to reduce recurrence of condition will improve Ability to verbalize feelings will improve Compliance with prescribed medications will improve Ability to identify triggers associated with substance abuse/mental health issues will improve Ability to disclose and discuss suicidal ideas Ability to identify and develop effective coping behaviors will improve     Medication Management: Evaluate patient's response, side effects, and tolerance of medication regimen.  Therapeutic Interventions: 1 to 1 sessions, Unit Group sessions and Medication administration.  Evaluation of Outcomes: Not Met   RN Treatment Plan for Primary  Diagnosis: Bipolar 1 disorder ([place]) Long Term Goal(s): Knowledge of disease and therapeutic regimen to maintain health will improve  Short Term Goals: Ability to remain free from injury will improve, Ability to disclose and discuss suicidal ideas and Ability to identify and develop effective coping behaviors will improve  Medication Management: RN will administer medications as ordered by provider, will assess and evaluate patient's response and provide education to patient for prescribed medication. RN will report any adverse and/or side effects to prescribing provider.  Therapeutic Interventions: 1 on 1 counseling sessions, Psychoeducation, Medication administration, Evaluate responses to treatment, Monitor vital signs and CBGs as ordered, Perform/monitor CIWA, COWS, AIMS and Fall Risk screenings as ordered, Perform wound care treatments as ordered.  Evaluation of Outcomes: Not Met   LCSW Treatment Plan for Primary Diagnosis:  Bipolar 1 disorder ([place]) Long Term Goal(s): Safe transition to appropriate next level of care at discharge, Engage patient in therapeutic group addressing interpersonal concerns.  Short Term Goals: Engage patient in aftercare planning with referrals and resources, Facilitate patient progression through stages of change regarding substance use diagnoses and concerns and Identify triggers associated with mental health/substance abuse issues  Therapeutic Interventions: Assess for all discharge needs, 1 to 1 time with Social worker, Explore available resources and support systems, Assess for adequacy in community support network, Educate family and significant other(s) on suicide prevention, Complete Psychosocial Assessment, Interpersonal group therapy.  Evaluation of Outcomes: Not Met   Progress in Treatment: Attending groups: No. New to unit. Continuing to assess.  Participating in groups: No. Taking medication as prescribed: Yes. Toleration medication:  Yes. Family/Significant other contact made: No, will contact:  family member if patient consents Patient understands diagnosis: Yes. Discussing patient identified problems/goals with staff: Yes. Medical problems stabilized or resolved: Yes. Denies suicidal/homicidal ideation: Yes. Issues/concerns per patient self-inventory: No. Other: n/a   New problem(s) identified: No, N/A  New Short Term/Long Term Goal(s): medication management for mood stabilization; detox; development of comprehensive mental wellness/sobriety plan.   Discharge Plan or Barriers: CSW assessing for appropriate referrals. Pt goes to [facility] for medication management.   Reason for Continuation of Hospitalization: Anxiety Depression Medication stabilization Suicidal ideation Withdrawal symptoms  Estimated Length of Stay: Monday, 01/07/17  Attendees: Patient: 01/03/2017 8:23 AM  Physician: Dr. Dwyane Dee MD; Dr. Parke Poisson MD 01/03/2017 8:23 AM  Nursing: Evalee Mutton RN 01/03/2017 8:23 AM  RN Care Manager: Lars Pinks CM 01/03/2017 8:23 AM  Social Worker: Press photographer, LCSW 01/03/2017 8:23 AM  Recreational Therapist: x 01/03/2017 8:23 AM  Other: Mordecai Maes NP; Darnelle Maffucci Money NP 01/03/2017 8:23 AM  Other:  01/03/2017 8:23 AM  Other: 01/03/2017 8:23 AM    Scribe for Treatment Team: [place], LCSW 01/03/2017 8:23 AM

## 2017-01-03 NOTE — Progress Notes (Signed)
  DATA ACTION RESPONSE  Objective- Pt. is visible in the dayroom, seen watching TV. Presents with an animated affect and mood. No further c/o. BP trending; Pt asymptomatic. Pt states he would like to live with sister once d/c for time being.  Subjective- Denies having any SI/HI/AVH/Pain at this time.Pt. states "I feel great; I am just happy to be alive".   Is cooperative and remain safe on the unit.  1:1 interaction in private to establish rapport. Encouragement, education, & support given from staff.    Safety maintained with Q 15 checks. Continue with POC.

## 2017-01-03 NOTE — Progress Notes (Signed)
Pt is new to the unit earlier today.  He was in the bed at the beginning of the shift, but was easily awakened by Clinical research associatewriter for assessment.  Pt reports that he is here because he tried to kill himself by overdosing on amitriptyline and spent several days in the ICU.  Now he denies SI/HI/AVH and does not really know why he did it, but thinks it was probably because he was intoxicated at the time.  He voiced no needs or concerns this evening.  He took his scheduled night meds without incident.  He chose not to attend the evening NA group.  He was polite and cooperative with staff.  Support and encouragement offered.  Discharge plans are in process.  Safety maintained with q15 minute checks.

## 2017-01-03 NOTE — Progress Notes (Signed)
Patient did not attend the evening speaker NA meeting. Pt was notified that group was beginning but remained in bed.   

## 2017-01-03 NOTE — Progress Notes (Signed)
Patient attended group and said that his day was a 5.  He was excited because his family came to visit him.

## 2017-01-04 ENCOUNTER — Encounter (HOSPITAL_COMMUNITY): Payer: Self-pay | Admitting: Behavioral Health

## 2017-01-04 DIAGNOSIS — F1994 Other psychoactive substance use, unspecified with psychoactive substance-induced mood disorder: Secondary | ICD-10-CM

## 2017-01-04 DIAGNOSIS — F17210 Nicotine dependence, cigarettes, uncomplicated: Secondary | ICD-10-CM

## 2017-01-04 DIAGNOSIS — F1390 Sedative, hypnotic, or anxiolytic use, unspecified, uncomplicated: Secondary | ICD-10-CM

## 2017-01-04 DIAGNOSIS — T1491XA Suicide attempt, initial encounter: Secondary | ICD-10-CM

## 2017-01-04 DIAGNOSIS — G4700 Insomnia, unspecified: Secondary | ICD-10-CM

## 2017-01-04 DIAGNOSIS — F319 Bipolar disorder, unspecified: Secondary | ICD-10-CM

## 2017-01-04 DIAGNOSIS — T43012A Poisoning by tricyclic antidepressants, intentional self-harm, initial encounter: Secondary | ICD-10-CM

## 2017-01-04 DIAGNOSIS — F1290 Cannabis use, unspecified, uncomplicated: Secondary | ICD-10-CM

## 2017-01-04 LAB — HEMOGLOBIN A1C
Hgb A1c MFr Bld: 4.9 % (ref 4.8–5.6)
Mean Plasma Glucose: 94 mg/dL

## 2017-01-04 MED ORDER — OMEGA-3-ACID ETHYL ESTERS 1 G PO CAPS
1.0000 g | ORAL_CAPSULE | Freq: Two times a day (BID) | ORAL | Status: DC
  Administered 2017-01-04 – 2017-01-07 (×6): 1 g via ORAL
  Filled 2017-01-04: qty 1
  Filled 2017-01-04: qty 14
  Filled 2017-01-04 (×3): qty 1
  Filled 2017-01-04: qty 14
  Filled 2017-01-04 (×4): qty 1

## 2017-01-04 MED ORDER — ATORVASTATIN CALCIUM 20 MG PO TABS
20.0000 mg | ORAL_TABLET | Freq: Every day | ORAL | Status: DC
  Administered 2017-01-05 – 2017-01-06 (×3): 20 mg via ORAL
  Filled 2017-01-04 (×3): qty 1
  Filled 2017-01-04: qty 7
  Filled 2017-01-04: qty 1

## 2017-01-04 NOTE — Progress Notes (Signed)
Recreation Therapy Notes  Date: 01/04/2017 Time: 9:30am Location: 300 Hall Dayroom  Group Topic: Stress Management  Goal Area(s) Addresses:  Patient will verbalize importance of using healthy stress management.  Patient will identify positive emotions associated with healthy stress management.   Intervention: Stress Management  Activity : Self-Esteem Meditation. Recreation Therapy Intern introduced the stress management technique of meditation. Recreation Therapy Intern read a script that allowed patients to focus on boosting their self-esteem. Recreation Therapy Intern played calming music. Patients were to follow along as script was read to engage in the activity.  Education: Stress Management, Discharge Planning.   Education Outcome: Needs additional education  Clinical Observations/Feedback: Pt did not attend group.  Rachel Meyer, Recreation Therapy Intern  Adam Novak, LRT/CTRS 

## 2017-01-04 NOTE — [facility] Group Notes (Signed)
[facility] LCSW Group Therapy  01/04/2017 3:26 PM  Type of Therapy:  Group Therapy  Participation Level:  Did Not Attend-pt invited. Chose to remain in bed.   Summary of Progress/Problems: Today's Topic: Overcoming Obstacles. Patients identified one short term goal and potential obstacles in reaching this goal. Patients processed barriers involved in overcoming these obstacles. Patients identified steps necessary for overcoming these obstacles and explored motivation (internal and external) for facing these difficulties head on.   Tyner Codner N Smart LCSW 01/04/2017, 3:26 PM

## 2017-01-04 NOTE — Progress Notes (Signed)
[facility] MD Progress Note  01/04/2017 11:43 AM Adam Novak  MRN:  [id number]   Subjective:  " I am doing well. Feeling better since I came here."  Objective: Fcae to face evaluation completed, case discussed with treatment team, chart reviewed. Patient was   admitted to [facility] status post overdose. He presents to [facility] with a extensive psychiatric background as well as a history of ETOH abuse.   During this admission, patient is alert and oriented x4, calm, cooperative. He endorses minimal withdrawal symptoms at this time. He does express some nausea. He denies any thoughts of wanting to hurt himself or thoughts of suicide. He is able to contract for safety at this time. Denies HI or AVH and does not appear to be internally preoccupied. Denies depressive symptoms although he does endorse some anxiety secondary to legal issues as well as not being financially stable. He seems to be tolerating medications well and denies side effects. He continues to endorse that he does want treatment for ETOH abuse after discharge yet continues to endorse that he does not want inpatient care. He has a long history of substance use, a  history of violent behaviors when drinking, legal issues secondary to his abuse, strong family history of addiction on both sides of the family so inpatient treatment would be more suitable. Endorses appetite as fair and sleeping pattern as overall fair although he does endorses he had some difficulty resting last night.    As per nursing; Presents with an animated affect and mood. No further c/o. BP trending; Pt asymptomatic. Pt states he would like to live with sister once d/c for time being.    Principal Problem: Bipolar 1 disorder (HCC) Diagnosis:   Patient Active Problem List   Diagnosis Date Noted  . Suicide attempt (HCC) [T14.91XA] 01/02/2017  . Amitriptyline overdose [T43.011A] 12/31/2016  . Substance induced mood disorder (HCC) [F19.94] 12/21/2016  . Polysubstance  dependence (HCC) [F19.20] 12/21/2016  . Bipolar affective disorder, depressed, severe (HCC) [F31.4] 08/07/2016  . Bipolar 1 disorder (HCC) [F31.9] 12/16/2015  . Alcohol dependence with alcohol-induced mood disorder (HCC) [F10.24]   . Alcohol use disorder, severe, dependence (HCC) [F10.20] 12/15/2015  . Bipolar I disorder, most recent episode depressed (HCC) [F31.30] 05/15/2015  . Dog bite of left calf [S81.852A, W54.0XXA] 06/19/2014  . Alcohol abuse [F10.10] 06/18/2014  . Alcohol withdrawal (HCC) [F10.239] 06/18/2014  . Alcohol intoxication, episodic (HCC) [F10.129] 06/18/2014  . Toxic encephalopathy [G92] 06/18/2014  . Leukocytosis [D72.829]   . Polysubstance abuse [F19.10]   . CLOSED FRACTURE OF NECK OF METACARPAL BONE [S62.339A] 06/16/2010   Total Time spent with patient: 20 minutes  Past Psychiatric History:  Long history of substance use. Has been admitted fivetimes and last admission was 08/07/2016. All related to violent behavior while intoxicated. Has been on tried on Depakote in the past. Says it was helpful and he wants to take it again. Has had suicidal thoughts while under the influence. No past suicidal behavior. His gun was taken away by the police during one of his violent episodes. Patient currently receives medications through [facility]. Current medications are trazodone 100 mg at bedtime, naltrexone 50 mg daily, gabapentin 300 mg 3 times daily, Depakote ER thousand milligrams at bedtime    Past Medical History:  Past Medical History:  Diagnosis Date  . Bipolar 1 disorder [facility])     Past Surgical History:  Procedure Laterality Date  . KNEE SURGERY Right    Family History: History reviewed. No pertinent family  history. Family Psychiatric  History: Strong family history of addiction in both sides of the family Social History:  History  Alcohol Use  . Yes    Comment: Pint or more liquor daily     History  Drug Use  . Types: Marijuana, Benzodiazepines, MDMA (Ecstacy)     Social History   Social History  . Marital status: Single    Spouse name: N/A  . Number of children: N/A  . Years of education: N/A   Social History Main Topics  . Smoking status: Current Some Day Smoker    Packs/day: 0.50    Types: Cigarettes  . Smokeless tobacco: Never Used  . Alcohol use Yes     Comment: Pint or more liquor daily  . Drug use: Yes    Types: Marijuana, Benzodiazepines, MDMA (Ecstacy)  . Sexual activity: No   Other Topics Concern  . None   Social History Narrative  . None   Additional Social History:    Pain Medications: see PTA meds Prescriptions: see PTA meds Over the Counter: see PTA meds History of alcohol / drug use?: Yes        Sleep: Fair  Appetite:  Fair  Current Medications: Current Facility-Administered Medications  Medication Dose Route Frequency Provider Last Rate Last Dose  . acetaminophen (TYLENOL) tablet 650 mg  650 mg Oral Q6H PRN Laveda AbbeParks, Laurie Britton, NP      . alum & mag hydroxide-simeth (MAALOX/MYLANTA) 200-200-20 MG/5ML suspension 30 mL  30 mL Oral Q4H PRN Laveda AbbeParks, Laurie Britton, NP      . haloperidol (HALDOL) tablet 5 mg  5 mg Oral Q6H PRN Laveda AbbeParks, Laurie Britton, NP       And  . benztropine (COGENTIN) tablet 1 mg  1 mg Oral Q6H PRN Laveda AbbeParks, Laurie Britton, NP      . chlordiazePOXIDE (LIBRIUM) capsule 25 mg  25 mg Oral Q6H PRN Armandina StammerNwoko, Agnes I, NP      . chlordiazePOXIDE (LIBRIUM) capsule 25 mg  25 mg Oral TID Armandina StammerNwoko, Agnes I, NP   25 mg at 01/04/17 [id number]   Followed by  . chlordiazePOXIDE (LIBRIUM) capsule 25 mg  25 mg Oral BH-qamhs Armandina StammerNwoko, Agnes I, NP       Followed by  . [START ON 01/06/2017] chlordiazePOXIDE (LIBRIUM) capsule 25 mg  25 mg Oral Daily Nwoko, Agnes I, NP      . divalproex (DEPAKOTE ER) 24 hr tablet 1,000 mg  1,000 mg Oral QHS Laveda AbbeParks, Laurie Britton, NP   1,000 mg at 01/03/17 2208  . gabapentin (NEURONTIN) capsule 300 mg  300 mg Oral TID Laveda AbbeParks, Laurie Britton, NP   300 mg at 01/04/17 [id number]  . hydrOXYzine  (ATARAX/VISTARIL) tablet 25 mg  25 mg Oral Q6H PRN Armandina StammerNwoko, Agnes I, NP      . loperamide (IMODIUM) capsule 2-4 mg  2-4 mg Oral PRN Armandina StammerNwoko, Agnes I, NP      . magnesium hydroxide (MILK OF MAGNESIA) suspension 30 mL  30 mL Oral Daily PRN Laveda AbbeParks, Laurie Britton, NP      . menthol-cetylpyridinium (CEPACOL) lozenge 3 mg  1 lozenge Oral PRN Laveda AbbeParks, Laurie Britton, NP      . multivitamin with minerals tablet 1 tablet  1 tablet Oral Daily Armandina StammerNwoko, Agnes I, NP   1 tablet at 01/04/17 0912  . naltrexone (DEPADE) tablet 50 mg  50 mg Oral Daily Laveda AbbeParks, Laurie Britton, NP   50 mg at 01/04/17 0912  . ondansetron (ZOFRAN-ODT) disintegrating tablet 4 mg  4  mg Oral Q6H PRN Armandina StammerNwoko, Agnes I, NP      . thiamine (VITAMIN B-1) tablet 100 mg  100 mg Oral Daily Nwoko, Agnes I, NP   100 mg at 01/04/17 0912  . traZODone (DESYREL) tablet 100 mg  100 mg Oral QHS Laveda AbbeParks, Laurie Britton, NP   100 mg at 01/03/17 2208    Lab Results:  Results for orders placed or performed during the hospital encounter of 01/02/17 (from the past 48 hour(s))  TSH     Status: Abnormal   Collection Time: 01/03/17  6:37 AM  Result Value Ref Range   TSH 4.946 (H) 0.350 - 4.500 uIU/mL    Comment: Performed by a 3rd Generation assay with a functional sensitivity of <=0.01 uIU/mL. Performed at [facility], [address] [place], [place] [address]   Hemoglobin A1c     Status: None   Collection Time: 01/03/17  6:37 AM  Result Value Ref Range   Hgb A1c MFr Bld 4.9 4.8 - 5.6 %    Comment: (NOTE)         Pre-diabetes: 5.7 - 6.4         Diabetes: >6.4         Glycemic control for adults with diabetes: <7.0    Mean Plasma Glucose 94 mg/dL    Comment: (NOTE) Performed At: [facility] [address] [place], [place] [id number] Mila HomerHancock William F MD ZH:[id number]:[phone] Performed at [facility], [address] [place], [place] [address]   Lipid panel     Status: Abnormal   Collection Time: 01/03/17  6:37 AM   Result Value Ref Range   Cholesterol 289 (H) 0 - 200 mg/dL   Triglycerides 413208 (H) <150 mg/dL   HDL 54 >24>40 mg/dL   Total CHOL/HDL Ratio 5.4 RATIO   VLDL 42 (H) 0 - 40 mg/dL   LDL Cholesterol 401193 (H) 0 - 99 mg/dL    Comment:        Total Cholesterol/HDL:CHD Risk Coronary Heart Disease Risk Table                     Men   Women  1/2 Average Risk   3.4   3.3  Average Risk       5.0   4.4  2 X Average Risk   9.6   7.1  3 X Average Risk  23.4   11.0        Use the calculated Patient Ratio above and the CHD Risk Table to determine the patient's CHD Risk.        ATP III CLASSIFICATION (LDL):  <100     mg/dL   Optimal  [id number]  mg/dL   Near or Above                    Optimal  130-159  mg/dL   Borderline  [phone]  mg/dL   High  >474>190     mg/dL   Very High Performed at [facility], [address] [place], [place] [address]     Blood Alcohol level:  Lab Results  Component Value Date   ETH 185 (H) 12/30/2016   ETH 239 (H) 12/20/2016    Metabolic Disorder Labs: Lab Results  Component Value Date   HGBA1C 4.9 01/03/2017   MPG 94 01/03/2017   MPG 91 12/17/2015   No results found for: PROLACTIN Lab Results  Component Value Date   CHOL  289 (H) 01/03/2017   TRIG 208 (H) 01/03/2017   HDL 54 01/03/2017   CHOLHDL 5.4 01/03/2017   VLDL 42 (H) 01/03/2017   LDLCALC 193 (H) 01/03/2017   LDLCALC 148 (H) 12/17/2015    Physical Findings: AIMS: Facial and Oral Movements Muscles of Facial Expression: None, normal Lips and Perioral Area: None, normal Jaw: None, normal Tongue: None, normal,Extremity Movements Upper (arms, wrists, hands, fingers): None, normal Lower (legs, knees, ankles, toes): None, normal, Trunk Movements Neck, shoulders, hips: None, normal, Overall Severity Severity of abnormal movements (highest score from questions above): None, normal Incapacitation due to abnormal movements: None, normal Patient's awareness of abnormal movements (rate only patient's  report): No Awareness, Dental Status Current problems with teeth and/or dentures?: No Does patient usually wear dentures?: No  CIWA:  CIWA-Ar Total: 0 COWS:     Musculoskeletal: Strength & Muscle Tone: within normal limits Gait & Station: normal Patient leans: N/A  Psychiatric Specialty Exam: Physical Exam  Nursing note and vitals reviewed. Constitutional: He is oriented to person, place, and time.  Neurological: He is alert and oriented to person, place, and time.    Review of Systems  Psychiatric/Behavioral: Positive for substance abuse. Negative for depression, hallucinations, memory loss and suicidal ideas. The patient is nervous/anxious. The patient does not have insomnia.   All other systems reviewed and are negative.   Blood pressure (!) 148/106, pulse 96, temperature 98.2 F (36.8 C), resp. rate 18, height 5\' 9"  (1.753 m), weight 205 lb (93 kg), SpO2 98 %.Body mass index is 30.27 kg/m.  General Appearance: Fairly Groomed  Eye Contact:  Good  Speech:  Clear and Coherent and Normal Rate  Volume:  Normal  Mood:  Anxious  Affect:  Appropriate  Thought Process:  Coherent, Linear and Descriptions of Associations: Intact  Orientation:  Full (Time, Place, and Person)  Thought Content:  Logical denies AVH, no preoccupations, no ruminations   Suicidal Thoughts:  No  Homicidal Thoughts:  No  Memory:  Negative Immediate;   Fair Recent;   Fair  Judgement:  Fair  Insight:  Fair  Psychomotor Activity:  Normal  Concentration:  Concentration: Fair and Attention Span: Fair  Recall:  [employer] of Knowledge:  Fair  Language:  Good  Akathisia:  Negative  Handed:  Right  AIMS (if indicated):     Assets:  Desire for Improvement Resilience  ADL's:  Intact  Cognition:  WNL  Sleep:  Number of Hours: 5.5     Treatment Plan Summary: Daily contact with patient to assess and evaluate symptoms and progress in treatment   Medication management: Psychiatric conditions are unstable  at this time. To reduce current symptoms to base line and improve the patient's overall level of functioning will continue the following treatment plan without adjustments;   Continue Depakote ER 1000 mg po daily at bedtime for mood disorder . Continue Trazodone 100 mg at bedtime for insomnia  Continue naltrexone 50 mg daily for substance abuse   Continue gabapentin 300 mg 3 times daily for anxiety/agitation   Continue librium protocol for withdrawal symptoms.?  Start Lipitor 20 mg po daily at 1800 for hyperlipidemia and Lovaza 1 g po bid   Other:  Safety: Will continue 15 minute observation for safety checks. Patient is able to contract for safety on the unit at this time  Labs: cholesterol 289, triglycerides 208, LDL 193. HgbA1c and TSH normal. Valproic level ordered for 01/05/2017.  Continue to develop treatment plan to decrease risk of  relapse upon discharge and to reduce the need for readmission.  Psycho-social education regarding relapse prevention and self care.  Health care follow up as needed for medical problems.  Continue to attend and participate in therapy.   CSW will continue to work on discharge planning. Will contnue to encourage inpatient treatment for substance abuse after discharged.   Denzil Magnuson, NP 01/04/2017, 11:43 AM   Reviewed the information documented and agree with the treatment plan.  Vrishank Moster 01/06/2017 1:20 PM

## 2017-01-04 NOTE — Progress Notes (Signed)
[name] has been seen mostly in the dayroom, interacting with his fellow patients. He denied withdrawal symtoms, denied need for prns and is pleasant and cooperative top deal with . A HE completed his daily assessment and on this he wrote he deneid SI today and he rated his depressin, hopelessness and anxeity " 0/0/5", respectively. R Safety in place.

## 2017-01-04 NOTE — [facility] Suicide Risk Assessment (Signed)
[facility]   Nursing information obtained from:  Patient Demographic factors:  Male Current Mental Status:  NA Loss Factors:  Decrease in vocational status, Legal issues, Financial problems / change in socioeconomic status Historical Factors:  Prior suicide attempts, Family history of mental illness or substance abuse, Family history of suicide Risk Reduction Factors:  Living with another person, especially a relative  Total Time spent with patient:  Principal Problem: Bipolar 1 disorder (HCC) Diagnosis:   Patient Active Problem List   Diagnosis Date Noted  . Suicide attempt (HCC) [T14.91XA] 01/02/2017  . Amitriptyline overdose [T43.011A] 12/31/2016  . Substance induced mood disorder (HCC) [F19.94] 12/21/2016  . Polysubstance dependence (HCC) [F19.20] 12/21/2016  . Bipolar affective disorder, depressed, severe (HCC) [F31.4] 08/07/2016  . Bipolar 1 disorder (HCC) [F31.9] 12/16/2015  . Alcohol dependence with alcohol-induced mood disorder (HCC) [F10.24]   . Alcohol use disorder, severe, dependence (HCC) [F10.20] 12/15/2015  . Bipolar I disorder, most recent episode depressed (HCC) [F31.30] 05/15/2015  . Dog bite of left calf [S81.852A, W54.0XXA] 06/19/2014  . Alcohol abuse [F10.10] 06/18/2014  . Alcohol withdrawal (HCC) [F10.239] 06/18/2014  . Alcohol intoxication, episodic (HCC) [F10.129] 06/18/2014  . Toxic encephalopathy [G92] 06/18/2014  . Leukocytosis [D72.829]   . Polysubstance abuse [F19.10]   . CLOSED FRACTURE OF NECK OF METACARPAL BONE [S62.339A] 06/16/2010     Continued Clinical Symptoms:  Alcohol Use Disorder Identification Test Final Score (AUDIT): 25 The "Alcohol Use Disorders Identification Test", Guidelines for Use in Primary Care, Second Edition.  World Science writerHealth [employer] [facility](WHO). Score between 0-7:  no or low risk or alcohol related problems. Score between 8-15:  moderate risk of alcohol related problems. Score between 16-19:  high risk  of alcohol related problems. Score 20 or above:  warrants further diagnostic evaluation for alcohol dependence and treatment.   CLINICAL FACTORS:  26 year old male . Overdosed on Amitryptiline ( which was prescribed to father) and on alcohol. States overdose was impulsive, unplanned. After overdose called sister and father, and was taken to ED. Overdose was severe and patient became unresponsive, required intubation for airway protection . Transferred to inpatient psychiatric unit once medically cleared  At this time patient reports feeling better, denies current suicidal ideations, and states he is thankful he is alive . Attributes overdose at least partly to having been intoxicated with ETOH  - admission BAL 185. Patient has history of bipolar disorder and of alcohol use disorder and has had prior psychiatric admissions . Patient states " it is always the alcohol, that's my problem, when I am not drinking I am better ".  Dx- Alcohol Dependence, Bipolar Disorder by history, S/P Suicide Attempt by Tricyclic Overdose  Plan- patient was admitted to inpatient unit, was  started on Librium detox to minimize risk of WDL, and on Depakote ER for mood disorder      Musculoskeletal: Strength & Muscle Tone: within normal limits no acute distress, no diaphoresis , no gross tremors at this time Gait & Station: normal Patient leans: N/A  Psychiatric Specialty Exam: Physical Exam  ROS  Blood pressure (!) 151/97, pulse 85, temperature 98.2 F (36.8 C), resp. rate 18, height 5\' 9"  (1.753 m), weight 93 kg (205 lb), SpO2 98 %.Body mass index is 30.27 kg/m.  General Appearance: Fairly Groomed  Eye Contact:  Good  Speech:  Normal Rate  Volume:  Decreased  Mood:  mildly depressed, but states " I feel a lot better "  Affect:  constricted, somewhat dysphoric,  but reactive   Thought Process:  Linear and Descriptions of Associations: Intact  Orientation:  Full (Time, Place, and Person) currently alert,  attentive, oriented x 3   Thought Content:  denies hallucinations, no delusions expressed   Suicidal Thoughts:  No denies any current suicidal ideations   Homicidal Thoughts:  No  Memory:  recent and remote fair- only partial recollection of overdose and subsequent events   Judgement:  Fair  Insight:  Fair  Psychomotor Activity:  Normal  Concentration:  Concentration: Good and Attention Span: Good  Recall:  Good  Fund of Knowledge:  Good  Language:  Good  Akathisia:  Negative  Handed:  Right  AIMS (if indicated):     Assets:  Desire for Improvement Resilience  ADL's:  Fair- improving   Cognition:  WNL  Sleep:  Number of Hours: 5.5      COGNITIVE FEATURES THAT CONTRIBUTE TO RISK:  Closed-mindedness and Loss of executive function    SUICIDE RISK:   Moderate:  Frequent suicidal ideation with limited intensity, and duration, some specificity in terms of plans, no associated intent, good self-control, limited dysphoria/symptomatology, some risk factors present, and identifiable protective factors, including available and accessible social support.  PLAN OF CARE: Patient will be admitted to inpatient psychiatric unit for stabilization and safety. Will provide and encourage milieu participation. Provide medication management and maked adjustments as needed. Will also provide medication management to minimize risk of withdrawal. Will follow daily.    I certify that inpatient services furnished can reasonably be expected to improve the patient's condition.   Craige CottaFernando A Cobos, MD 01/04/2017, 3:21 PM

## 2017-01-04 NOTE — Progress Notes (Signed)
Patient attended AA group meeting.  

## 2017-01-05 ENCOUNTER — Encounter (HOSPITAL_COMMUNITY): Payer: Self-pay | Admitting: Behavioral Health

## 2017-01-05 LAB — VALPROIC ACID LEVEL: Valproic Acid Lvl: 53 ug/mL (ref 50.0–100.0)

## 2017-01-05 NOTE — Progress Notes (Signed)
D Pt is seen Oob UAL on the 300 hall today..he is more birght, he smiles cooperatively, he is pleasant and takes his scheduled meds as planned. A He completes his daily assessment and this he writes he deneis SI today and he rates his depression, hoeplessness and anxeity  " 0/0/4", respectively. R He states he is hoping for dc tomorrow.

## 2017-01-05 NOTE — Progress Notes (Signed)
[facility] MD Progress Note  01/05/2017 12:22 PM Adam Novak  MRN:  [id number]   Subjective:  " Doing very well. I talked to my sister and I am going to live with her when I leave here."  Objective: Fcae to face evaluation completed, case discussed with treatment team, chart reviewed. Patient was   admitted to [facility] status post overdose. He presents to [facility] with a extensive psychiatric background as well as a history of ETOH abuse.   During this admission, patient is alert and oriented x4, calm, cooperative. Patient is very pleasant on approach. He appears to bo interacting with both staff and peers well and no behavioral issues have been reported or observed. He endorses no withdrawal symptoms at this time. Continues to deny any thoughts of wanting to hurt himself or thoughts of suicide.  Denies HI or AVH and does not appear to be internally preoccupied. Denies depressive symptoms and continues to endorse some anxiety secondary situational factors. He continues to take medication as prescribed and denies medication related side effect or adverse events. He is able to verbalize a plan once discharge and continues to endorse the desire for continued treatment for his substance use. Continued to encourage inpatient treatment for substance use due to his extensive background of  ETOH use however patient very reluctant.  Endorses appetite as fair and sleeping pattern as fair. He remains able to contract and maintain safety on the unit at this time      Principal Problem: Bipolar 1 disorder (HCC) Diagnosis:   Patient Active Problem List   Diagnosis Date Noted  . Suicide attempt (HCC) [T14.91XA] 01/02/2017  . Amitriptyline overdose [T43.011A] 12/31/2016  . Substance induced mood disorder (HCC) [F19.94] 12/21/2016  . Polysubstance dependence (HCC) [F19.20] 12/21/2016  . Bipolar affective disorder, depressed, severe (HCC) [F31.4] 08/07/2016  . Bipolar 1 disorder (HCC) [F31.9] 12/16/2015  . Alcohol  dependence with alcohol-induced mood disorder (HCC) [F10.24]   . Alcohol use disorder, severe, dependence (HCC) [F10.20] 12/15/2015  . Bipolar I disorder, most recent episode depressed (HCC) [F31.30] 05/15/2015  . Dog bite of left calf [S81.852A, W54.0XXA] 06/19/2014  . Alcohol abuse [F10.10] 06/18/2014  . Alcohol withdrawal (HCC) [F10.239] 06/18/2014  . Alcohol intoxication, episodic (HCC) [F10.129] 06/18/2014  . Toxic encephalopathy [G92] 06/18/2014  . Leukocytosis [D72.829]   . Polysubstance abuse [F19.10]   . CLOSED FRACTURE OF NECK OF METACARPAL BONE [S62.339A] 06/16/2010   Total Time spent with patient: 20 minutes  Past Psychiatric History:  Long history of substance use. Has been admitted fivetimes and last admission was 08/07/2016. All related to violent behavior while intoxicated. Has been on tried on Depakote in the past. Says it was helpful and he wants to take it again. Has had suicidal thoughts while under the influence. No past suicidal behavior. His gun was taken away by the police during one of his violent episodes. Patient currently receives medications through [facility]. Current medications are trazodone 100 mg at bedtime, naltrexone 50 mg daily, gabapentin 300 mg 3 times daily, Depakote ER thousand milligrams at bedtime    Past Medical History:  Past Medical History:  Diagnosis Date  . Bipolar 1 disorder [facility])     Past Surgical History:  Procedure Laterality Date  . KNEE SURGERY Right    Family History: History reviewed. No pertinent family history. Family Psychiatric  History: Strong family history of addiction in both sides of the family Social History:  History  Alcohol Use  . Yes    Comment: Pint  or more liquor daily     History  Drug Use  . Types: Marijuana, Benzodiazepines, MDMA (Ecstacy)    Social History   Social History  . Marital status: Single    Spouse name: N/A  . Number of children: N/A  . Years of education: N/A   Social History Main  Topics  . Smoking status: Current Some Day Smoker    Packs/day: 0.50    Types: Cigarettes  . Smokeless tobacco: Never Used  . Alcohol use Yes     Comment: Pint or more liquor daily  . Drug use: Yes    Types: Marijuana, Benzodiazepines, MDMA (Ecstacy)  . Sexual activity: No   Other Topics Concern  . None   Social History Narrative  . None   Additional Social History:    Pain Medications: see PTA meds Prescriptions: see PTA meds Over the Counter: see PTA meds History of alcohol / drug use?: Yes        Sleep: Fair  Appetite:  Fair  Current Medications: Current Facility-Administered Medications  Medication Dose Route Frequency Provider Last Rate Last Dose  . acetaminophen (TYLENOL) tablet 650 mg  650 mg Oral Q6H PRN Laveda Abbe, NP      . alum & mag hydroxide-simeth (MAALOX/MYLANTA) 200-200-20 MG/5ML suspension 30 mL  30 mL Oral Q4H PRN Laveda Abbe, NP      . atorvastatin (LIPITOR) tablet 20 mg  20 mg Oral q1800 Denzil Magnuson, NP   20 mg at 01/05/17 1610  . haloperidol (HALDOL) tablet 5 mg  5 mg Oral Q6H PRN Laveda Abbe, NP       And  . benztropine (COGENTIN) tablet 1 mg  1 mg Oral Q6H PRN Laveda Abbe, NP      . chlordiazePOXIDE (LIBRIUM) capsule 25 mg  25 mg Oral Q6H PRN Sanjuana Kava, NP      . Melene Muller ON 01/06/2017] chlordiazePOXIDE (LIBRIUM) capsule 25 mg  25 mg Oral Daily Nwoko, Agnes I, NP      . divalproex (DEPAKOTE ER) 24 hr tablet 1,000 mg  1,000 mg Oral QHS Laveda Abbe, NP   1,000 mg at 01/04/17 2209  . gabapentin (NEURONTIN) capsule 300 mg  300 mg Oral TID Laveda Abbe, NP   300 mg at 01/05/17 9604  . hydrOXYzine (ATARAX/VISTARIL) tablet 25 mg  25 mg Oral Q6H PRN Armandina Stammer I, NP   25 mg at 01/04/17 2209  . loperamide (IMODIUM) capsule 2-4 mg  2-4 mg Oral PRN Nwoko, Agnes I, NP      . magnesium hydroxide (MILK OF MAGNESIA) suspension 30 mL  30 mL Oral Daily PRN Laveda Abbe, NP      .  menthol-cetylpyridinium (CEPACOL) lozenge 3 mg  1 lozenge Oral PRN Laveda Abbe, NP      . multivitamin with minerals tablet 1 tablet  1 tablet Oral Daily Armandina Stammer I, NP   1 tablet at 01/05/17 5409  . naltrexone (DEPADE) tablet 50 mg  50 mg Oral Daily Laveda Abbe, NP   50 mg at 01/05/17 8119  . omega-3 acid ethyl esters (LOVAZA) capsule 1 g  1 g Oral BID Denzil Magnuson, NP   1 g at 01/05/17 1478  . ondansetron (ZOFRAN-ODT) disintegrating tablet 4 mg  4 mg Oral Q6H PRN Nwoko, Agnes I, NP      . thiamine (VITAMIN B-1) tablet 100 mg  100 mg Oral Daily Armandina Stammer I, NP   100  mg at 01/05/17 0833  . traZODone (DESYREL) tablet 100 mg  100 mg Oral QHS Laveda AbbeParks, Laurie Britton, NP   100 mg at 01/04/17 2209    Lab Results:  Results for orders placed or performed during the hospital encounter of 01/02/17 (from the past 48 hour(s))  Valproic acid level     Status: None   Collection Time: 01/05/17  6:09 AM  Result Value Ref Range   Valproic Acid Lvl 53 50.0 - 100.0 ug/mL    Comment: Performed at [facility], [address] [place], [place] [address]    Blood Alcohol level:  Lab Results  Component Value Date   ETH 185 (H) 12/30/2016   ETH 239 (H) 12/20/2016    Metabolic Disorder Labs: Lab Results  Component Value Date   HGBA1C 4.9 01/03/2017   MPG 94 01/03/2017   MPG 91 12/17/2015   No results found for: PROLACTIN Lab Results  Component Value Date   CHOL 289 (H) 01/03/2017   TRIG 208 (H) 01/03/2017   HDL 54 01/03/2017   CHOLHDL 5.4 01/03/2017   VLDL 42 (H) 01/03/2017   LDLCALC 193 (H) 01/03/2017   LDLCALC 148 (H) 12/17/2015    Physical Findings: AIMS: Facial and Oral Movements Muscles of Facial Expression: None, normal Lips and Perioral Area: None, normal Jaw: None, normal Tongue: None, normal,Extremity Movements Upper (arms, wrists, hands, fingers): None, normal Lower (legs, knees, ankles, toes): None, normal, Trunk Movements Neck,  shoulders, hips: None, normal, Overall Severity Severity of abnormal movements (highest score from questions above): None, normal Incapacitation due to abnormal movements: None, normal Patient's awareness of abnormal movements (rate only patient's report): No Awareness, Dental Status Current problems with teeth and/or dentures?: No Does patient usually wear dentures?: No  CIWA:  CIWA-Ar Total: 1 COWS:     Musculoskeletal: Strength & Muscle Tone: within normal limits Gait & Station: normal Patient leans: N/A  Psychiatric Specialty Exam: Physical Exam  Nursing note and vitals reviewed. Constitutional: He is oriented to person, place, and time.  Neurological: He is alert and oriented to person, place, and time.    Review of Systems  Psychiatric/Behavioral: Positive for substance abuse. Negative for depression, hallucinations, memory loss and suicidal ideas. The patient is nervous/anxious. The patient does not have insomnia.   All other systems reviewed and are negative.   Blood pressure 106/79, pulse 79, temperature 98.4 F (36.9 C), temperature source Oral, resp. rate 18, height 5\' 9"  (1.753 m), weight 205 lb (93 kg), SpO2 98 %.Body mass index is 30.27 kg/m.  General Appearance: Fairly Groomed  Eye Contact:  Good  Speech:  Clear and Coherent and Normal Rate  Volume:  Normal  Mood:  Euthymic  Affect:  Appropriate  Thought Process:  Coherent, Linear and Descriptions of Associations: Intact  Orientation:  Full (Time, Place, and Person)  Thought Content:  Logical denies AVH, no preoccupations, no ruminations   Suicidal Thoughts:  No  Homicidal Thoughts:  No  Memory:  Negative Immediate;   Fair Recent;   Fair  Judgement:  Fair  Insight:  Fair  Psychomotor Activity:  Normal  Concentration:  Concentration: Fair and Attention Span: Fair  Recall:  [employer]Fair  Fund of Knowledge:  Fair  Language:  Good  Akathisia:  Negative  Handed:  Right  AIMS (if indicated):     Assets:  Desire for  Improvement Resilience  ADL's:  Intact  Cognition:  WNL  Sleep:  Number of Hours: 6.75     Treatment  Plan Summary: Daily contact with patient to assess and evaluate symptoms and progress in treatment   Medication management: Patient endorses mood has improved. He denies withdrawal symptoms at this time.  Denies AVH, SI, HI. To continue  reduce current symptoms to base line and improve the patient's overall level of functioning will continue the following treatment plan without adjustments;   Continue Depakote ER 1000 mg po daily at bedtime for mood disorder . Continue Trazodone 100 mg at bedtime for insomnia  Continue naltrexone 50 mg daily for substance abuse   Continue gabapentin 300 mg 3 times daily for anxiety/agitation   Continue librium protocol for withdrawal symptoms.?  Start Lipitor 20 mg po daily at 1800 for hyperlipidemia and Lovaza 1 g po bid   Other:  Safety: Will continue 15 minute observation for safety checks. Patient is able to contract for safety on the unit at this time  Labs: Valproic level 53.  Continue to develop treatment plan to decrease risk of relapse upon discharge and to reduce the need for readmission.  Psycho-social education regarding relapse prevention and self care.  Health care follow up as needed for medical problems.  Continue to attend and participate in therapy.   CSW will continue to work on discharge planning. Will contnue to encourage inpatient treatment for substance abuse after discharged.   Denzil MagnusonLaShunda Saman Giddens, NP 01/05/2017, 12:22 PM   Patient ID: Adam DanBrandon J Boesel, male   DOB: February 28, 1991, 26 y.o.   MRN: [id number]

## 2017-01-05 NOTE — [facility] Group Notes (Signed)
[facility] LCSW Group Therapy Note  01/05/2017  and  10:00 AM  Type of Therapy and Topic:  Group Therapy: Avoiding Self-Sabotaging and Enabling Behaviors  Participation Level:  Did Not Attend; invited to participate yet did not despite overhead announcement and encouragement by writer.   Summary of Progress/Problems:  The main focus of today's process group was for the patient to identify ways in which they have in the past sabotaged their own recovery. Motivational Interviewing was utilized to identify motivation they may have for wanting to change. The Stages of Change were explained using a handout, and patients identified where they currently are with regard to stages of change.    Adam Novak C Sal Spratley, LCSW    

## 2017-01-05 NOTE — Progress Notes (Signed)
Patient did not attend the evening speaker AA meeting. Pt was notified that group was beginning but returned to room.

## 2017-01-05 NOTE — [facility] Group Notes (Signed)
The Power of Vulnerbility Date:  01/05/2017  Time:  1300  Type of Therapy:  Nurse Education  /  The Power of Vulnerability  ;  The group  Is focused around the discussion that entails after the group watches the 20 minute You Tube Video togethere.   Participation Level:  Did Not Attend  Participation Quality:    Affect:    Cognitive:    Insight:    Engagement in Group:    Modes of Intervention:    Summary of Progress/Problems:  Adam Novak, Adam Novak Lynn 01/05/2017, 5:04 PM

## 2017-01-06 ENCOUNTER — Encounter (HOSPITAL_COMMUNITY): Payer: Self-pay | Admitting: Behavioral Health

## 2017-01-06 NOTE — Progress Notes (Signed)
[facility] MD Progress Note  01/06/2017 9:56 AM Adam Novak  MRN:  [id number]   Subjective:  " I am doing great. Was hoping I could get out of here today."  Objective: Face to face evaluation completed, case discussed with treatment team, chart reviewed. Patient was   admitted to [facility] status post overdose. He presents to [facility] with a extensive psychiatric background as well as a history of ETOH abuse.   During this admission, patient is alert and oriented x4, calm, cooperative. Patient continues to show improvement in psychiatric condition/symtpoms. He continues to do well on the unit without behavioral issues reported or observed. He continues to engage well with peers and staff and remains complaint with therapeutic milieu. He continues to endorse no withdrawal symptoms. Continues to deny any thoughts of wanting to hurt himself or thoughts of suicide.  Denies HI or AVH and does not appear to be internally preoccupied. Denies depressive symptoms and continues to endorse mild anxiety. Remains complaint with medication administration and reports medications are well tolerated and without side effects. He is able to verbalize a plan once discharge in which he will live with his sister. He reports his parents came yesterday and the visit did no go well because his father drinks alcohol and his father is unwilling to stop or accept the fact that he has a problem (as per patient report). Endorses appetite as good and sleeping pattern as good. He remains able to contract and maintain safety on the unit at this time      Principal Problem: Bipolar 1 disorder (HCC) Diagnosis:   Patient Active Problem List   Diagnosis Date Noted  . Suicide attempt (HCC) [T14.91XA] 01/02/2017  . Amitriptyline overdose [T43.011A] 12/31/2016  . Substance induced mood disorder (HCC) [F19.94] 12/21/2016  . Polysubstance dependence (HCC) [F19.20] 12/21/2016  . Bipolar affective disorder, depressed, severe (HCC) [F31.4]  08/07/2016  . Bipolar 1 disorder (HCC) [F31.9] 12/16/2015  . Alcohol dependence with alcohol-induced mood disorder (HCC) [F10.24]   . Alcohol use disorder, severe, dependence (HCC) [F10.20] 12/15/2015  . Bipolar I disorder, most recent episode depressed (HCC) [F31.30] 05/15/2015  . Dog bite of left calf [S81.852A, W54.0XXA] 06/19/2014  . Alcohol abuse [F10.10] 06/18/2014  . Alcohol withdrawal (HCC) [F10.239] 06/18/2014  . Alcohol intoxication, episodic (HCC) [F10.129] 06/18/2014  . Toxic encephalopathy [G92] 06/18/2014  . Leukocytosis [D72.829]   . Polysubstance abuse [F19.10]   . CLOSED FRACTURE OF NECK OF METACARPAL BONE [S62.339A] 06/16/2010   Total Time spent with patient: 20 minutes  Past Psychiatric History:  Long history of substance use. Has been admitted fivetimes and last admission was 08/07/2016. All related to violent behavior while intoxicated. Has been on tried on Depakote in the past. Says it was helpful and he wants to take it again. Has had suicidal thoughts while under the influence. No past suicidal behavior. His gun was taken away by the police during one of his violent episodes. Patient currently receives medications through [facility]. Current medications are trazodone 100 mg at bedtime, naltrexone 50 mg daily, gabapentin 300 mg 3 times daily, Depakote ER thousand milligrams at bedtime    Past Medical History:  Past Medical History:  Diagnosis Date  . Bipolar 1 disorder [facility])     Past Surgical History:  Procedure Laterality Date  . KNEE SURGERY Right    Family History: History reviewed. No pertinent family history. Family Psychiatric  History: Strong family history of addiction in both sides of the family Social History:  History  Alcohol Use  . Yes    Comment: Pint or more liquor daily     History  Drug Use  . Types: Marijuana, Benzodiazepines, MDMA (Ecstacy)    Social History   Social History  . Marital status: Single    Spouse name: N/A  . Number of  children: N/A  . Years of education: N/A   Social History Main Topics  . Smoking status: Current Some Day Smoker    Packs/day: 0.50    Types: Cigarettes  . Smokeless tobacco: Never Used  . Alcohol use Yes     Comment: Pint or more liquor daily  . Drug use: Yes    Types: Marijuana, Benzodiazepines, MDMA (Ecstacy)  . Sexual activity: No   Other Topics Concern  . None   Social History Narrative  . None   Additional Social History:    Pain Medications: see PTA meds Prescriptions: see PTA meds Over the Counter: see PTA meds History of alcohol / drug use?: Yes        Sleep: Good  Appetite:  Good  Current Medications: Current Facility-Administered Medications  Medication Dose Route Frequency Provider Last Rate Last Dose  . acetaminophen (TYLENOL) tablet 650 mg  650 mg Oral Q6H PRN Laveda AbbeParks, Laurie Britton, NP      . alum & mag hydroxide-simeth (MAALOX/MYLANTA) 200-200-20 MG/5ML suspension 30 mL  30 mL Oral Q4H PRN Laveda AbbeParks, Laurie Britton, NP      . atorvastatin (LIPITOR) tablet 20 mg  20 mg Oral q1800 Denzil Magnusonhomas, Rey Dansby, NP   20 mg at 01/05/17 1817  . haloperidol (HALDOL) tablet 5 mg  5 mg Oral Q6H PRN Laveda AbbeParks, Laurie Britton, NP   5 mg at 01/05/17 2215   And  . benztropine (COGENTIN) tablet 1 mg  1 mg Oral Q6H PRN Laveda AbbeParks, Laurie Britton, NP   1 mg at 01/05/17 2215  . chlordiazePOXIDE (LIBRIUM) capsule 25 mg  25 mg Oral Daily Nwoko, Agnes I, NP      . divalproex (DEPAKOTE ER) 24 hr tablet 1,000 mg  1,000 mg Oral QHS Laveda AbbeParks, Laurie Britton, NP   1,000 mg at 01/05/17 2215  . gabapentin (NEURONTIN) capsule 300 mg  300 mg Oral TID Laveda AbbeParks, Laurie Britton, NP   300 mg at 01/06/17 0839  . magnesium hydroxide (MILK OF MAGNESIA) suspension 30 mL  30 mL Oral Daily PRN Laveda AbbeParks, Laurie Britton, NP      . menthol-cetylpyridinium (CEPACOL) lozenge 3 mg  1 lozenge Oral PRN Laveda AbbeParks, Laurie Britton, NP      . multivitamin with minerals tablet 1 tablet  1 tablet Oral Daily Armandina StammerNwoko, Agnes I, NP   1 tablet at  01/06/17 [id number]  . naltrexone (DEPADE) tablet 50 mg  50 mg Oral Daily Laveda AbbeParks, Laurie Britton, NP   50 mg at 01/06/17 0839  . omega-3 acid ethyl esters (LOVAZA) capsule 1 g  1 g Oral BID Denzil Magnusonhomas, Maeryn Mcgath, NP   1 g at 01/06/17 [id number]  . thiamine (VITAMIN B-1) tablet 100 mg  100 mg Oral Daily Armandina StammerNwoko, Agnes I, NP   100 mg at 01/06/17 0839  . traZODone (DESYREL) tablet 100 mg  100 mg Oral QHS Laveda AbbeParks, Laurie Britton, NP   100 mg at 01/05/17 2215    Lab Results:  Results for orders placed or performed during the hospital encounter of 01/02/17 (from the past 48 hour(s))  Valproic acid level     Status: None   Collection Time: 01/05/17  6:09 AM  Result Value Ref Range  Valproic Acid Lvl 53 50.0 - 100.0 ug/mL    Comment: Performed at [facility], [address], [place], [place] [address]    Blood Alcohol level:  Lab Results  Component Value Date   ETH 185 (H) 12/30/2016   ETH 239 (H) 12/20/2016    Metabolic Disorder Labs: Lab Results  Component Value Date   HGBA1C 4.9 01/03/2017   MPG 94 01/03/2017   MPG 91 12/17/2015   No results found for: PROLACTIN Lab Results  Component Value Date   CHOL 289 (H) 01/03/2017   TRIG 208 (H) 01/03/2017   HDL 54 01/03/2017   CHOLHDL 5.4 01/03/2017   VLDL 42 (H) 01/03/2017   LDLCALC 193 (H) 01/03/2017   LDLCALC 148 (H) 12/17/2015    Physical Findings: AIMS: Facial and Oral Movements Muscles of Facial Expression: None, normal Lips and Perioral Area: None, normal Jaw: None, normal Tongue: None, normal,Extremity Movements Upper (arms, wrists, hands, fingers): None, normal Lower (legs, knees, ankles, toes): None, normal, Trunk Movements Neck, shoulders, hips: None, normal, Overall Severity Severity of abnormal movements (highest score from questions above): None, normal Incapacitation due to abnormal movements: None, normal Patient's awareness of abnormal movements (rate only patient's report): No Awareness, Dental Status Current  problems with teeth and/or dentures?: No Does patient usually wear dentures?: No  CIWA:  CIWA-Ar Total: 1 COWS:     Musculoskeletal: Strength & Muscle Tone: within normal limits Gait & Station: normal Patient leans: N/A  Psychiatric Specialty Exam: Physical Exam  Nursing note and vitals reviewed. Constitutional: He is oriented to person, place, and time.  Neurological: He is alert and oriented to person, place, and time.    Review of Systems  Psychiatric/Behavioral: Positive for substance abuse. Negative for depression, hallucinations, memory loss and suicidal ideas. The patient is nervous/anxious. The patient does not have insomnia.   All other systems reviewed and are negative.   Blood pressure (!) 131/99, pulse (!) 102, temperature 98 F (36.7 C), temperature source Oral, resp. rate 18, height 5\' 9"  (1.753 m), weight 205 lb (93 kg), SpO2 98 %.Body mass index is 30.27 kg/m.  General Appearance: Fairly Groomed  Eye Contact:  Good  Speech:  Clear and Coherent and Normal Rate  Volume:  Normal  Mood:  Euthymic  Affect:  Appropriate  Thought Process:  Coherent, Linear and Descriptions of Associations: Intact  Orientation:  Full (Time, Place, and Person)  Thought Content:  Logical denies AVH, no preoccupations, no ruminations   Suicidal Thoughts:  No  Homicidal Thoughts:  No  Memory:  Negative Immediate;   Fair Recent;   Fair  Judgement:  Fair  Insight:  Fair  Psychomotor Activity:  Normal  Concentration:  Concentration: Fair and Attention Span: Fair  Recall:  [employer] of Knowledge:  Fair  Language:  Good  Akathisia:  Negative  Handed:  Right  AIMS (if indicated):     Assets:  Desire for Improvement Resilience  ADL's:  Intact  Cognition:  WNL  Sleep:  Number of Hours: 5.5     Treatment Plan Summary: Daily contact with patient to assess and evaluate symptoms and progress in treatment   Medication management: Continues to show improvement. Denies withdrawal  symptoms at this time.  Denies AVH, SI, HI. To continue reduce current symptoms to base line and improve the patient's overall level of functioning will continue the following treatment plan without adjustments;   Continue Depakote ER 1000 mg po daily at bedtime for mood disorder .  Continue Trazodone 100 mg at bedtime for insomnia  Continue naltrexone 50 mg daily for substance abuse   Continue gabapentin 300 mg 3 times daily for anxiety/agitation   Continue librium protocol for withdrawal symptoms.?  Continue Lipitor 20 mg po daily at 1800 for hyperlipidemia and Lovaza 1 g po bid   Other:  Safety: Will continue 15 minute observation for safety checks. Patient is able to contract for safety on the unit at this time  Labs: No new labs resulted.  Continue to develop treatment plan to decrease risk of relapse upon discharge and to reduce the need for readmission.  Psycho-social education regarding relapse prevention and self care.  Health care follow up as needed for medical problems.  Continue to attend and participate in therapy.   CSW will continue to work on discharge planning. Will contnue to encourage inpatient treatment for substance abuse after discharged.   Denzil MagnusonLaShunda Mattia Osterman, NP 01/06/2017, 9:56 AM   Patient ID: Adam DanBrandon J Streater, male   DOB: 06-17-1990, 26 y.o.   MRN: [id number]

## 2017-01-06 NOTE — [facility] Group Notes (Addendum)
Identifying Needs   Date:  01/06/2017  Time:  1300  Type of Therapy:  Nurse Education The group focuses on teaching patients how to identify their needs and then how to develop skills needed to get them met.   Participation Level:  Active  Participation Quality:  Attentive  Affect:  Appropriate  Cognitive:  Alert  Insight:  Improving  Engagement in Group:  Engaged  Modes of Intervention:  Education  Summary of Progress/Problems:  Lauralyn Primes 01/06/2017, 4:50 PM

## 2017-01-06 NOTE — Progress Notes (Signed)
D Adam JunesBrandon is seen OOB UAL on the 300 hall today..he tolerates this fairly well. He is pleasant and cooperative and he takes his scheduled meds as planned ( except he refused his librium this morning at 0800, saying " I dont need that".A He completed his daily assessment and on this he wrote he deneid SI today and he rated his depression, hopelessness and anxiety " 0/0/2", respectively. He states he wants to go home, is anxious about his discharge and is " not quite sure" about how itll be at his sisters house when he goes home. R Safety is in place and poc cont.

## 2017-01-06 NOTE — [facility] Group Notes (Addendum)
[facility] LCSW Group Therapy  01/06/2017 10:00 AM  Type of Therapy:  Group Therapy  Participation Level:  Did Not Attend; invited to participate yet did not despite overhead announcement and encouragement by staff  Summary of Progress/Problems: Topic for today was thoughts, feelings and emotions related to what may lie behind anger. Patient's had opportunity to identify with feelings such as: Jealousy, Hurt, Anxiety, Shame, Sadness, Fear, Frustration, Guilt, Disappointment, Worry, and Embarrassment. Group processed discrepancy and difficulty in admitting to underlying feelings. P   Sharbel Sahagun C Linh Johannes, LCSW     

## 2017-01-07 ENCOUNTER — Encounter (HOSPITAL_COMMUNITY): Payer: Self-pay | Admitting: Behavioral Health

## 2017-01-07 DIAGNOSIS — F1490 Cocaine use, unspecified, uncomplicated: Secondary | ICD-10-CM

## 2017-01-07 MED ORDER — GABAPENTIN 300 MG PO CAPS: 300.0000 mg | ORAL_CAPSULE | Freq: Three times a day (TID) | ORAL | 0 refills | Status: DC

## 2017-01-07 MED ORDER — ATORVASTATIN CALCIUM 20 MG PO TABS: 20.0000 mg | ORAL_TABLET | Freq: Every day | ORAL | 0 refills | Status: DC

## 2017-01-07 MED ORDER — TRAZODONE HCL 100 MG PO TABS: 100.0000 mg | ORAL_TABLET | Freq: Every day | ORAL | 0 refills | Status: DC

## 2017-01-07 MED ORDER — DIVALPROEX SODIUM ER 500 MG PO TB24: 1000.0000 mg | ORAL_TABLET | Freq: Every day | ORAL | 0 refills | Status: DC

## 2017-01-07 MED ORDER — FOLIC ACID 1 MG PO TABS
1.0000 mg | ORAL_TABLET | Freq: Every day | ORAL | Status: DC
  Filled 2017-01-07 (×2): qty 7

## 2017-01-07 MED ORDER — OMEGA-3-ACID ETHYL ESTERS 1 G PO CAPS: 1.0000 g | ORAL_CAPSULE | Freq: Two times a day (BID) | ORAL | 0 refills | Status: DC

## 2017-01-07 NOTE — Tx Team (Signed)
Interdisciplinary Treatment and Diagnostic Plan Update  01/07/2017 Time of Session: 1610RU Adam Novak MRN: [id number]  Principal Diagnosis: Bipolar 1 disorder [facility])  Secondary Diagnoses: Principal Problem:   Bipolar 1 disorder ([place]) Active Problems:   Suicide attempt [facility])   Current Medications:  Current Facility-Administered Medications  Medication Dose Route Frequency Provider Last Rate Last Dose  . acetaminophen (TYLENOL) tablet 650 mg  650 mg Oral Q6H PRN Ethelene Hal, NP      . alum & mag hydroxide-simeth (MAALOX/MYLANTA) 200-200-20 MG/5ML suspension 30 mL  30 mL Oral Q4H PRN Ethelene Hal, NP      . atorvastatin (LIPITOR) tablet 20 mg  20 mg Oral q1800 Mordecai Maes, NP   20 mg at 01/06/17 1723  . haloperidol (HALDOL) tablet 5 mg  5 mg Oral Q6H PRN Ethelene Hal, NP   5 mg at 01/06/17 2144   And  . benztropine (COGENTIN) tablet 1 mg  1 mg Oral Q6H PRN Ethelene Hal, NP   1 mg at 01/06/17 2145  . divalproex (DEPAKOTE ER) 24 hr tablet 1,000 mg  1,000 mg Oral QHS Ethelene Hal, NP   1,000 mg at 01/06/17 2144  . gabapentin (NEURONTIN) capsule 300 mg  300 mg Oral TID Ethelene Hal, NP   300 mg at 01/07/17 9147  . magnesium hydroxide (MILK OF MAGNESIA) suspension 30 mL  30 mL Oral Daily PRN Ethelene Hal, NP      . menthol-cetylpyridinium (CEPACOL) lozenge 3 mg  1 lozenge Oral PRN Ethelene Hal, NP      . multivitamin with minerals tablet 1 tablet  1 tablet Oral Daily Lindell Spar I, NP   1 tablet at 01/07/17 8295  . naltrexone (DEPADE) tablet 50 mg  50 mg Oral Daily Ethelene Hal, NP   50 mg at 01/07/17 6213  . omega-3 acid ethyl esters (LOVAZA) capsule 1 g  1 g Oral BID Mordecai Maes, NP   1 g at 01/07/17 0865  . thiamine (VITAMIN B-1) tablet 100 mg  100 mg Oral Daily Lindell Spar I, NP   100 mg at 01/07/17 0823  . traZODone (DESYREL) tablet 100 mg  100 mg Oral QHS Ethelene Hal, NP   100 mg  at 01/06/17 2144   PTA Medications: Prescriptions Prior to Admission  Medication Sig Dispense Refill Last Dose  . divalproex (DEPAKOTE ER) 500 MG 24 hr tablet Take 2 tablets (1,000 mg total) by mouth at bedtime. 60 tablet 0 Past Week at Unknown time  . famotidine (PEPCID) 20 MG tablet Take 1 tablet (20 mg total) by mouth daily.     . folic acid (FOLVITE) 1 MG tablet Take 1 tablet (1 mg total) by mouth daily.  3   . gabapentin (NEURONTIN) 300 MG capsule Take 1 capsule (300 mg total) by mouth 3 (three) times daily. 90 capsule 0 Past Week at Unknown time  . Multiple Vitamin (MULTIVITAMIN WITH MINERALS) TABS tablet Take 1 tablet by mouth daily.   Past Week at Unknown time  . naltrexone (DEPADE) 50 MG tablet Take 1 tablet (50 mg total) by mouth daily. 30 tablet 0 Past Week at Unknown time  . phenol (CHLORASEPTIC) 1.4 % LIQD Use as directed 1 spray in the mouth or throat as needed for throat irritation / pain.  0   . thiamine (VITAMIN B-1) 100 MG tablet Take 1 tablet (100 mg total) by mouth daily.     . traZODone (DESYREL) 100  MG tablet Take 1 tablet (100 mg total) by mouth at bedtime. 30 tablet 0 Past Week at Unknown time    Patient Stressors: Financial difficulties Legal issue Medication change or noncompliance Occupational concerns Substance abuse  Patient Strengths: Curator fund of knowledge Physical Health  Treatment Modalities: Medication Management, Group therapy, Case management,  1 to 1 session with clinician, Psychoeducation, Recreational therapy.   Physician Treatment Plan for Primary Diagnosis: Bipolar 1 disorder ([place]) Long Term Goal(s): Improvement in symptoms so as ready for discharge Improvement in symptoms so as ready for discharge   Short Term Goals: Ability to identify changes in lifestyle to reduce recurrence of condition will improve Ability to verbalize feelings will improve Compliance with prescribed medications will improve Ability to  identify triggers associated with substance abuse/mental health issues will improve Ability to disclose and discuss suicidal ideas Ability to identify and develop effective coping behaviors will improve  Medication Management: Evaluate patient's response, side effects, and tolerance of medication regimen.  Therapeutic Interventions: 1 to 1 sessions, Unit Group sessions and Medication administration.  Evaluation of Outcomes: Adequate for discharge   Physician Treatment Plan for Secondary Diagnosis: Principal Problem:   Bipolar 1 disorder ([place]) Active Problems:   Suicide attempt ([place])  Long Term Goal(s): Improvement in symptoms so as ready for discharge Improvement in symptoms so as ready for discharge   Short Term Goals: Ability to identify changes in lifestyle to reduce recurrence of condition will improve Ability to verbalize feelings will improve Compliance with prescribed medications will improve Ability to identify triggers associated with substance abuse/mental health issues will improve Ability to disclose and discuss suicidal ideas Ability to identify and develop effective coping behaviors will improve     Medication Management: Evaluate patient's response, side effects, and tolerance of medication regimen.  Therapeutic Interventions: 1 to 1 sessions, Unit Group sessions and Medication administration.  Evaluation of Outcomes: Adequate for discharge   RN Treatment Plan for Primary Diagnosis: Bipolar 1 disorder ([place]) Long Term Goal(s): Knowledge of disease and therapeutic regimen to maintain health will improve  Short Term Goals: Ability to remain free from injury will improve, Ability to disclose and discuss suicidal ideas and Ability to identify and develop effective coping behaviors will improve  Medication Management: RN will administer medications as ordered by provider, will assess and evaluate patient's response and provide education to patient for prescribed  medication. RN will report any adverse and/or side effects to prescribing provider.  Therapeutic Interventions: 1 on 1 counseling sessions, Psychoeducation, Medication administration, Evaluate responses to treatment, Monitor vital signs and CBGs as ordered, Perform/monitor CIWA, COWS, AIMS and Fall Risk screenings as ordered, Perform wound care treatments as ordered.  Evaluation of Outcomes: Adequate for discharge    LCSW Treatment Plan for Primary Diagnosis: Bipolar 1 disorder ([place]) Long Term Goal(s): Safe transition to appropriate next level of care at discharge, Engage patient in therapeutic group addressing interpersonal concerns.  Short Term Goals: Engage patient in aftercare planning with referrals and resources, Facilitate patient progression through stages of change regarding substance use diagnoses and concerns and Identify triggers associated with mental health/substance abuse issues  Therapeutic Interventions: Assess for all discharge needs, 1 to 1 time with Social worker, Explore available resources and support systems, Assess for adequacy in community support network, Educate family and significant other(s) on suicide prevention, Complete Psychosocial Assessment, Interpersonal group therapy.  Evaluation of Outcomes: Adequate for discharge   Progress in Treatment: Attending groups: Yes Participating in groups: Yes Taking medication as  prescribed: Yes. Toleration medication: Yes. Family/Significant other contact made: SPE completed with pt's sister.  Patient understands diagnosis: Yes. Discussing patient identified problems/goals with staff: Yes. Medical problems stabilized or resolved: Yes. Denies suicidal/homicidal ideation: Yes. Issues/concerns per patient self-inventory: No. Other: n/a   New problem(s) identified: No, N/A  New Short Term/Long Term Goal(s): medication management for mood stabilization; detox; development of comprehensive mental wellness/sobriety plan.    Discharge Plan or Barriers: Pt will be moving into his sister's home at discharge. [facility] for outpatient services-appt made for 8/1. [place] pamphlet also provided for additional community support. Peer support specialist met with pt during his stay at the hospital.   Reason for Continuation of Hospitalization: none  Estimated Length of Stay: Monday, 01/07/17  Attendees: Patient: 01/07/2017 9:53 AM  Physician: Dr. Dwyane Dee MD; Dr. Parke Poisson MD 01/07/2017 9:53 AM  Nursing: Vallery Ridge; Christa RN 01/07/2017 9:53 AM  RN Care Manager: Lars Pinks CM 01/07/2017 9:53 AM  Social Worker: Maxie Better, LCSW 01/07/2017 9:53 AM  Recreational Therapist: x 01/07/2017 9:53 AM  Other: Mordecai Maes NP; Marvia Pickles NP; Ricky Ala NP 01/07/2017 9:53 AM  Other:  01/07/2017 9:53 AM  Other: 01/07/2017 9:53 AM    Scribe for Treatment Team: [place], LCSW 01/07/2017 9:53 AM

## 2017-01-07 NOTE — Discharge Summary (Signed)
Physician Discharge Summary Note  Patient:  Adam Novak is an 26 y.o., male MRN:  [id number] DOB:  11-30-1990 Patient phone:  [phone] (home)  Patient address:   [address] [place] [id number],  Total Time spent with patient: 30 minutes  Date of Admission:  01/02/2017 Date of Discharge: 01/07/2017  Reason for Admission: Overdose/SA  Principal Problem: Bipolar 1 disorder [facility](HCC) Discharge Diagnoses: Patient Active Problem List   Diagnosis Date Noted  . Suicide attempt (HCC) [T14.91XA] 01/02/2017  . Amitriptyline overdose [T43.011A] 12/31/2016  . Substance induced mood disorder (HCC) [F19.94] 12/21/2016  . Polysubstance dependence (HCC) [F19.20] 12/21/2016  . Bipolar affective disorder, depressed, severe (HCC) [F31.4] 08/07/2016  . Bipolar 1 disorder (HCC) [F31.9] 12/16/2015  . Alcohol dependence with alcohol-induced mood disorder (HCC) [F10.24]   . Alcohol use disorder, severe, dependence (HCC) [F10.20] 12/15/2015  . Bipolar I disorder, most recent episode depressed (HCC) [F31.30] 05/15/2015  . Dog bite of left calf [S81.852A, W54.0XXA] 06/19/2014  . Alcohol abuse [F10.10] 06/18/2014  . Alcohol withdrawal (HCC) [F10.239] 06/18/2014  . Alcohol intoxication, episodic (HCC) [F10.129] 06/18/2014  . Toxic encephalopathy [G92] 06/18/2014  . Leukocytosis [D72.829]   . Polysubstance abuse [F19.10]   . CLOSED FRACTURE OF NECK OF METACARPAL BONE [S62.339A] 06/16/2010    Past Psychiatric History: Long history of substance use. Has been admitted fivetimes and last admission was 08/07/2016. All related to violent behavior while intoxicated. Has been on tried on Depakote in the past. Says it was helpful and he wants to take it again. Has had suicidal thoughts while under the influence. No past suicidal behavior. His gun was taken away by the police during one of his violent episodes. [facility]. Current medications are trazodone 100 mg  at bedtime, naltrexone 50 mg daily, gabapentin 300 mg 3 times daily, Depakote ER thousand milligrams at bedtime   Past Medical History:  Past Medical History:  Diagnosis Date  . Bipolar 1 disorder [facility](HCC)     Past Surgical History:  Procedure Laterality Date  . KNEE SURGERY Right    Family History: History reviewed. No pertinent family history. Family Psychiatric  History: Strong family history of addiction in both sides of the family Social History:  History  Alcohol Use  . Yes    Comment: Pint or more liquor daily     History  Drug Use  . Types: Marijuana, Benzodiazepines, MDMA (Ecstacy)    Social History   Social History  . Marital status: Single    Spouse name: N/A  . Number of children: N/A  . Years of education: N/A   Social History Main Topics  . Smoking status: Current Some Day Smoker    Packs/day: 0.50    Types: Cigarettes  . Smokeless tobacco: Never Used  . Alcohol use Yes     Comment: Pint or more liquor daily  . Drug use: Yes    Types: Marijuana, Benzodiazepines, MDMA (Ecstacy)  . Sexual activity: No   Other Topics Concern  . None   Social History Narrative  . None    Hospital Course:  26 year old [facility]. Patient acknowledges his reason for admission . He admits to intentionally overdosing on 20 amitriptyline's that belonged to his father. Patient has a significant psychiatric background and has been diagnosed with Bipolar. He reports this is [facility] or [facility]. [facility]. Patient endorses he was drinking all day the  day of the incident. He reports he had a verbal altercation with his father, ws feeling hopeles, and decide to kill himself. He reports after taking the medication he called his sister he encouraged him to throw the pills up. Reports he attempted to self-induce vomiting yet it did not work. Patient reports he remember nothing further but waking up in ICU. Patient required  ICU placement to stabilize and required cardiac monitoring during his ED course.  After the above admission assessment, Adam Novak's  presenting symptoms were identified. His UDS was positive for THC & benzodiazepines. Detoxification treatments administered as approproiate. He was medicated & discharged on; Depakote ER 1000 mg po daily at bedtime for mood disorder, Trazodone 100 mg at bedtime for insomnia, naltrexone 50 mg daily for substance abuse, gabapentin 300 mg 3 times daily for anxiety/agitation, Lipitor 20 mg po daily at 1800 for hyperlipidemia and Lovaza 1 g po bid.He was enrolled & actively  participated in the group counseling sessions. AA/NA meetings were offered & held on this unit and patient actively particpated. He was able to verbalize coping skills that should help him cope better to maintain depression/mood stability upon returning home.  During the course of his hospitalization, Adam Novak's improvement was monitored by observation and his daily report of symptom reduction. Evidence was further noted by  presentation of good affect and improved mood & behavior. Besides the mood stabilization treatments, Adam Novak's received no the medication regimen as he presented no other pre-existing medical issues. He tolerated his treatment regimen without any adverse effects reported.  Adam Novak's case was presented during treatment team meeting this morning. The team members all agreed that Adam Novak was both mentally & medically stable to be discharged to continue mental health care on an outpatient basis as noted below. He was provided with all the necessary information needed to make this appointment without problems.  Upon discharge, Adam Novak denied any SIHI, AVH, delusional thoughts or paranoia. He also denied any substance withdrawal symptoms. He was provided with a  prescription for his [facility] discharge medications to be taken to his local pharmacy as well as a 7 day sample of medications. He left [facility] with  all personal belongings in no apparent distress. Transportation per his arrangement.  Physical Findings: AIMS: Facial and Oral Movements Muscles of Facial Expression: None, normal Lips and Perioral Area: None, normal Jaw: None, normal Tongue: None, normal,Extremity Movements Upper (arms, wrists, hands, fingers): None, normal Lower (legs, knees, ankles, toes): None, normal, Trunk Movements Neck, shoulders, hips: None, normal, Overall Severity Severity of abnormal movements (highest score from questions above): None, normal Incapacitation due to abnormal movements: None, normal Patient's awareness of abnormal movements (rate only patient's report): No Awareness, Dental Status Current problems with teeth and/or dentures?: No Does patient usually wear dentures?: No  CIWA:  CIWA-Ar Total: 0 COWS:     Musculoskeletal: Strength & Muscle Tone: within normal limits Gait & Station: normal Patient leans: N/A  Psychiatric Specialty Exam: SEE SRA BY MD  Physical Exam  Nursing note and vitals reviewed. Constitutional: He is oriented to person, place, and time.  Neurological: He is alert and oriented to person, place, and time.    Review of Systems  Psychiatric/Behavioral: Negative for hallucinations, memory loss and suicidal ideas. Depression: improve. Substance abuse: Hx of substance abuse  Nervous/anxious: improved. Insomnia: improved.     Blood pressure 140/90, pulse 86, temperature 97.8 F (36.6 C), temperature source Oral, resp. rate 16, height 5\' 9"  (1.753 m), weight 205 lb (93 kg), SpO2 98 %.  Body mass index is 30.27 kg/m.   Have you used any form of tobacco in the last 30 days? (Cigarettes, Smokeless Tobacco, Cigars, and/or Pipes): No  Has this patient used any form of tobacco in the last 30 days? (Cigarettes, Smokeless Tobacco, Cigars, and/or Pipes)  N/A  Blood Alcohol level:  Lab Results  Component Value Date   ETH 185 (H) 12/30/2016   ETH 239 (H) 12/20/2016    Metabolic  Disorder Labs:  Lab Results  Component Value Date   HGBA1C 4.9 01/03/2017   MPG 94 01/03/2017   MPG 91 12/17/2015   No results found for: PROLACTIN Lab Results  Component Value Date   CHOL 289 (H) 01/03/2017   TRIG 208 (H) 01/03/2017   HDL 54 01/03/2017   CHOLHDL 5.4 01/03/2017   VLDL 42 (H) 01/03/2017   LDLCALC 193 (H) 01/03/2017   LDLCALC 148 (H) 12/17/2015    See Psychiatric Specialty Exam and Suicide Risk Assessment completed by Attending Physician prior to discharge.  Discharge destination:  Home  Is patient on multiple antipsychotic therapies at discharge:  No   Has Patient had three or more failed trials of antipsychotic monotherapy by history:  No  Recommended Plan for Multiple Antipsychotic Therapies: NA   Allergies as of 01/07/2017   No Known Allergies     Medication List    STOP taking these medications   phenol 1.4 % Liqd Commonly known as:  CHLORASEPTIC     TAKE these medications     Indication  atorvastatin 20 MG tablet Commonly known as:  LIPITOR Take 1 tablet (20 mg total) by mouth daily at 6 PM.  Indication:  High Amount of Fats in the Blood   divalproex 500 MG 24 hr tablet Commonly known as:  DEPAKOTE ER Take 2 tablets (1,000 mg total) by mouth at bedtime.  Indication:  Mixed Bipolar Affective Disorder   famotidine 20 MG tablet Commonly known as:  PEPCID Take 1 tablet (20 mg total) by mouth daily.  Indication:  Gastroesophageal Reflux Disease   folic acid 1 MG tablet Commonly known as:  FOLVITE Take 1 tablet (1 mg total) by mouth daily.  Indication:  folate deficiency   gabapentin 300 MG capsule Commonly known as:  NEURONTIN Take 1 capsule (300 mg total) by mouth 3 (three) times daily.  Indication:  Alcohol Withdrawal Syndrome, anxiety   multivitamin with minerals Tabs tablet Take 1 tablet by mouth daily.  Indication:  vitamin deficiency   naltrexone 50 MG tablet Commonly known as:  DEPADE Take 1 tablet (50 mg total) by mouth  daily.  Indication:  Excessive Use of Alcohol   omega-3 acid ethyl esters 1 g capsule Commonly known as:  LOVAZA Take 1 capsule (1 g total) by mouth 2 (two) times daily.  Indication:  High Amount of Triglycerides in the Blood   thiamine 100 MG tablet Commonly known as:  VITAMIN B-1 Take 1 tablet (100 mg total) by mouth daily.  Indication:  Deficiency in Thiamine or Vitamin B1   traZODone 100 MG tablet Commonly known as:  DESYREL Take 1 tablet (100 mg total) by mouth at bedtime.  Indication:  Aggressive Behavior, Alcohol Withdrawal Syndrome, Excessive Use of Alcohol, Trouble Sleeping      Follow-up Information    [facility] Follow up on 01/09/2017.   Specialty:  Behavioral Health Why:  Hospital follow-up appt on Wednesday at 8:00AM with Tim. Thank you.  Contact information: [address] [place] [address] [phone]  Follow-up recommendations:  Follow up with your outpatient provided for any medical issues. Activity & diet as recommended by your primary care provider.  Comments:  Patient is instructed prior to discharge to: Take all medications as prescribed by his/her mental healthcare provider. Report any adverse effects and or reactions from the medicines to his/her outpatient provider promptly. Patient has been instructed & cautioned: To not engage in alcohol and or illegal drug use while on prescription medicines. In the event of worsening symptoms, patient is instructed to call the crisis hotline, 911 and or go to the nearest ED for appropriate evaluation and treatment of symptoms. To follow-up with his/her primary care provider for your other medical issues, concerns and or health care needs.  Signed: Denzil MagnusonLaShunda Shraga Custard, NP 01/07/2017, 11:40 AM

## 2017-01-07 NOTE — BHH Suicide Risk Assessment (Signed)
[facility]   Principal Problem: Bipolar 1 disorder [facility](HCC) Discharge Diagnoses:  Patient Active Problem List   Diagnosis Date Noted  . Suicide attempt (HCC) [T14.91XA] 01/02/2017  . Amitriptyline overdose [T43.011A] 12/31/2016  . Substance induced mood disorder (HCC) [F19.94] 12/21/2016  . Polysubstance dependence (HCC) [F19.20] 12/21/2016  . Bipolar affective disorder, depressed, severe (HCC) [F31.4] 08/07/2016  . Bipolar 1 disorder (HCC) [F31.9] 12/16/2015  . Alcohol dependence with alcohol-induced mood disorder (HCC) [F10.24]   . Alcohol use disorder, severe, dependence (HCC) [F10.20] 12/15/2015  . Bipolar I disorder, most recent episode depressed (HCC) [F31.30] 05/15/2015  . Dog bite of left calf [S81.852A, W54.0XXA] 06/19/2014  . Alcohol abuse [F10.10] 06/18/2014  . Alcohol withdrawal (HCC) [F10.239] 06/18/2014  . Alcohol intoxication, episodic (HCC) [F10.129] 06/18/2014  . Toxic encephalopathy [G92] 06/18/2014  . Leukocytosis [D72.829]   . Polysubstance abuse [F19.10]   . CLOSED FRACTURE OF NECK OF METACARPAL BONE [S62.339A] 06/16/2010    Total Time spent with patient: 30 minutes  Musculoskeletal: Strength & Muscle Tone: within normal limits Gait & Station: normal Patient leans: N/A  Psychiatric Specialty Exam: ROS no headache, chronic L shoulder pain, now improving, no chest pain, no shortness of breath, no vomiting, no rash, no fever, no chills  Blood pressure 140/90, pulse 86, temperature 97.8 F (36.6 C), temperature source Oral, resp. rate 16, height 5\' 9"  (1.753 m), weight 93 kg (205 lb), SpO2 98 %.Body mass index is 30.27 kg/m.  General Appearance: Well Groomed  Eye Contact::  Good  Speech:  Normal Rate409  Volume:  Normal  Mood:  improved mood, at this time not feeling depressed   Affect:  Appropriate and reactive  Thought Process:  Linear and Descriptions of Associations: Intact  Orientation:  Other:  fully alert and attentive   Thought Content:  no hallucinations, no delusions, not internally preoccupied   Suicidal Thoughts:  No denies any suicidal or self injurious ideations, no homicidal or violent ideations  Homicidal Thoughts:  No  Memory:  recent and remote grossly intact   Judgement:  Other:  improving   Insight:  improving   Psychomotor Activity:  Normal  Concentration:  Good  Recall:  Good  Fund of Knowledge:Good  Language: Good  Akathisia:  Negative  Handed:  Right  AIMS (if indicated):     Assets:  Communication Skills Desire for Improvement Social Support  Sleep:  Number of Hours: 6.5  Cognition: WNL  ADL's:  Intact   Mental Status Per Nursing Assessment::   On Admission:  NA  Demographic Factors:  [age] year old single male, no children, had been living with his parents, but states that his father drinks so has decided to move out and move in with his sister , whom he states does not drink and provides an alcohol free environment . Works Holiday representativeconstruction, but currently unemployed   Loss Factors: Family stressors   Historical Factors: Has been diagnosed with Bipolar Disorder, history of Alcohol Dependence, history of suicidal attempt  Risk Reduction Factors:   Employed, Living with another person, especially a relative and Positive coping skills or problem solving skills  Continued Clinical Symptoms:  At this time patient is alert, attentive, well related, calm, mood is improved and denies feeling depressed at this time, affect is appropriate, reactive, no thought disorder, no suicidal or self injurious ideations, no homicidal or violent ideations, no psychotic symptoms, future oriented . Denies cravings for alcohol Behavior on unit is in good control. Denies medication side  effects.  Cognitive Features That Contribute To Risk:  No gross cognitive deficits noted upon discharge. Is alert , attentive, and oriented x 3   Suicide Risk:  Mild:  Suicidal ideation of limited frequency,  intensity, duration, and specificity.  There are no identifiable plans, no associated intent, mild dysphoria and related symptoms, good self-control (both objective and subjective assessment), few other risk factors, and identifiable protective factors, including available and accessible social support.  Follow-up Information    [facility] Follow up on 01/09/2017.   Specialty:  Behavioral Health Why:  Hospital follow-up appt on Wednesday at 8:00AM with Tim. Thank you.  Contact information: [address] [place] [address] [phone]           Plan Of Care/Follow-up recommendations:  Activity:  as tolerated  Diet:  Regular Tests:  NA Other:  See below  Patient is leaving in good spirits  Plans to go live with his sister  Encouraged to go to 12 step meetings and encouraged him to avoid people, places and situations he associates with alcohol consumption in order to decrease risk of relapse.  Craige CottaFernando A Elli Groesbeck, MD 01/07/2017, 10:24 AM

## 2017-01-07 NOTE — Progress Notes (Signed)
Pt discharged to lobby. Pt was stable and appreciative at that time. All papers, samples and prescriptions were given and valuables returned. Verbal understanding expressed. Denies SI/HI and A/VH. Pt given opportunity to express concerns and ask questions.  

## 2017-01-07 NOTE — Progress Notes (Signed)
Recreation Therapy Notes  Date: 01/07/2017 Time: 9:30am Location: [address] Dayroom  Group Topic: Stress Management  Goal Area(s) Addresses:  Patient will verbalize importance of using healthy stress management.  Patient will identify positive emotions associated with healthy stress management.   Intervention: Stress Management  Activity :  Guided Body Scan. Recreation Therapy Intern introduced the stress management technique of guided body scanning. Recreation Therapy Intern played a YouTube video that allowed patients to mentally scan their body for areas of tensions. Patients were to follow along as video was played to engage in the activity.  Education: Stress Management, Discharge Planning.   Education Outcome: Acknowledges edcuation  Clinical Observations/Feedback: Pt did not attend group.  Corlis Angelica, Recreation Therapy Intern 

## 2017-01-07 NOTE — Progress Notes (Signed)
Pt reports he has had a good day.  He denies SI/HI/AVH at this time.  He voices no needs or concerns.  He says that he hopes to be discharged tomorrow to go live with his sister.  He says it will be a good environment for him.  He plans to do outpatient appointments.  He has been pleasant and cooperative with staff.  He attended evening AA group tonight.  Support and encouragement offered.  Discharge plans are in process.  Safety maintained with q15 minute checks.

## 2017-01-07 NOTE — Progress Notes (Signed)
  [facility] :  Will you be returning to the same living situation after discharge:  No, pt plans to move in with his sister at discharge.  At discharge, do you have transportation home?: Yes,  sister Do you have the ability to pay for your medications: Yes,  mental health  Release of information consent forms completed and submitted to medical records by CSW.  Patient to Follow up at: Follow-up Information    [facility] Follow up on 01/09/2017.   Specialty:  Behavioral Health Why:  Hospital follow-up appt on Wednesday at 8:00AM with Tim. Thank you.  Contact information: [address] [place] [address] [phone]           Next level of care provider has access to [facility] Link:no  Safety Planning and Suicide Prevention discussed: Yes,  SPE completed with pt's sister/SPI pamphlet and Mobile Crisis information provided to pt.   Have you used any form of tobacco in the last 30 days? (Cigarettes, Smokeless Tobacco, Cigars, and/or Pipes): No  Has patient been referred to the Quitline?: N/A patient is not a smoker  Patient has been referred for addiction treatment: Yes  Pulte HomesHeather N Smart LCSW 01/07/2017, 9:51 AM

## 2017-01-07 NOTE — BHH Suicide Risk Assessment (Signed)
B[facility] INPATIENT:  Family/Significant Other Suicide Prevention Education  Suicide Prevention Education:  Education Completed; Adam AmisSherita Novak (pt's sister) [phone] has been identified by the patient as the family member/significant other with whom the patient will be residing, and identified as the person(s) who will aid the patient in the event of a mental health crisis (suicidal ideations/suicide attempt).  With written consent from the patient, the family member/significant other has been provided the following suicide prevention education, prior to the and/or following the discharge of the patient.  The suicide prevention education provided includes the following:  Suicide risk factors  Suicide prevention and interventions  National Suicide Hotline telephone number  [facility] assessment telephone number  [facility] Emergency Assistance 911  [facility] and/or Residential Mobile Crisis Unit telephone number  Request made of family/significant other to:  Remove weapons (e.g., guns, rifles, knives), all items previously/currently identified as safety concern.    Remove drugs/medications (over-the-counter, prescriptions, illicit drugs), all items previously/currently identified as a safety concern.  The family member/significant other verbalizes understanding of the suicide prevention education information provided.  The family member/significant other agrees to remove the items of safety concern listed above.  [name] sister confirmed that pt will be living with her at discharge and voiced no concerns regarding pt discharge plan/safety issues. SPE reviewed and after care plan reviewed. She can pick him up after lunch this afternoon. No guns/weapons in the home.   Chani Ghanem N Smart LCSW 01/07/2017, 9:50 AM

## 2018-07-24 ENCOUNTER — Encounter (HOSPITAL_COMMUNITY): Payer: Self-pay | Admitting: *Deleted

## 2018-07-24 ENCOUNTER — Emergency Department (HOSPITAL_COMMUNITY): Payer: Self-pay

## 2018-07-24 ENCOUNTER — Emergency Department (HOSPITAL_COMMUNITY)
Admission: EM | Admit: 2018-07-24 | Discharge: 2018-07-24 | Disposition: A | Discharge status: Home / Self Care | Payer: Self-pay | Attending: Emergency Medicine | Admitting: Emergency Medicine

## 2018-07-24 DIAGNOSIS — S6291XA Unspecified fracture of right wrist and hand, initial encounter for closed fracture: Secondary | ICD-10-CM | POA: Insufficient documentation

## 2018-07-24 DIAGNOSIS — S0181XA Laceration without foreign body of other part of head, initial encounter: Secondary | ICD-10-CM

## 2018-07-24 DIAGNOSIS — Z23 Encounter for immunization: Secondary | ICD-10-CM | POA: Insufficient documentation

## 2018-07-24 DIAGNOSIS — Y929 Unspecified place or not applicable: Secondary | ICD-10-CM | POA: Insufficient documentation

## 2018-07-24 DIAGNOSIS — Y9389 Activity, other specified: Secondary | ICD-10-CM | POA: Insufficient documentation

## 2018-07-24 DIAGNOSIS — Y999 Unspecified external cause status: Secondary | ICD-10-CM | POA: Insufficient documentation

## 2018-07-24 DIAGNOSIS — T1490XA Injury, unspecified, initial encounter: Secondary | ICD-10-CM

## 2018-07-24 DIAGNOSIS — Z79899 Other long term (current) drug therapy: Secondary | ICD-10-CM | POA: Insufficient documentation

## 2018-07-24 DIAGNOSIS — F17210 Nicotine dependence, cigarettes, uncomplicated: Secondary | ICD-10-CM | POA: Insufficient documentation

## 2018-07-24 MED ORDER — LIDOCAINE HCL 2 % IJ SOLN
5.0000 mL | Freq: Once | INTRAMUSCULAR | Status: AC
  Administered 2018-07-24: 100 mg
  Filled 2018-07-24: qty 20

## 2018-07-24 MED ORDER — TETANUS-DIPHTH-ACELL PERTUSSIS 5-2.5-18.5 LF-MCG/0.5 IM SUSP
0.5000 mL | Freq: Once | INTRAMUSCULAR | Status: AC
  Administered 2018-07-24: 0.5 mL via INTRAMUSCULAR
  Filled 2018-07-24: qty 0.5

## 2018-07-24 NOTE — Progress Notes (Signed)
Orthopedic Tech Progress Note Patient Details:  Adam Novak [birth year] [id number]  Ortho Devices Type of Ortho Device: Ulna gutter splint Ortho Device/Splint Interventions: Ordered, Application, Adjustment   Post Interventions Instructions Provided: Adjustment of device   [name] J Irving Burton 07/24/2018, 12:18 PM
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
## 2018-07-24 NOTE — ED Notes (Signed)
Ortho tech at bedside 

## 2018-07-29 ENCOUNTER — Ambulatory Visit (HOSPITAL_COMMUNITY)
Admission: EM | Admit: 2018-07-29 | Discharge: 2018-07-29 | Disposition: A | Discharge status: Home / Self Care | Payer: Self-pay | Attending: Family Medicine | Admitting: Family Medicine

## 2018-07-29 ENCOUNTER — Other Ambulatory Visit: Payer: Self-pay

## 2018-07-29 ENCOUNTER — Encounter (HOSPITAL_COMMUNITY): Payer: Self-pay | Admitting: Emergency Medicine

## 2018-07-29 DIAGNOSIS — Z4802 Encounter for removal of sutures: Secondary | ICD-10-CM

## 2018-07-29 DIAGNOSIS — S0181XD Laceration without foreign body of other part of head, subsequent encounter: Secondary | ICD-10-CM

## 2018-07-29 DIAGNOSIS — S6291XA Unspecified fracture of right wrist and hand, initial encounter for closed fracture: Secondary | ICD-10-CM

## 2018-07-29 DIAGNOSIS — S6291XD Unspecified fracture of right wrist and hand, subsequent encounter for fracture with routine healing: Secondary | ICD-10-CM

## 2018-07-29 DIAGNOSIS — S01419A Laceration without foreign body of unspecified cheek and temporomandibular area, initial encounter: Secondary | ICD-10-CM

## 2018-07-29 NOTE — ED Triage Notes (Signed)
Patient had a cast placed at ED 07/24/2018.  Reports cast has been wet and cast is unraveling.

## 2018-07-29 NOTE — ED Provider Notes (Signed)
[facility]    CSN: [id number] Arrival date & time: 07/29/18  1230     History   Chief Complaint Chief Complaint  Patient presents with  . Cast Problem  . Appointment    12:30    HPI Adam Novak is a 28 y.o. male.   HPI Patient was seen in the emergency room on 07/24/2018.  He has a laceration to his right jawline.  He had a fracture to his right hand.  He left after his treatment without being formally discharged and did not get any discharge information about suture removal or referral. He is here because the cast is "unraveling".  He feels like it is breaking down.  He states is gotten wet.  He states it smells bad.  He wants a new cast. Past Medical History:  Diagnosis Date  . Bipolar 1 disorder [facility](HCC)     Patient Active Problem List   Diagnosis Date Noted  . Suicide attempt (HCC) 01/02/2017  . Amitriptyline overdose 12/31/2016  . Substance induced mood disorder (HCC) 12/21/2016  . Polysubstance dependence (HCC) 12/21/2016  . Bipolar affective disorder, depressed, severe (HCC) 08/07/2016  . Bipolar 1 disorder (HCC) 12/16/2015  . Alcohol dependence with alcohol-induced mood disorder (HCC)   . Alcohol use disorder, severe, dependence (HCC) 12/15/2015  . Bipolar I disorder, most recent episode depressed (HCC) 05/15/2015  . Dog bite of left calf 06/19/2014  . Alcohol abuse 06/18/2014  . Alcohol withdrawal (HCC) 06/18/2014  . Alcohol intoxication, episodic (HCC) 06/18/2014  . Toxic encephalopathy 06/18/2014  . Leukocytosis   . Polysubstance abuse (HCC)   . CLOSED FRACTURE OF NECK OF METACARPAL BONE 06/16/2010    Past Surgical History:  Procedure Laterality Date  . KNEE SURGERY Right        Home Medications    Prior to Admission medications   Medication Sig Start Date End Date Taking? Authorizing Provider  divalproex (DEPAKOTE ER) 500 MG 24 hr tablet Take 2 tablets (1,000 mg total) by mouth at bedtime. 01/07/17  Yes Denzil Magnusonhomas, Lashunda, NP    atorvastatin (LIPITOR) 20 MG tablet Take 1 tablet (20 mg total) by mouth daily at 6 PM. 01/07/17   Denzil Magnusonhomas, Lashunda, NP  famotidine (PEPCID) 20 MG tablet Take 1 tablet (20 mg total) by mouth daily. 01/03/17   Lenox PondsSilva Zapata, Edwin, MD  gabapentin (NEURONTIN) 300 MG capsule Take 1 capsule (300 mg total) by mouth 3 (three) times daily. 01/07/17   Denzil Magnusonhomas, Lashunda, NP  Multiple Vitamin (MULTIVITAMIN WITH MINERALS) TABS tablet Take 1 tablet by mouth daily.    [provider]  naltrexone (DEPADE) 50 MG tablet Take 1 tablet (50 mg total) by mouth daily. 12/22/16   Charm RingsLord, Jamison Y, NP  omega-3 acid ethyl esters (LOVAZA) 1 g capsule Take 1 capsule (1 g total) by mouth 2 (two) times daily. 01/07/17   Denzil Magnusonhomas, Lashunda, NP  thiamine (VITAMIN B-1) 100 MG tablet Take 1 tablet (100 mg total) by mouth daily. 01/02/17   Lenox PondsSilva Zapata, Edwin, MD  traZODone (DESYREL) 100 MG tablet Take 1 tablet (100 mg total) by mouth at bedtime. 01/07/17   Denzil Magnusonhomas, Lashunda, NP    Family History History reviewed. No pertinent family history.  Social History Social History   Tobacco Use  . Smoking status: Current Some Day Smoker    Packs/day: 0.50    Types: Cigarettes  . Smokeless tobacco: Never Used  Substance Use Topics  . Alcohol use: Yes    Comment: Pint or more liquor  daily  . Drug use: Yes    Types: Marijuana, Benzodiazepines, MDMA (Ecstacy)     Allergies   Patient has no known allergies.   Review of Systems Review of Systems  Constitutional: Negative for chills and fever.  HENT: Negative for ear pain and sore throat.   Eyes: Negative for pain and visual disturbance.  Respiratory: Negative for cough and shortness of breath.   Cardiovascular: Negative for chest pain and palpitations.  Gastrointestinal: Negative for abdominal pain and vomiting.  Genitourinary: Negative for dysuria and hematuria.  Musculoskeletal: Positive for arthralgias. Negative for back pain.  Skin: Positive for wound. Negative for  color change and rash.  Neurological: Negative for seizures and syncope.  All other systems reviewed and are negative.    Physical Exam Triage Vital Signs ED Triage Vitals  Enc Vitals Group     BP 07/29/18 1242 (!) 158/94     Pulse Rate 07/29/18 1242 96     Resp 07/29/18 1242 18     Temp 07/29/18 1242 98 F (36.7 C)     Temp Source 07/29/18 1242 Oral     SpO2 07/29/18 1242 97 %     Weight --      Height --      Head Circumference --      Peak Flow --      Pain Score 07/29/18 1239 5     Pain Loc --      Pain Edu? --      Excl. in GC? --    No data found.  Updated Vital Signs BP (!) 158/94 (BP Location: Left Arm)   Pulse 96   Temp 98 F (36.7 C) (Oral)   Resp 18   SpO2 97%   Visual Acuity Right Eye Distance:   Left Eye Distance:   Bilateral Distance:    Right Eye Near:   Left Eye Near:    Bilateral Near:     Physical Exam Constitutional:      General: He is not in acute distress.    Appearance: He is well-developed.  HENT:     Head: Normocephalic and atraumatic.  Eyes:     Conjunctiva/sclera: Conjunctivae normal.     Pupils: Pupils are equal, round, and reactive to light.  Neck:     Musculoskeletal: Normal range of motion.  Cardiovascular:     Rate and Rhythm: Normal rate.  Pulmonary:     Effort: Pulmonary effort is normal. No respiratory distress.  Abdominal:     General: There is no distension.     Palpations: Abdomen is soft.  Musculoskeletal: Normal range of motion.     Comments: Right hand in ulnar gutter splint.  On removal of the dorsum of the hand especially laterally is quite swollen with resolving ecchymosis.  No open wound.  There is pain directly over the fourth and fifth metacarpals proximally.  Limited movement of fingers without pain.  No rotation.  Neurovascular is intact.  Mild swelling of hand  Skin:    General: Skin is warm and dry.     Comments: There is a 1/2 cm laceration at the right jawline.  Crusted.  After soaking, the sutures  removed without difficulty.  Bacitracin placed.  Wound care discussed  Neurological:     Mental Status: He is alert.     Sensory: Sensory deficit () present.  Psychiatric:        Mood and Affect: Mood normal.        Behavior: Behavior normal.  The splint is removed.  Hand is washed thoroughly.  Splint is trimmed and disinfected with spray.  It is then replaced a new wrap.   UC Treatments / Results  Labs (all labs ordered are listed, but only abnormal results are displayed) Labs Reviewed - No data to display  EKG None  Radiology No results found.  Procedures Procedures (including critical care time)  Medications Ordered in UC Medications - No data to display  Initial Impression / Assessment and Plan / UC Course  I have reviewed the triage vital signs and the nursing notes.  Pertinent labs & imaging results that were available during my care of the patient were reviewed by me and considered in my medical decision making (see chart for details).     *Patient is told that he needs to call the surgeon today for follow-up.  proper cast care is discussed. Final Clinical Impressions(s) / UC Diagnoses   Final diagnoses:  Hand fracture, right, with routine healing, subsequent encounter  Laceration of skin of face, subsequent encounter     Discharge Instructions     Call to set up a follow up with Cr Coley Keep splint clean and dry Elevate to reduce swelling   ED Prescriptions    None     Controlled Substance Prescriptions [place] Controlled Substance Registry consulted? Not Applicable   Eustace Moore, MD 07/29/18 1415

## 2018-07-29 NOTE — Discharge Instructions (Signed)
Call to set up a follow up with Cr Coley Keep splint clean and dry Elevate to reduce swelling

## 2019-05-30 ENCOUNTER — Other Ambulatory Visit: Payer: Self-pay

## 2019-05-30 ENCOUNTER — Emergency Department (HOSPITAL_COMMUNITY)
Admission: EM | Admit: 2019-05-30 | Discharge: 2019-05-30 | Payer: Self-pay | Attending: Emergency Medicine | Admitting: Emergency Medicine

## 2019-05-30 ENCOUNTER — Encounter (HOSPITAL_COMMUNITY): Payer: Self-pay

## 2019-05-30 DIAGNOSIS — S022XXA Fracture of nasal bones, initial encounter for closed fracture: Secondary | ICD-10-CM | POA: Insufficient documentation

## 2019-05-30 DIAGNOSIS — F10929 Alcohol use, unspecified with intoxication, unspecified: Secondary | ICD-10-CM | POA: Insufficient documentation

## 2019-05-30 DIAGNOSIS — Y92019 Unspecified place in single-family (private) house as the place of occurrence of the external cause: Secondary | ICD-10-CM | POA: Insufficient documentation

## 2019-05-30 DIAGNOSIS — F17210 Nicotine dependence, cigarettes, uncomplicated: Secondary | ICD-10-CM | POA: Insufficient documentation

## 2019-05-30 DIAGNOSIS — Y999 Unspecified external cause status: Secondary | ICD-10-CM | POA: Insufficient documentation

## 2019-05-30 DIAGNOSIS — F10920 Alcohol use, unspecified with intoxication, uncomplicated: Secondary | ICD-10-CM

## 2019-05-30 DIAGNOSIS — Y9389 Activity, other specified: Secondary | ICD-10-CM | POA: Insufficient documentation
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
## 2019-05-30 NOTE — Discharge Instructions (Signed)
Take tylenol and ibuprofen for pain.  Use saline spray in the nose to clear it out.
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
## 2019-05-30 NOTE — ED Notes (Signed)
Patient stating he is leaving, patient is slurring words, cursing at [facility]. Patient asked repeatedly to please go back in room.

## 2019-05-30 NOTE — ED Notes (Signed)
ED Provider at bedside. 

## 2019-10-25 ENCOUNTER — Emergency Department (HOSPITAL_COMMUNITY)
Admission: EM | Admit: 2019-10-25 | Discharge: 2019-10-25 | Payer: Self-pay | Attending: Emergency Medicine | Admitting: Emergency Medicine

## 2019-10-25 ENCOUNTER — Other Ambulatory Visit: Payer: Self-pay

## 2019-10-25 ENCOUNTER — Encounter (HOSPITAL_COMMUNITY): Payer: Self-pay | Admitting: Emergency Medicine

## 2019-10-25 ENCOUNTER — Emergency Department (HOSPITAL_COMMUNITY): Payer: Self-pay

## 2019-10-25 DIAGNOSIS — Y929 Unspecified place or not applicable: Secondary | ICD-10-CM | POA: Insufficient documentation

## 2019-10-25 DIAGNOSIS — F1210 Cannabis abuse, uncomplicated: Secondary | ICD-10-CM | POA: Insufficient documentation

## 2019-10-25 DIAGNOSIS — F1310 Sedative, hypnotic or anxiolytic abuse, uncomplicated: Secondary | ICD-10-CM | POA: Insufficient documentation

## 2019-10-25 DIAGNOSIS — Y9389 Activity, other specified: Secondary | ICD-10-CM | POA: Insufficient documentation

## 2019-10-25 DIAGNOSIS — S61216A Laceration without foreign body of right little finger without damage to nail, initial encounter: Secondary | ICD-10-CM | POA: Insufficient documentation

## 2019-10-25 DIAGNOSIS — Y998 Other external cause status: Secondary | ICD-10-CM | POA: Insufficient documentation

## 2019-10-25 DIAGNOSIS — W228XXA Striking against or struck by other objects, initial encounter: Secondary | ICD-10-CM | POA: Insufficient documentation

## 2019-10-25 DIAGNOSIS — F17210 Nicotine dependence, cigarettes, uncomplicated: Secondary | ICD-10-CM | POA: Insufficient documentation

## 2019-10-25 DIAGNOSIS — F1610 Hallucinogen abuse, uncomplicated: Secondary | ICD-10-CM | POA: Insufficient documentation
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
## 2019-10-25 NOTE — ED Notes (Signed)
Patient ambulatory to x-ray.

## 2019-10-25 NOTE — ED Notes (Addendum)
Patient refusing to be stitched by PA. Patient also refusing to sign for d/c
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
## 2019-10-25 NOTE — Discharge Instructions (Signed)
No fracture of x-ray.  Patient refused sutures and tetanus vaccine.

## 2019-11-17 ENCOUNTER — Other Ambulatory Visit: Payer: Self-pay

## 2019-11-17 ENCOUNTER — Encounter: Payer: Self-pay | Admitting: Family Medicine

## 2019-11-17 ENCOUNTER — Ambulatory Visit (INDEPENDENT_AMBULATORY_CARE_PROVIDER_SITE_OTHER): Payer: Self-pay | Admitting: Family Medicine

## 2019-11-17 DIAGNOSIS — F314 Bipolar disorder, current episode depressed, severe, without psychotic features: Secondary | ICD-10-CM

## 2019-11-17 DIAGNOSIS — R030 Elevated blood-pressure reading, without diagnosis of hypertension: Secondary | ICD-10-CM

## 2019-11-17 DIAGNOSIS — F1020 Alcohol dependence, uncomplicated: Secondary | ICD-10-CM

## 2019-11-17 DIAGNOSIS — Z720 Tobacco use: Secondary | ICD-10-CM

## 2019-11-17 MED ORDER — NALTREXONE HCL 50 MG PO TABS: 50.0000 mg | ORAL_TABLET | Freq: Every day | ORAL | 0 refills | Status: DC

## 2019-11-17 MED ORDER — DIVALPROEX SODIUM ER 500 MG PO TB24: 1000.0000 mg | ORAL_TABLET | Freq: Every day | ORAL | 0 refills | Status: DC

## 2019-11-17 MED ORDER — GABAPENTIN 300 MG PO CAPS: 300.0000 mg | ORAL_CAPSULE | Freq: Three times a day (TID) | ORAL | 0 refills | Status: DC

## 2019-11-17 NOTE — Patient Instructions (Signed)
Good to see you today!  Thanks for coming in.  I refilled three of your medications for a month  Make an appointment at [facility] as soon as possible  I will call you if your lab tests are not normal.  Otherwise we will discuss them at your next visit.  Keep staying away from alcohol   Come back in two weeks

## 2019-11-18 ENCOUNTER — Encounter: Payer: Self-pay | Admitting: Family Medicine

## 2019-11-18 DIAGNOSIS — Z720 Tobacco use: Secondary | ICD-10-CM | POA: Insufficient documentation

## 2019-11-18 LAB — CMP14+EGFR
ALT: 20 IU/L (ref 0–44)
AST: 23 IU/L (ref 0–40)
Albumin/Globulin Ratio: 2 (ref 1.2–2.2)
Albumin: 5 g/dL (ref 4.1–5.2)
Alkaline Phosphatase: 72 IU/L (ref 48–121)
BUN/Creatinine Ratio: 16 (ref 9–20)
BUN: 16 mg/dL (ref 6–20)
Bilirubin Total: 0.6 mg/dL (ref 0.0–1.2)
CO2: 22 mmol/L (ref 20–29)
Calcium: 9.8 mg/dL (ref 8.7–10.2)
Chloride: 100 mmol/L (ref 96–106)
Creatinine, Ser: 0.99 mg/dL (ref 0.76–1.27)
GFR calc Af Amer: 120 mL/min/{1.73_m2} (ref >59)
GFR calc non Af Amer: 104 mL/min/{1.73_m2} (ref >59)
Globulin, Total: 2.5 g/dL (ref 1.5–4.5)
Glucose: 89 mg/dL (ref 65–99)
Potassium: 4.3 mmol/L (ref 3.5–5.2)
Sodium: 138 mmol/L (ref 134–144)
Total Protein: 7.5 g/dL (ref 6.0–8.5)

## 2019-11-18 LAB — CBC
Hematocrit: 45.4 % (ref 37.5–51.0)
Hemoglobin: 15.9 g/dL (ref 13.0–17.7)
MCH: 34.7 pg — ABNORMAL HIGH (ref 26.6–33.0)
MCHC: 35 g/dL (ref 31.5–35.7)
MCV: 99 fL — ABNORMAL HIGH (ref 79–97)
Platelets: 266 10*3/uL (ref 150–450)
RBC: 4.58 x10E6/uL (ref 4.14–5.80)
RDW: 12 % (ref 11.6–15.4)
WBC: 7.9 10*3/uL (ref 3.4–10.8)

## 2019-11-18 NOTE — Progress Notes (Signed)
    SUBJECTIVE:   CHIEF COMPLAINT / HPI:   ALCOHOLISM Drink frequently and has been on naltrexone and gabapentin to try to achieve abstatinance.  Has been out of these for a whiled due to Covid. Has not drank for 2 days now.  Does not feel shaky.  Unsure of history of DTs  BIPOLAR Has been on depakote before but has run out.  Plans to go back to [facility] to get.  Has been arrested several times for violence or suicidal ideation.  Does not feel suicidal now and if he did he would go to [facility] mental health.   ELEVATED BP  Has noticed at times when measured his blood pressure has been high.  Never had formal diagnosis.  Feels that often he is having withdrawal symptoms that may raise it when it is measured.   Does have fhx of hypertension.   No lightheadness or chest pain with exertion  PERTINENT  PMH / PSH: Lives with his parents  OBJECTIVE:   BP (!) 140/110   Pulse 76   Wt 194 lb 12.8 oz (88.4 kg)   SpO2 96%   BMI 28.77 kg/m   Psych:  Cognition and judgment appear intact. Alert, communicative  and cooperative with normal attention span and concentration. No apparent delusions, illusions, hallucinations Heart - Regular rate and rhythm.  No murmurs, gallops or rubs.    Lungs:  Normal respiratory effort, chest expands symmetrically. Lungs are clear to auscultation, no crackles or wheezes. Extremities:  No cyanosis, edema, or deformity noted with good range of motion of all major joints.     ASSESSMENT/PLAN:   Elevated blood pressure reading Seems to be a relatively new phenomenon given past blood pressure readings.  Will check labs and follow up closely.  Needs UA next visit   Bipolar affective disorder, depressed, severe (HCC) Seems relatively stable today on first meeting.  Refilled his depakote and he plans to follow up with [facility]   Alcohol use disorder, severe, dependence (HCC) Has not drunk for 2 days and without overt signs of withdrawal except perhaps for elevated blood  pressure.  Will refill gabapentin and naloxone and follow up with [facility] asap      Carney Living, MD [facility]

## 2019-11-18 NOTE — Assessment & Plan Note (Signed)
Seems relatively stable today on first meeting.  Refilled his depakote and he plans to follow up with [facility]

## 2019-11-18 NOTE — Assessment & Plan Note (Signed)
Seems to be a relatively new phenomenon given past blood pressure readings.  Will check labs and follow up closely.  Needs UA next visit

## 2019-11-18 NOTE — Assessment & Plan Note (Signed)
Has not drunk for 2 days and without overt signs of withdrawal except perhaps for elevated blood pressure.  Will refill gabapentin and naloxone and follow up with [facility] asap

## 2019-12-01 ENCOUNTER — Ambulatory Visit: Payer: Self-pay | Admitting: Family Medicine

## 2020-12-23 ENCOUNTER — Other Ambulatory Visit: Payer: Self-pay

## 2020-12-23 ENCOUNTER — Ambulatory Visit (HOSPITAL_COMMUNITY)
Admission: EM | Admit: 2020-12-23 | Discharge: 2020-12-23 | Disposition: A | Discharge status: Home / Self Care | Payer: Self-pay | Attending: Physician Assistant | Admitting: Physician Assistant

## 2020-12-23 ENCOUNTER — Encounter (HOSPITAL_COMMUNITY): Payer: Self-pay

## 2020-12-23 DIAGNOSIS — L301 Dyshidrosis [pompholyx]: Secondary | ICD-10-CM

## 2020-12-23 MED ORDER — DESONIDE 0.05 % EX OINT: 1.0000 "application " | TOPICAL_OINTMENT | Freq: Two times a day (BID) | CUTANEOUS | 0 refills | Status: DC

## 2020-12-23 MED ORDER — HYDROXYZINE HCL 25 MG PO TABS: 25.0000 mg | ORAL_TABLET | Freq: Three times a day (TID) | ORAL | 0 refills | Status: DC | PRN
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
## 2020-12-23 NOTE — Discharge Instructions (Addendum)
Take Atarax (hydroxyzine) up to 3 times a day as needed for itching symptoms.  This can make you sleepy do not drive or drink alcohol while taking it.  Use desonide ointment to help with symptoms.  Keep area clean and avoid any known irritants including prolonged water exposure.  If your symptoms do not improve after several weeks we need to consider seeing a dermatologist.  Please follow-up with your primary care provider to ensure symptom improvement.  If anything worsens return here for reevaluation.

## 2020-12-23 NOTE — ED Triage Notes (Signed)
Patient presents to Urgent Care with complaints of rash noted on left hand since June. 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 He states he was applying a cream that did show some improvement mid June but recently worsened again spreading to right hand. He states he has noted a fever and increased burning sensation.

## 2020-12-28 ENCOUNTER — Ambulatory Visit: Payer: Self-pay

## 2020-12-29 ENCOUNTER — Encounter (HOSPITAL_COMMUNITY): Payer: Self-pay

## 2020-12-29 ENCOUNTER — Ambulatory Visit (HOSPITAL_COMMUNITY)
Admission: EM | Admit: 2020-12-29 | Discharge: 2020-12-29 | Disposition: A | Discharge status: Home / Self Care | Payer: Self-pay | Attending: Student | Admitting: Student

## 2020-12-29 ENCOUNTER — Other Ambulatory Visit: Payer: Self-pay

## 2020-12-29 ENCOUNTER — Ambulatory Visit (HOSPITAL_COMMUNITY): Payer: Self-pay

## 2020-12-29 DIAGNOSIS — L301 Dyshidrosis [pompholyx]: Secondary | ICD-10-CM

## 2020-12-29 MED ORDER — TRIAMCINOLONE ACETONIDE 0.5 % EX OINT: 1.0000 "application " | TOPICAL_OINTMENT | Freq: Two times a day (BID) | CUTANEOUS | 0 refills | Status: AC

## 2020-12-29 NOTE — ED Triage Notes (Signed)
Pt reports itchy rash in hands, arms, legs and back x 1 month. Reports Desonide ointment gives no relief.

## 2020-12-29 NOTE — Discharge Instructions (Addendum)
-  Triamcinolone ointment 2x daily x7 days. Avoid face and genitals.  -Follow-up with dermatology if symptoms persist, information below, call them  -Limit dishwashing -Try benedryl for symptomatic relief of itching -You can pick up an over-the-counter moisturizer or Aloe vera for additional relief.

## 2020-12-29 NOTE — ED Provider Notes (Signed)
[facility]    CSN: [id number] Arrival date & time: 12/29/20  1702      History   Chief Complaint Chief Complaint  Patient presents with   Appointment    1700   Rash    HPI Adam Novak is a 30 y.o. male presenting for rash on arms.  Medical history of substance abuse, bipolar disorder, alcohol use disorder.  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  No previous history of similar issues.  Was prescribed desonide ointment and hydroxyzine, he states this is providing minimal relief. Continues to endorse itchy burning rash on hands L>R and lower arms. Feeling well otherwise, denies fevers/chills.   HPI  Past Medical History:  Diagnosis Date   Bipolar 1 disorder [facility])     Patient Active Problem List   Diagnosis Date Noted   Tobacco abuse 11/18/2019   Elevated blood pressure reading 11/17/2019   Polysubstance dependence (HCC) 12/21/2016   Bipolar affective disorder, depressed, severe (HCC) 08/07/2016   Alcohol use disorder, severe, dependence (HCC) 12/15/2015   Bipolar I disorder, most recent episode depressed (HCC) 05/15/2015   Alcohol abuse 06/18/2014    Past Surgical History:  Procedure Laterality Date   KNEE SURGERY Right        Home Medications    Prior to Admission medications   Medication Sig Start Date End Date Taking? Authorizing Provider  triamcinolone ointment (KENALOG) 0.5 % Apply 1 application topically 2 (two) times daily for 7 days. 12/29/20 01/05/21 Yes Rhys Martini, PA-C  desonide (DESOWEN) 0.05 % ointment Apply 1 application topically 2 (two) times daily. 12/23/20   Raspet, Noberto Retort, PA-C  divalproex (DEPAKOTE ER) 500 MG 24 hr tablet Take 2 tablets (1,000 mg total) by mouth at bedtime. 11/17/19   Carney Living, MD  gabapentin (NEURONTIN) 300 MG capsule Take 1 capsule (300 mg total) by mouth 3 (three) times daily. 11/17/19   Carney Living,  MD  hydrOXYzine (ATARAX/VISTARIL) 25 MG tablet Take 1 tablet (25 mg total) by mouth every 8 (eight) hours as needed. 12/23/20   Raspet, Noberto Retort, PA-C  naltrexone (DEPADE) 50 MG tablet Take 1 tablet (50 mg total) by mouth daily. 11/17/19   Carney Living, MD    Family History History reviewed. No pertinent family history.  Social History Social History   Tobacco Use   Smoking status: Some Days    Packs/day: 0.50    Types: Cigarettes   Smokeless tobacco: Never  Vaping Use   Vaping Use: Never used  Substance Use Topics   Alcohol use: Yes    Comment: Pint or more liquor daily   Drug use: Yes    Types: Marijuana, Benzodiazepines, MDMA (Ecstacy)     Allergies   Patient has no allergy information on record.   Review of Systems Review of Systems  Skin:  Positive for rash.  All other systems reviewed and are negative.   Physical Exam Triage Vital Signs ED Triage Vitals  Enc Vitals Group     BP      Pulse      Resp      Temp      Temp src      SpO2      Weight      Height      Head Circumference      Peak Flow      Pain Score  Pain Loc      Pain Edu?      Excl. in GC?    No data found.  Updated Vital Signs BP 115/77 (BP Location: Right Arm)   Pulse (!) 120   Temp 98.5 F (36.9 C) (Oral)   Resp 18   SpO2 94%   Visual Acuity Right Eye Distance:   Left Eye Distance:   Bilateral Distance:    Right Eye Near:   Left Eye Near:    Bilateral Near:     Physical Exam Vitals reviewed.  Constitutional:      General: He is not in acute distress.    Appearance: Normal appearance. He is not ill-appearing or diaphoretic.     Comments: Patient slurring words and stumbling around room   HENT:     Head: Normocephalic and atraumatic.  Cardiovascular:     Rate and Rhythm: Regular rhythm. Tachycardia present.     Heart sounds: Normal heart sounds.  Pulmonary:     Effort: Pulmonary effort is normal.     Breath sounds: Normal breath sounds.  Skin:     General: Skin is warm.     Comments: Maculopapular rash to distal fingers L>R, with erythema in interphalangeal web spaces. Excoriation. No bleeding drainage or warmth. No streaking.  Neurological:     General: No focal deficit present.     Mental Status: He is alert and oriented to person, place, and time.     Comments: CN 2-12 grossly intact, PERRLA, EOMI  Psychiatric:        Mood and Affect: Mood normal.        Behavior: Behavior normal.        Thought Content: Thought content normal.        Judgment: Judgment normal.    UC Treatments / Results  Labs (all labs ordered are listed, but only abnormal results are displayed) Labs Reviewed - No data to display  EKG   Radiology No results found.  Procedures Procedures (including critical care time)  Medications Ordered in UC Medications - No data to display  Initial Impression / Assessment and Plan / UC Course  I have reviewed the triage vital signs and the nursing notes.  Pertinent labs & imaging results that were available during my care of the patient were reviewed by me and considered in my medical decision making (see chart for details).     This patient is a very pleasant 30 y.o. year old male presenting with dyshidrotic eczema. Afebrile.  This patient denies substance use other than CBD oil, but he is actively slurring his words and stumbling around room, rooting through drawers.  Attribute tachycardia to this.  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  Was prescribed desonide ointment and hydroxyzine.  Stop desonide and try triamcinolone.  Would not prescribe prednisone given tachycardia.  Follow-up with dermatology if symptoms persist, information provided  ED return precautions discussed. Patient verbalizes understanding and agreement.   Final Clinical Impressions(s) / UC Diagnoses   Final diagnoses:  Dyshidrotic eczema      Discharge Instructions      -Triamcinolone ointment 2x daily x7 days. Avoid face and genitals.  -Follow-up with dermatology if symptoms persist, information below, call them  -Limit dishwashing -Try benedryl for symptomatic relief of itching -You can pick up an over-the-counter moisturizer or Aloe vera for additional relief.     ED Prescriptions  Medication Sig Dispense Auth. Provider   triamcinolone ointment (KENALOG) 0.5 % Apply 1 application topically 2 (two) times daily for 7 days. 30 g Rhys Martini, PA-C      PDMP not reviewed this encounter.   Rhys Martini, PA-C 12/29/20 1801

## 2020-12-29 NOTE — ED Notes (Signed)
Heart rate reported to [place], [place].

## 2020-12-31 ENCOUNTER — Other Ambulatory Visit: Payer: Self-pay

## 2020-12-31 ENCOUNTER — Emergency Department (HOSPITAL_COMMUNITY): Payer: Self-pay

## 2020-12-31 ENCOUNTER — Emergency Department (HOSPITAL_COMMUNITY)
Admission: EM | Admit: 2020-12-31 | Discharge: 2021-01-01 | Disposition: A | Discharge status: Home / Self Care | Payer: Self-pay | Attending: Physician Assistant | Admitting: Physician Assistant

## 2020-12-31 DIAGNOSIS — Y9389 Activity, other specified: Secondary | ICD-10-CM | POA: Insufficient documentation

## 2020-12-31 DIAGNOSIS — R519 Headache, unspecified: Secondary | ICD-10-CM | POA: Insufficient documentation

## 2020-12-31 DIAGNOSIS — Z5321 Procedure and treatment not carried out due to patient leaving prior to being seen by health care provider: Secondary | ICD-10-CM | POA: Insufficient documentation

## 2020-12-31 DIAGNOSIS — R21 Rash and other nonspecific skin eruption: Secondary | ICD-10-CM | POA: Insufficient documentation

## 2020-12-31 DIAGNOSIS — S0081XA Abrasion of other part of head, initial encounter: Secondary | ICD-10-CM | POA: Insufficient documentation

## 2020-12-31 DIAGNOSIS — S40211A Abrasion of right shoulder, initial encounter: Secondary | ICD-10-CM | POA: Insufficient documentation

## 2020-12-31 MED ORDER — ACETAMINOPHEN 325 MG PO TABS
650.0000 mg | ORAL_TABLET | Freq: Once | ORAL | Status: AC
  Administered 2020-12-31: 650 mg via ORAL
  Filled 2020-12-31: qty 2
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
## 2021-01-01 NOTE — ED Notes (Signed)
Pt called 4x no answer  

## 2021-10-04 ENCOUNTER — Encounter (HOSPITAL_COMMUNITY): Payer: Self-pay | Admitting: Emergency Medicine

## 2021-10-04 ENCOUNTER — Ambulatory Visit (HOSPITAL_COMMUNITY): Payer: Self-pay

## 2021-10-04 ENCOUNTER — Ambulatory Visit (HOSPITAL_COMMUNITY)
Admission: EM | Admit: 2021-10-04 | Discharge: 2021-10-04 | Disposition: A | Discharge status: Home / Self Care | Payer: Self-pay | Attending: Internal Medicine | Admitting: Internal Medicine

## 2021-10-04 DIAGNOSIS — Z113 Encounter for screening for infections with a predominantly sexual mode of transmission: Secondary | ICD-10-CM | POA: Insufficient documentation

## 2021-10-04 DIAGNOSIS — Z7251 High risk heterosexual behavior: Secondary | ICD-10-CM | POA: Insufficient documentation

## 2021-10-04 DIAGNOSIS — B370 Candidal stomatitis: Secondary | ICD-10-CM | POA: Insufficient documentation

## 2021-10-04 LAB — HIV ANTIBODY (ROUTINE TESTING W REFLEX): HIV Screen 4th Generation wRfx: NONREACTIVE

## 2021-10-04 MED ORDER — NYSTATIN 100000 UNIT/ML MT SUSP: 500000.0000 [IU] | Freq: Four times a day (QID) | OROMUCOSAL | 0 refills | Status: DC
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
## 2021-10-04 NOTE — Discharge Instructions (Signed)
As we discussed, I am not exactly sure of what is causing your tongue to appear the way that it does.  It could be from a virus, however we could also consider that it is a yeast infection.  I have provided you with a medication that we will treat this.  Use it for 7 days.  We have tested you for STDs, you will be contacted with the results likely tomorrow.  If anything needs to be treated, we will let you know over the phone.  Please refrain from intercourse until you are sure that you do not have a sexually transmitted disease.  If your mouth is not improving over the next week, I recommend follow-up with a dentist or your primary care provider. ?

## 2021-10-05 LAB — CYTOLOGY, (ORAL, ANAL, URETHRAL) ANCILLARY ONLY
Chlamydia: NEGATIVE
Comment: NEGATIVE
Comment: NEGATIVE
Comment: NORMAL
Neisseria Gonorrhea: NEGATIVE
Trichomonas: NEGATIVE

## 2021-10-05 LAB — RPR: RPR Ser Ql: NONREACTIVE

## 2021-11-12 ENCOUNTER — Other Ambulatory Visit: Payer: Self-pay

## 2021-11-12 ENCOUNTER — Emergency Department (HOSPITAL_COMMUNITY)
Admission: EM | Admit: 2021-11-12 | Discharge: 2021-11-13 | Disposition: A | Discharge status: Home / Self Care | Payer: Self-pay | Attending: Emergency Medicine | Admitting: Emergency Medicine

## 2021-11-12 DIAGNOSIS — R519 Headache, unspecified: Secondary | ICD-10-CM | POA: Insufficient documentation

## 2021-11-12 DIAGNOSIS — S02601A Fracture of unspecified part of body of right mandible, initial encounter for closed fracture: Secondary | ICD-10-CM | POA: Insufficient documentation

## 2021-11-12 DIAGNOSIS — S02602A Fracture of unspecified part of body of left mandible, initial encounter for closed fracture: Secondary | ICD-10-CM | POA: Insufficient documentation

## 2021-11-12 DIAGNOSIS — S02609A Fracture of mandible, unspecified, initial encounter for closed fracture: Secondary | ICD-10-CM

## 2021-11-12 DIAGNOSIS — Z20822 Contact with and (suspected) exposure to covid-19: Secondary | ICD-10-CM | POA: Insufficient documentation

## 2021-11-12 DIAGNOSIS — M542 Cervicalgia: Secondary | ICD-10-CM | POA: Insufficient documentation

## 2021-11-12 MED ORDER — ONDANSETRON HCL 4 MG/2ML IJ SOLN
4.0000 mg | Freq: Once | INTRAMUSCULAR | Status: AC
  Administered 2021-11-12: 4 mg via INTRAVENOUS
  Filled 2021-11-12: qty 2

## 2021-11-12 MED ORDER — HYDROMORPHONE HCL 1 MG/ML IJ SOLN
1.0000 mg | Freq: Once | INTRAMUSCULAR | Status: AC
  Administered 2021-11-12: 1 mg via INTRAVENOUS
  Filled 2021-11-12: qty 1
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
## 2021-11-13 ENCOUNTER — Emergency Department (HOSPITAL_COMMUNITY): Payer: Self-pay

## 2021-11-13 LAB — COMPREHENSIVE METABOLIC PANEL
ALT: 22 U/L (ref 0–44)
AST: 28 U/L (ref 15–41)
Albumin: 4.4 g/dL (ref 3.5–5.0)
Alkaline Phosphatase: 60 U/L (ref 38–126)
Anion gap: 12 (ref 5–15)
BUN: 11 mg/dL (ref 6–20)
CO2: 23 mmol/L (ref 22–32)
Calcium: 9 mg/dL (ref 8.9–10.3)
Chloride: 102 mmol/L (ref 98–111)
Creatinine, Ser: 0.84 mg/dL (ref 0.61–1.24)
Glucose, Bld: 101 mg/dL — ABNORMAL HIGH (ref 70–99)
Potassium: 4 mmol/L (ref 3.5–5.1)
Sodium: 137 mmol/L (ref 135–145)
Total Bilirubin: 1.1 mg/dL (ref 0.3–1.2)
Total Protein: 7 g/dL (ref 6.5–8.1)

## 2021-11-13 LAB — LACTIC ACID, PLASMA: Lactic Acid, Venous: 1.7 mmol/L (ref 0.5–1.9)

## 2021-11-13 LAB — I-STAT CHEM 8, ED
BUN: 11 mg/dL (ref 6–20)
Calcium, Ion: 1.01 mmol/L — ABNORMAL LOW (ref 1.15–1.40)
Chloride: 101 mmol/L (ref 98–111)
Creatinine, Ser: 0.7 mg/dL (ref 0.61–1.24)
Glucose, Bld: 95 mg/dL (ref 70–99)
HCT: 51 % (ref 39.0–52.0)
Hemoglobin: 17.3 g/dL — ABNORMAL HIGH (ref 13.0–17.0)
Potassium: 3.8 mmol/L (ref 3.5–5.1)
Sodium: 138 mmol/L (ref 135–145)
TCO2: 26 mmol/L (ref 22–32)

## 2021-11-13 LAB — RESP PANEL BY RT-PCR (FLU A&B, COVID) ARPGX2
Influenza A by PCR: NEGATIVE
Influenza B by PCR: NEGATIVE
SARS Coronavirus 2 by RT PCR: NEGATIVE

## 2021-11-13 LAB — CBC
HCT: 47.6 % (ref 39.0–52.0)
Hemoglobin: 16.6 g/dL (ref 13.0–17.0)
MCH: 34.7 pg — ABNORMAL HIGH (ref 26.0–34.0)
MCHC: 34.9 g/dL (ref 30.0–36.0)
MCV: 99.6 fL (ref 80.0–100.0)
Platelets: 231 10*3/uL (ref 150–400)
RBC: 4.78 MIL/uL (ref 4.22–5.81)
RDW: 12.1 % (ref 11.5–15.5)
WBC: 11.4 10*3/uL — ABNORMAL HIGH (ref 4.0–10.5)
nRBC: 0 % (ref 0.0–0.2)

## 2021-11-13 LAB — PROTIME-INR
INR: 1 (ref 0.8–1.2)
Prothrombin Time: 13.5 seconds (ref 11.4–15.2)

## 2021-11-13 LAB — SAMPLE TO BLOOD BANK

## 2021-11-13 LAB — ETHANOL: Alcohol, Ethyl (B): <10 mg/dL (ref <10)

## 2021-11-13 MED ORDER — IOHEXOL 350 MG/ML SOLN
60.0000 mL | Freq: Once | INTRAVENOUS | Status: AC | PRN
Start: 2021-11-13 — End: 2021-11-13
  Administered 2021-11-13: 60 mL via INTRAVENOUS

## 2021-11-13 MED ORDER — OXYCODONE HCL 5 MG PO TABS: 2.5000 mg | ORAL_TABLET | ORAL | 0 refills | Status: DC | PRN

## 2021-11-13 MED ORDER — AMOXICILLIN-POT CLAVULANATE 875-125 MG PO TABS
1.0000 | ORAL_TABLET | Freq: Once | ORAL | Status: AC
  Administered 2021-11-13: 1 via ORAL
  Filled 2021-11-13: qty 1

## 2021-11-13 MED ORDER — AMOXICILLIN-POT CLAVULANATE 875-125 MG PO TABS: 1.0000 | ORAL_TABLET | Freq: Two times a day (BID) | ORAL | 0 refills | Status: DC

## 2021-11-13 MED ORDER — CHLORHEXIDINE GLUCONATE 0.12 % MT SOLN: 10.0000 mL | Freq: Three times a day (TID) | OROMUCOSAL | 2 refills | Status: DC

## 2021-11-13 MED ORDER — HYDROMORPHONE HCL 1 MG/ML IJ SOLN
1.0000 mg | Freq: Once | INTRAMUSCULAR | Status: AC
  Administered 2021-11-13: 1 mg via INTRAVENOUS
  Filled 2021-11-13: qty 1

## 2021-11-13 NOTE — Discharge Instructions (Addendum)
Take Tylenol 650mg  (arthritis strength) every 6 hours and Advil 600 mg (3 tablets) every 6 hours in alternating fashion ( eg- tylenol at 12, advil at 3, tylenol at 6 etc.) You may take the oxycodone for as needed for severe pain and as directed.  Stay on a FULL LIQUID DIET- NO CHEWING.  Follow up with Dr in the next 2 days.   Contact a health care provider if: You have a severe headache. You have more numbness of the face. You have jaw pain that is severe and does not lessen with medicine. You have nausea or anxiety that you cannot control. You cannot eat or drink without vomiting. Your swelling or redness gets worse. You have a fever. Get help right away if: You have difficulty breathing. You feel like your windpipe (trachea) is tightening. You cannot swallow your saliva. You make whistling sounds when you breathe (wheeze).

## 2021-11-14 ENCOUNTER — Ambulatory Visit: Payer: Self-pay | Admitting: Oral Surgery

## 2021-11-14 ENCOUNTER — Other Ambulatory Visit: Payer: Self-pay

## 2021-11-14 ENCOUNTER — Encounter (HOSPITAL_COMMUNITY): Payer: Self-pay | Admitting: Oral Surgery

## 2021-11-14 ENCOUNTER — Encounter: Payer: Self-pay | Admitting: *Deleted
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
## 2021-11-14 NOTE — Progress Notes (Addendum)
Adam Novak denies chest pain or shortness of breath or chest pain.  Patient denies having any s/s of Covid in her household.  Patient denies any known exposure to Covid.   Adam Novak has a history of anxiety and Bipolar 1.  Patient was taken medication to help him stop drinking alcohol. Adam Novak has been unable to afford medications, Depakote, gabapentin, hydroxyzine, naltrexone; patient states that he can not afford the medications. Adam Novak was a patient of Dr. Talbert Cage.  Adam Novak's  blood pressure has been high at last visit with Dr Erin Hearing, all ED visits.  The blood pressure taken in Dr Sander Radon office taken 11/13/21 was 150/89  Adam Novak is on full liquids until midnight, then I instructed patient t drink clear liquids until 1415.

## 2021-11-15 ENCOUNTER — Ambulatory Visit (HOSPITAL_COMMUNITY)
Admission: RE | Admit: 2021-11-15 | Discharge: 2021-11-15 | Disposition: A | Discharge status: Home / Self Care | Payer: Self-pay | Attending: Oral Surgery | Admitting: Oral Surgery

## 2021-11-15 ENCOUNTER — Other Ambulatory Visit: Payer: Self-pay

## 2021-11-15 ENCOUNTER — Encounter (HOSPITAL_COMMUNITY): Payer: Self-pay | Admitting: Oral Surgery

## 2021-11-15 ENCOUNTER — Ambulatory Visit (HOSPITAL_COMMUNITY): Payer: Self-pay | Admitting: Anesthesiology

## 2021-11-15 ENCOUNTER — Encounter (HOSPITAL_COMMUNITY)
Admission: RE | Disposition: A | Discharge status: Home / Self Care | Payer: Self-pay | Source: Home / Self Care | Attending: Oral Surgery

## 2021-11-15 ENCOUNTER — Ambulatory Visit (HOSPITAL_BASED_OUTPATIENT_CLINIC_OR_DEPARTMENT_OTHER): Payer: Self-pay | Admitting: Anesthesiology

## 2021-11-15 DIAGNOSIS — Z87891 Personal history of nicotine dependence: Secondary | ICD-10-CM | POA: Insufficient documentation

## 2021-11-15 DIAGNOSIS — S02609A Fracture of mandible, unspecified, initial encounter for closed fracture: Secondary | ICD-10-CM

## 2021-11-15 DIAGNOSIS — I10 Essential (primary) hypertension: Secondary | ICD-10-CM | POA: Insufficient documentation

## 2021-11-15 DIAGNOSIS — R000 Tachycardia, unspecified: Secondary | ICD-10-CM | POA: Insufficient documentation

## 2021-11-15 HISTORY — DX: Essential (primary) hypertension: I10

## 2021-11-15 HISTORY — PX: CLOSED REDUCTION MANDIBLE: SHX5307

## 2021-11-15 HISTORY — DX: Depression, unspecified: F32.A

## 2021-11-15 HISTORY — DX: Headache, unspecified: R51.9

## 2021-11-15 HISTORY — DX: Anxiety disorder, unspecified: F41.9

## 2021-11-15 HISTORY — DX: Cardiac murmur, unspecified: R01.1

## 2021-11-15 LAB — SURGICAL PCR SCREEN
MRSA, PCR: NEGATIVE
Staphylococcus aureus: NEGATIVE

## 2021-11-15 SURGERY — CLOSED REDUCTION, MANDIBLE
Anesthesia: General | Site: Mouth | Laterality: Bilateral

## 2021-11-15 MED ORDER — SUCCINYLCHOLINE CHLORIDE 200 MG/10ML IV SOSY
PREFILLED_SYRINGE | INTRAVENOUS | Status: DC | PRN
  Administered 2021-11-15: 80 mg via INTRAVENOUS

## 2021-11-15 MED ORDER — BUPIVACAINE-EPINEPHRINE 0.5% -1:200000 IJ SOLN
INTRAMUSCULAR | Status: AC
  Filled 2021-11-15: qty 1

## 2021-11-15 MED ORDER — HYDROMORPHONE HCL 1 MG/ML IJ SOLN
INTRAMUSCULAR | Status: AC
  Filled 2021-11-15: qty 0.5

## 2021-11-15 MED ORDER — MIDAZOLAM HCL 2 MG/2ML IJ SOLN
INTRAMUSCULAR | Status: AC
  Filled 2021-11-15: qty 2

## 2021-11-15 MED ORDER — FENTANYL CITRATE (PF) 250 MCG/5ML IJ SOLN
INTRAMUSCULAR | Status: DC | PRN
  Administered 2021-11-15: 50 ug via INTRAVENOUS
  Administered 2021-11-15: 100 ug via INTRAVENOUS
  Administered 2021-11-15 (×2): 50 ug via INTRAVENOUS

## 2021-11-15 MED ORDER — LIDOCAINE-EPINEPHRINE 2 %-1:100000 IJ SOLN
INTRAMUSCULAR | Status: AC
  Filled 2021-11-15: qty 1

## 2021-11-15 MED ORDER — DEXAMETHASONE SODIUM PHOSPHATE 10 MG/ML IJ SOLN
INTRAMUSCULAR | Status: AC
  Filled 2021-11-15: qty 1

## 2021-11-15 MED ORDER — HYDROMORPHONE HCL 1 MG/ML IJ SOLN
INTRAMUSCULAR | Status: DC | PRN
  Administered 2021-11-15: .5 mg via INTRAVENOUS

## 2021-11-15 MED ORDER — ACETAMINOPHEN 10 MG/ML IV SOLN
INTRAVENOUS | Status: AC
  Filled 2021-11-15: qty 100

## 2021-11-15 MED ORDER — LACTATED RINGERS IV SOLN: INTRAVENOUS | Status: DC

## 2021-11-15 MED ORDER — BUPIVACAINE HCL (PF) 0.25 % IJ SOLN
INTRAMUSCULAR | Status: AC
  Filled 2021-11-15: qty 10

## 2021-11-15 MED ORDER — 0.9 % SODIUM CHLORIDE (POUR BTL) OPTIME
TOPICAL | Status: DC | PRN
  Administered 2021-11-15: 1000 mL

## 2021-11-15 MED ORDER — ONDANSETRON HCL 4 MG/2ML IJ SOLN
INTRAMUSCULAR | Status: AC
  Filled 2021-11-15: qty 2

## 2021-11-15 MED ORDER — BUPIVACAINE-EPINEPHRINE 0.5% -1:200000 IJ SOLN
INTRAMUSCULAR | Status: DC | PRN
  Administered 2021-11-15: 10 mL

## 2021-11-15 MED ORDER — CHLORHEXIDINE GLUCONATE 0.12 % MT SOLN
OROMUCOSAL | Status: AC
  Administered 2021-11-15: 15 mL via OROMUCOSAL
  Filled 2021-11-15: qty 15

## 2021-11-15 MED ORDER — LIDOCAINE 2% (20 MG/ML) 5 ML SYRINGE
INTRAMUSCULAR | Status: DC | PRN
  Administered 2021-11-15: 80 mg via INTRAVENOUS

## 2021-11-15 MED ORDER — MIDAZOLAM HCL 2 MG/2ML IJ SOLN
INTRAMUSCULAR | Status: DC | PRN
  Administered 2021-11-15: 2 mg via INTRAVENOUS

## 2021-11-15 MED ORDER — ORAL CARE MOUTH RINSE: 15.0000 mL | Freq: Once | OROMUCOSAL | Status: AC

## 2021-11-15 MED ORDER — ROCURONIUM BROMIDE 10 MG/ML (PF) SYRINGE: PREFILLED_SYRINGE | INTRAVENOUS | Status: DC | PRN

## 2021-11-15 MED ORDER — PROPOFOL 10 MG/ML IV BOLUS
INTRAVENOUS | Status: AC
  Filled 2021-11-15: qty 20

## 2021-11-15 MED ORDER — SODIUM CHLORIDE 0.9 % IV SOLN
3.0000 g | INTRAVENOUS | Status: AC
  Administered 2021-11-15: 3 g via INTRAVENOUS
  Filled 2021-11-15: qty 8

## 2021-11-15 MED ORDER — DEXAMETHASONE SODIUM PHOSPHATE 10 MG/ML IJ SOLN
10.0000 mg | Freq: Once | INTRAMUSCULAR | Status: AC
  Administered 2021-11-15: 10 mg via INTRAVENOUS
  Filled 2021-11-15: qty 1

## 2021-11-15 MED ORDER — LIDOCAINE-EPINEPHRINE 1 %-1:100000 IJ SOLN
INTRAMUSCULAR | Status: AC
  Filled 2021-11-15: qty 1

## 2021-11-15 MED ORDER — CHLORHEXIDINE GLUCONATE 0.12 % MT SOLN: 15.0000 mL | Freq: Once | OROMUCOSAL | Status: AC

## 2021-11-15 MED ORDER — ONDANSETRON HCL 4 MG/2ML IJ SOLN
INTRAMUSCULAR | Status: DC | PRN
  Administered 2021-11-15: 4 mg via INTRAVENOUS

## 2021-11-15 MED ORDER — IBUPROFEN 100 MG/5ML PO SUSP: 600.0000 mg | Freq: Four times a day (QID) | ORAL | 0 refills | Status: AC

## 2021-11-15 MED ORDER — ACETAMINOPHEN 10 MG/ML IV SOLN
INTRAVENOUS | Status: DC | PRN
  Administered 2021-11-15: 1000 mg via INTRAVENOUS

## 2021-11-15 MED ORDER — DEXMEDETOMIDINE (PRECEDEX) IN NS 20 MCG/5ML (4 MCG/ML) IV SYRINGE
PREFILLED_SYRINGE | INTRAVENOUS | Status: DC | PRN
  Administered 2021-11-15: 4 ug via INTRAVENOUS
  Administered 2021-11-15 (×2): 8 ug via INTRAVENOUS

## 2021-11-15 MED ORDER — LIDOCAINE-EPINEPHRINE 1 %-1:100000 IJ SOLN
INTRAMUSCULAR | Status: DC | PRN
  Administered 2021-11-15: 10 mL

## 2021-11-15 MED ORDER — AMOXICILLIN-POT CLAVULANATE 250-62.5 MG/5ML PO SUSR: 875.0000 mg | Freq: Two times a day (BID) | ORAL | 0 refills | Status: AC

## 2021-11-15 MED ORDER — PROPOFOL 10 MG/ML IV BOLUS
INTRAVENOUS | Status: DC | PRN
  Administered 2021-11-15: 200 mg via INTRAVENOUS
  Administered 2021-11-15 (×2): 30 mg via INTRAVENOUS

## 2021-11-15 MED ORDER — FENTANYL CITRATE (PF) 250 MCG/5ML IJ SOLN
INTRAMUSCULAR | Status: AC
  Filled 2021-11-15: qty 5

## 2021-11-15 MED ORDER — DEXAMETHASONE SODIUM PHOSPHATE 10 MG/ML IJ SOLN
INTRAMUSCULAR | Status: DC | PRN
  Administered 2021-11-15: 10 mg via INTRAVENOUS

## 2021-11-15 MED ORDER — HYDROMORPHONE HCL 1 MG/ML IJ SOLN: 0.2500 mg | INTRAMUSCULAR | Status: DC | PRN

## 2021-11-15 SURGICAL SUPPLY — 32 items
0.7mm Minne ties ×1 IMPLANT
1.0Minne ties ×1 IMPLANT
BAG COUNTER SPONGE SURGICOUNT (BAG) ×2 IMPLANT
BLADE SURG 15 STRL LF DISP TIS (BLADE) IMPLANT
BLADE SURG 15 STRL SS (BLADE)
CANISTER SUCT 3000ML PPV (MISCELLANEOUS) ×2 IMPLANT
COVER SURGICAL LIGHT HANDLE (MISCELLANEOUS) ×2 IMPLANT
DRAPE HALF SHEET 40X57 (DRAPES) ×2 IMPLANT
GAUZE 4X4 16PLY ~~LOC~~+RFID DBL (SPONGE) IMPLANT
GLOVE BIO SURGEON STRL SZ 6.5 (GLOVE) ×4 IMPLANT
GLOVE BIOGEL PI IND STRL 7.0 (GLOVE) IMPLANT
GLOVE BIOGEL PI INDICATOR 7.0 (GLOVE) ×1
GLOVE ORTHO TXT STRL SZ7.5 (GLOVE) ×2 IMPLANT
GLOVE SURG SS PI 7.0 STRL IVOR (GLOVE) ×1 IMPLANT
GOWN STRL REUS W/ TWL LRG LVL3 (GOWN DISPOSABLE) ×2 IMPLANT
GOWN STRL REUS W/TWL LRG LVL3 (GOWN DISPOSABLE) ×4
KIT BASIN OR (CUSTOM PROCEDURE TRAY) ×2 IMPLANT
KIT TURNOVER KIT B (KITS) ×2 IMPLANT
NDL HYPO 25GX1X1/2 BEV (NEEDLE) IMPLANT
NDL PRECISIONGLIDE 27X1.5 (NEEDLE) ×1 IMPLANT
NEEDLE HYPO 25GX1X1/2 BEV (NEEDLE) ×2 IMPLANT
NEEDLE PRECISIONGLIDE 27X1.5 (NEEDLE) ×2 IMPLANT
NS IRRIG 1000ML POUR BTL (IV SOLUTION) ×2 IMPLANT
PAD ARMBOARD 7.5X6 YLW CONV (MISCELLANEOUS) ×4 IMPLANT
SCISSORS WIRE DISP (INSTRUMENTS) ×1 IMPLANT
SUCTION FRAZIER HANDLE 10FR (MISCELLANEOUS)
SUCTION TUBE FRAZIER 10FR DISP (MISCELLANEOUS) IMPLANT
SUT STEEL 2 (SUTURE) ×1 IMPLANT
SYR CONTROL 10ML LL (SYRINGE) ×2 IMPLANT
TRAY ENT MC OR (CUSTOM PROCEDURE TRAY) ×2 IMPLANT
TUBE CONNECTING 12X1/4 (SUCTIONS) ×2 IMPLANT
YANKAUER SUCT BULB TIP NO VENT (SUCTIONS) ×2 IMPLANT

## 2021-11-15 NOTE — Discharge Instructions (Signed)
Chlorhexidine rinses tid until further notice. Can brush with tooth brush three times daily.
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
## 2021-11-15 NOTE — Anesthesia Postprocedure Evaluation (Signed)
Anesthesia Post Note  Patient: Adam Novak  Procedure(s) Performed: CLOSED REDUCTION BILATERAL MANDIBLE (Bilateral: Mouth)     Patient location during evaluation: PACU Anesthesia Type: General Level of consciousness: sedated and patient cooperative Pain management: pain level controlled Vital Signs Assessment: post-procedure vital signs reviewed and stable Respiratory status: spontaneous breathing Cardiovascular status: stable Anesthetic complications: no   No notable events documented.  Last Vitals:  Vitals:   11/15/21 2040 11/15/21 2055  BP: (!) 141/101 (!) 145/84  Pulse: (!) 108 100  Resp: 12 (!) 25  Temp:    SpO2: 92% 99%    Last Pain:  Vitals:   11/15/21 2055  TempSrc:   PainSc: 0-No pain                 [name]

## 2021-11-15 NOTE — Transfer of Care (Signed)
Immediate Anesthesia Transfer of Care Note  Patient: Adam Novak  Procedure(s) Performed: CLOSED REDUCTION BILATERAL MANDIBLE (Bilateral: Mouth)  Patient Location: PACU  Anesthesia Type:General  Level of Consciousness: drowsy, patient cooperative and responds to stimulation  Airway & Oxygen Therapy: Patient Spontanous Breathing and Patient connected to face mask oxygen  Post-op Assessment: Report given to RN, Post -op Vital signs reviewed and stable and Patient moving all extremities X 4  Post vital signs: Reviewed and stable  Last Vitals:  Vitals Value Taken Time  BP 136/82 11/15/21 1954  Temp    Pulse 96 11/15/21 1957  Resp 13 11/15/21 1957  SpO2 100 % 11/15/21 1957  Vitals shown include unvalidated device data.  Last Pain:  Vitals:   11/15/21 1536  TempSrc:   PainSc: 7          Complications: No notable events documented.

## 2021-11-15 NOTE — Anesthesia Procedure Notes (Signed)
Procedure Name: Intubation Date/Time: 11/15/2021 6:35 PM Performed by: Rande Brunt, CRNA Pre-anesthesia Checklist: Patient identified, Emergency Drugs available, Suction available and Patient being monitored Patient Re-evaluated:Patient Re-evaluated prior to induction Oxygen Delivery Method: Circle System Utilized Preoxygenation: Pre-oxygenation with 100% oxygen Induction Type: IV induction Ventilation: Mask ventilation without difficulty Nasal Tubes: Nasal Rae and Nasal prep performed Endobronchial tube: Left Tube size: 7.5 mm Number of attempts: 1 Placement Confirmation: ETT inserted through vocal cords under direct vision, positive ETCO2 and breath sounds checked- equal and bilateral Tube secured with: Tape Dental Injury: Teeth and Oropharynx as per pre-operative assessment

## 2021-11-15 NOTE — Progress Notes (Signed)
BP 146/104; HR 100. No acute distress noted. Dr. Okey Dupre was notified. No new orders at this time.
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
## 2021-11-15 NOTE — Anesthesia Preprocedure Evaluation (Addendum)
Anesthesia Evaluation  Patient identified by MRN, date of birth, ID band Patient awake  General Assessment Comment:Fractured mandible  Reviewed: Allergy & Precautions, H&P , NPO status , Patient's Chart, lab work & pertinent test results  Airway Mallampati: III  TM Distance: >3 FB   Mouth opening: Limited Mouth Opening  Dental  (+) Poor Dentition, Loose   Pulmonary Current Smoker and Patient abstained from smoking.,    Pulmonary exam normal breath sounds clear to auscultation       Cardiovascular hypertension,  Rhythm:Regular Rate:Tachycardia  Untreated HTN  Pulse at baseline is between 100-120, no current tx   Neuro/Psych  Headaches, PSYCHIATRIC DISORDERS Anxiety Depression Bipolar Disorder negative neurological ROS     GI/Hepatic negative GI ROS, (+)     substance abuse (last cocaine use 4 days ago)  cocaine use and marijuana use,   Endo/Other  negative endocrine ROS  Renal/GU negative Renal ROS  negative genitourinary   Musculoskeletal negative musculoskeletal ROS (+)   Abdominal   Peds negative pediatric ROS (+)  Hematology negative hematology ROS (+)   Anesthesia Other Findings   Reproductive/Obstetrics negative OB ROS                         Anesthesia Physical Anesthesia Plan  ASA: 2  Anesthesia Plan: General   Post-op Pain Management: Ofirmev IV (intra-op)*, Toradol IV (intra-op)* and Precedex   Induction: Intravenous  PONV Risk Score and Plan: 2 and Ondansetron, Dexamethasone and Midazolam  Airway Management Planned: Video Laryngoscope Planned and Nasal ETT  Additional Equipment:   Intra-op Plan:   Post-operative Plan: Extubation in OR  Informed Consent: I have reviewed the patients History and Physical, chart, labs and discussed the procedure including the risks, benefits and alternatives for the proposed anesthesia with the patient or authorized representative who  has indicated his/her understanding and acceptance.     Dental advisory given  Plan Discussed with: CRNA and Surgeon  Anesthesia Plan Comments: (Left nare)      Anesthesia Quick Evaluation

## 2021-11-16 ENCOUNTER — Encounter (HOSPITAL_COMMUNITY): Payer: Self-pay | Admitting: Oral Surgery

## 2021-12-26 ENCOUNTER — Emergency Department (HOSPITAL_COMMUNITY)
Admission: EM | Admit: 2021-12-26 | Discharge: 2021-12-27 | Disposition: A | Discharge status: Home / Self Care | Payer: Self-pay | Attending: Emergency Medicine | Admitting: Emergency Medicine

## 2021-12-26 DIAGNOSIS — R45850 Homicidal ideations: Secondary | ICD-10-CM

## 2021-12-26 DIAGNOSIS — R45851 Suicidal ideations: Secondary | ICD-10-CM | POA: Insufficient documentation

## 2021-12-26 DIAGNOSIS — R443 Hallucinations, unspecified: Secondary | ICD-10-CM | POA: Insufficient documentation

## 2021-12-26 DIAGNOSIS — F10920 Alcohol use, unspecified with intoxication, uncomplicated: Secondary | ICD-10-CM

## 2021-12-26 DIAGNOSIS — Z20822 Contact with and (suspected) exposure to covid-19: Secondary | ICD-10-CM | POA: Insufficient documentation

## 2021-12-26 DIAGNOSIS — F430 Acute stress reaction: Secondary | ICD-10-CM

## 2021-12-26 DIAGNOSIS — I10 Essential (primary) hypertension: Secondary | ICD-10-CM | POA: Insufficient documentation

## 2021-12-26 DIAGNOSIS — R000 Tachycardia, unspecified: Secondary | ICD-10-CM | POA: Insufficient documentation

## 2021-12-26 DIAGNOSIS — F1094 Alcohol use, unspecified with alcohol-induced mood disorder: Secondary | ICD-10-CM

## 2021-12-26 DIAGNOSIS — Y908 Blood alcohol level of 240 mg/100 ml or more: Secondary | ICD-10-CM | POA: Insufficient documentation

## 2021-12-26 LAB — RAPID URINE DRUG SCREEN, HOSP PERFORMED
Amphetamines: NOT DETECTED
Barbiturates: NOT DETECTED
Benzodiazepines: NOT DETECTED
Cocaine: NOT DETECTED
Opiates: NOT DETECTED
Tetrahydrocannabinol: POSITIVE — AB

## 2021-12-26 LAB — COMPREHENSIVE METABOLIC PANEL
ALT: 25 U/L (ref 0–44)
AST: 35 U/L (ref 15–41)
Albumin: 5.1 g/dL — ABNORMAL HIGH (ref 3.5–5.0)
Alkaline Phosphatase: 82 U/L (ref 38–126)
Anion gap: 12 (ref 5–15)
BUN: <5 mg/dL — ABNORMAL LOW (ref 6–20)
CO2: 28 mmol/L (ref 22–32)
Calcium: 9.1 mg/dL (ref 8.9–10.3)
Chloride: 103 mmol/L (ref 98–111)
Creatinine, Ser: 1.05 mg/dL (ref 0.61–1.24)
GFR, Estimated: >60 mL/min (ref >60)
Glucose, Bld: 94 mg/dL (ref 70–99)
Potassium: 4.2 mmol/L (ref 3.5–5.1)
Sodium: 143 mmol/L (ref 135–145)
Total Bilirubin: 0.5 mg/dL (ref 0.3–1.2)
Total Protein: 8.3 g/dL — ABNORMAL HIGH (ref 6.5–8.1)

## 2021-12-26 LAB — SALICYLATE LEVEL: Salicylate Lvl: <7 mg/dL — ABNORMAL LOW (ref 7.0–30.0)

## 2021-12-26 LAB — CBC
HCT: 51.4 % (ref 39.0–52.0)
Hemoglobin: 18 g/dL — ABNORMAL HIGH (ref 13.0–17.0)
MCH: 34 pg (ref 26.0–34.0)
MCHC: 35 g/dL (ref 30.0–36.0)
MCV: 97 fL (ref 80.0–100.0)
Platelets: 243 10*3/uL (ref 150–400)
RBC: 5.3 MIL/uL (ref 4.22–5.81)
RDW: 13 % (ref 11.5–15.5)
WBC: 4 10*3/uL (ref 4.0–10.5)
nRBC: 0 % (ref 0.0–0.2)

## 2021-12-26 LAB — ETHANOL: Alcohol, Ethyl (B): 389 mg/dL (ref <10)

## 2021-12-26 LAB — ACETAMINOPHEN LEVEL: Acetaminophen (Tylenol), Serum: <10 ug/mL — ABNORMAL LOW (ref 10–30)

## 2021-12-26 MED ORDER — LORAZEPAM 1 MG PO TABS: 0.0000 mg | ORAL_TABLET | Freq: Two times a day (BID) | ORAL | Status: DC

## 2021-12-26 MED ORDER — THIAMINE HCL 100 MG/ML IJ SOLN: 100.0000 mg | Freq: Every day | INTRAMUSCULAR | Status: DC

## 2021-12-26 MED ORDER — THIAMINE HCL 100 MG PO TABS
100.0000 mg | ORAL_TABLET | Freq: Every day | ORAL | Status: DC
  Administered 2021-12-26 – 2021-12-27 (×2): 100 mg via ORAL
  Filled 2021-12-26 (×2): qty 1

## 2021-12-26 MED ORDER — LORAZEPAM 2 MG/ML IJ SOLN: 0.0000 mg | Freq: Two times a day (BID) | INTRAMUSCULAR | Status: DC

## 2021-12-26 MED ORDER — LORAZEPAM 1 MG PO TABS
0.0000 mg | ORAL_TABLET | Freq: Four times a day (QID) | ORAL | Status: DC
  Administered 2021-12-26 – 2021-12-27 (×2): 1 mg via ORAL
  Filled 2021-12-26 (×2): qty 1

## 2021-12-26 MED ORDER — LORAZEPAM 2 MG/ML IJ SOLN: 0.0000 mg | Freq: Four times a day (QID) | INTRAMUSCULAR | Status: DC
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
## 2021-12-26 NOTE — ED Notes (Signed)
Belongings in locker number 5

## 2021-12-26 NOTE — ED Notes (Signed)
IVC PAPERS COMPLETE 
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
## 2021-12-26 NOTE — ED Provider Notes (Signed)
[facility] EMERGENCY DEPARTMENT Provider Note   CSN: [id number] Arrival date & time: 12/26/21  1541     History  Chief Complaint  Patient presents with   Suicidal   Homicidal    Adam Novak is a 31 y.o. male.  HPI Patient presents reportedly suicidal and homicidal.  States has been feeling bad secondary to having his jaw wired shut.  States he is hearing some voices.  States he wants to kill a specific person but will not tell me what it is or what it is about.  Also some suicidal thoughts.  Apparently has a history of bipolar disorder.  Has been drinking some alcohol.   Past Medical History:  Diagnosis Date   Anxiety    Bipolar 1 disorder (HCC)    Depression    Headache    Heart murmur    Hypertension     Home Medications Prior to Admission medications   Medication Sig Start Date End Date Taking? Authorizing Provider  acetaminophen (TYLENOL) 500 MG tablet Take 500 mg by mouth every 6 (six) hours as needed for mild pain.    [provider]  chlorhexidine (PERIDEX) 0.12 % solution Use as directed 10 mLs in the mouth or throat 3 (three) times daily. 11/13/21   Harris, Cammy Copa, PA-C  oxyCODONE (ROXICODONE) 5 MG immediate release tablet Take 0.5-1 tablets (2.5-5 mg total) by mouth every 4 (four) hours as needed for severe pain. 11/13/21   Arthor Captain, PA-C      Allergies    Patient has no known allergies.    Review of Systems   Review of Systems  Physical Exam Updated Vital Signs BP 114/78 (BP Location: Right Arm)   Pulse 93   Temp 97.6 F (36.4 C) (Oral)   Resp 16   SpO2 100%  Physical Exam Vitals and nursing note reviewed.  HENT:     Mouth/Throat:     Comments: Jaw is wired shut. Cardiovascular:     Rate and Rhythm: Regular rhythm.  Pulmonary:     Breath sounds: No wheezing.  Musculoskeletal:        General: No tenderness.  Skin:    General: Skin is warm.  Neurological:     Mental Status: He is alert and oriented to  person, place, and time.  Psychiatric:        Mood and Affect: Mood normal.     ED Results / Procedures / Treatments   Labs (all labs ordered are listed, but only abnormal results are displayed) Labs Reviewed  COMPREHENSIVE METABOLIC PANEL - Abnormal; Notable for the following components:      Result Value   BUN <5 (*)    Total Protein 8.3 (*)    Albumin 5.1 (*)    All other components within normal limits  [name] - Abnormal; Notable for the following components:   Alcohol, Ethyl (B) 389 (*)    All other components within normal limits  SALICYLATE LEVEL - Abnormal; Notable for the following components:   Salicylate Lvl <7.0 (*)    All other components within normal limits  ACETAMINOPHEN LEVEL - Abnormal; Notable for the following components:   Acetaminophen (Tylenol), Serum <10 (*)    All other components within normal limits  CBC - Abnormal; Notable for the following components:   Hemoglobin 18.0 (*)    All other components within normal limits  RAPID URINE DRUG SCREEN, HOSP PERFORMED - Abnormal; Notable for the following components:   Tetrahydrocannabinol POSITIVE (*)  All other components within normal limits    EKG None  Radiology No results found.  Procedures Procedures    Medications Ordered in ED Medications - No data to display  ED Course/ Medical Decision Making/ A&P                           Medical Decision Making Amount and/or Complexity of Data Reviewed Labs: ordered.   Patient presents with suicidal homicidal thoughts along with some hallucinations.Lab work reviewed and overall reassuring.  Alcohol level is elevated as is his hemoglobin.  Vitals reassuring now.  Initial mild tachycardia.  Patient is medically cleared.  We will have patient seen by TTS.        Final Clinical Impression(s) / ED Diagnoses Final diagnoses:  Suicidal ideations  Homicidal ideations    Rx / DC Orders ED Discharge Orders     None         Benjiman Core, MD 12/26/21 [phone]

## 2021-12-27 ENCOUNTER — Encounter (HOSPITAL_COMMUNITY): Payer: Self-pay | Admitting: Emergency Medicine

## 2021-12-27 ENCOUNTER — Other Ambulatory Visit: Payer: Self-pay

## 2021-12-27 LAB — RESP PANEL BY RT-PCR (FLU A&B, COVID) ARPGX2
Influenza A by PCR: NEGATIVE
Influenza B by PCR: NEGATIVE
SARS Coronavirus 2 by RT PCR: NEGATIVE

## 2021-12-27 MED ORDER — KETOROLAC TROMETHAMINE 60 MG/2ML IM SOLN
60.0000 mg | Freq: Once | INTRAMUSCULAR | Status: AC
  Administered 2021-12-27: 60 mg via INTRAMUSCULAR
  Filled 2021-12-27: qty 2

## 2021-12-27 NOTE — ED Notes (Signed)
Pt currently in Hallway bed 12. Continues to sleep through the night after ativan administration. No labored breathing, no visible sweating. Hold CIWA assessment for now.

## 2021-12-27 NOTE — ED Notes (Addendum)
Pharmacist Christiane Ha confirmed Ativan and Vit B12 can be crushed. Medication for pt need to be liquid form or crush. Pt has wired jaw close.

## 2021-12-27 NOTE — ED Notes (Signed)
Pt moved to a private room for TTS assessment  

## 2021-12-27 NOTE — ED Provider Notes (Signed)
Emergency Medicine Observation Re-evaluation Note  Adam Novak is a 31 y.o. male, seen on rounds today.  Pt initially presented to the ED for complaints of etoh intoxication and related mood disorder. Pt indicates after drinking heavily, began to feel stressed and depressed. Indicates today, after metabolizing etoh, he feels back to baseline and much improved. Pt expresses optimism about getting jaw wires out next week, and about going to job tomorrow. Normal appetite.  Physical Exam  BP 126/88 (BP Location: Left Arm)   Pulse 71   Temp 98.6 F (37 C) (Oral)   Resp 17   SpO2 96%  Physical Exam General: alert, content, conversant.  Cardiac: regular rate. Lungs: breathing comfortably. Psych: normal mood and affect. Pt does not appear acutely depressed or despondent. No SI/HI.  Pt is not responding to internal stimuli - no acute psychosis is noted.  ED Course / MDM   I have reviewed the labs performed to date as well as medications administered while in observation.  Recent changes in the last 24 hours include ED obs, metabolism of etoh, recheck.   Plan   Pt reports feeling much improved and ready for d/c.    Vitals normal. No tremor, shakes or sweats. Normal mood and affect. No psychosis.   Pt currently appears stable for d/c.  Return precautions provided.       Cathren Laine, MD 12/27/21 1350

## 2021-12-27 NOTE — ED Notes (Signed)
Pt states he feels a lot better this morning, not feeling SI/HI, states he was drinking and upset when he said that and wants to go home. 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

## 2021-12-27 NOTE — Discharge Instructions (Addendum)
It was our pleasure to provide your ER care today - we hope that you feel better.  Avoid alcohol and/or drug use, as it is harmful to your physical health and mental well-being. See resource guide for treatment programs in the area.   Follow up with your dentist next week as planned.  Also follow up closely with primary care doctor and mental health specialist I the next 1-2 weeks.   For mental health issues and/or crisis, you may also go directly to [facility] - it is open 24/7 and walk-ins are welcome.   Return to ER if worse, new symptoms, fevers, chest pain, trouble breathing, or other emergency concern.

## 2021-12-27 NOTE — ED Notes (Signed)
Report received by previous RN. Pt currently pending IVC paperwork. Has been dressed out, belongings removed, and wanded by security. Pt is currently sleeping in assigned hallway bed 12. Previous RN voiced concerns d/t escalating behavior in previous shift. No medications given. RN to follow CIWA protocol. No sitter with pt.
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
## 2022-01-30 ENCOUNTER — Ambulatory Visit: Payer: Self-pay | Admitting: Family Medicine

## 2022-04-04 ENCOUNTER — Encounter: Payer: Self-pay | Admitting: Family Medicine

## 2022-04-04 ENCOUNTER — Ambulatory Visit (INDEPENDENT_AMBULATORY_CARE_PROVIDER_SITE_OTHER): Payer: Self-pay | Admitting: Family Medicine

## 2022-04-04 ENCOUNTER — Other Ambulatory Visit: Payer: Self-pay

## 2022-04-04 DIAGNOSIS — F1020 Alcohol dependence, uncomplicated: Secondary | ICD-10-CM

## 2022-04-04 DIAGNOSIS — R030 Elevated blood-pressure reading, without diagnosis of hypertension: Secondary | ICD-10-CM

## 2022-04-04 DIAGNOSIS — M25512 Pain in left shoulder: Secondary | ICD-10-CM

## 2022-04-04 DIAGNOSIS — S4992XA Unspecified injury of left shoulder and upper arm, initial encounter: Secondary | ICD-10-CM | POA: Insufficient documentation

## 2022-04-04 DIAGNOSIS — F314 Bipolar disorder, current episode depressed, severe, without psychotic features: Secondary | ICD-10-CM

## 2022-04-04 MED ORDER — SERTRALINE HCL 50 MG PO TABS: 50.0000 mg | ORAL_TABLET | Freq: Every day | ORAL | 0 refills | Status: DC

## 2022-04-04 MED ORDER — GABAPENTIN 100 MG PO CAPS: 100.0000 mg | ORAL_CAPSULE | Freq: Two times a day (BID) | ORAL | 0 refills | Status: DC

## 2022-04-04 NOTE — Patient Instructions (Addendum)
Good to see you today - Thank you for coming in  Things we discussed today:  L Shoulder - should slowly get better.  If not come back or call for an xray   Mood - take zoloft 50 mg one a day. If any symptoms of not being able to sleep or feeling really high then stop and let me know  Gabapentin take one twice a day   Cut back on alcohol as much as possible  Make an appointment with psychiatry provider, please contact:  [facility]  [address] [place], [place] Crisis [phone]    Make an appointment to see me in 1-2 weeks
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
## 2022-04-04 NOTE — Assessment & Plan Note (Signed)
Today likely due to pain from shoulder.  Recent labs ok.  Will monitor.  Perhaps start beta blocker at follow up

## 2022-04-04 NOTE — Assessment & Plan Note (Signed)
Currently working to stop.  Will restart gabapentin and likely increase dose at follow up

## 2022-04-04 NOTE — Assessment & Plan Note (Addendum)
Patient very hopeful can continue zoloft since he feels it really helps.  No history of mania and couldnot find any mention with Epic search.  He is reluctant to see psychiatry since it means missing work.  Will continue low dose zoloft with close follow up with warnings about mania. I think the risk is worth the possible gain.  Encourage him to make an appointment with psychiatry.  Will also restart gabapentin
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
## 2022-05-01 ENCOUNTER — Encounter: Payer: Self-pay | Admitting: Family Medicine

## 2022-05-01 ENCOUNTER — Ambulatory Visit (INDEPENDENT_AMBULATORY_CARE_PROVIDER_SITE_OTHER): Payer: Self-pay | Admitting: Family Medicine

## 2022-05-01 VITALS — BP 118/83 | HR 75 | Wt 176.6 lb

## 2022-05-01 DIAGNOSIS — F314 Bipolar disorder, current episode depressed, severe, without psychotic features: Secondary | ICD-10-CM

## 2022-05-01 DIAGNOSIS — S61411A Laceration without foreign body of right hand, initial encounter: Secondary | ICD-10-CM

## 2022-05-01 DIAGNOSIS — R030 Elevated blood-pressure reading, without diagnosis of hypertension: Secondary | ICD-10-CM

## 2022-05-01 DIAGNOSIS — S66921A Laceration of unspecified muscle, fascia and tendon at wrist and hand level, right hand, initial encounter: Secondary | ICD-10-CM

## 2022-05-01 DIAGNOSIS — S61419A Laceration without foreign body of unspecified hand, initial encounter: Secondary | ICD-10-CM | POA: Insufficient documentation

## 2022-05-01 DIAGNOSIS — Z202 Contact with and (suspected) exposure to infections with a predominantly sexual mode of transmission: Secondary | ICD-10-CM

## 2022-05-01 DIAGNOSIS — M25512 Pain in left shoulder: Secondary | ICD-10-CM

## 2022-05-01 MED ORDER — GABAPENTIN 100 MG PO CAPS: 100.0000 mg | ORAL_CAPSULE | Freq: Two times a day (BID) | ORAL | 0 refills | Status: DC

## 2022-05-01 MED ORDER — SERTRALINE HCL 50 MG PO TABS: 50.0000 mg | ORAL_TABLET | Freq: Every day | ORAL | 0 refills | Status: DC

## 2022-05-01 NOTE — Assessment & Plan Note (Signed)
At goal today.  Will monitor

## 2022-05-01 NOTE — Progress Notes (Unsigned)
    SUBJECTIVE:   CHIEF COMPLAINT / HPI:   Thumb Laceration Eight days ago struck a glass table top and lacerated his R hand.  Did not seek medical attention.  Since has not been able to extend his R thumb.  No fever red streaks or discharge   Bipolar affective disorder, depressed, severe (HCC) He feels this is some better with zoloft and gabapentin.  Has baseline feelings of life worth not living but no active suicidal ideation.  No new feelings of mania or problems sleeping or new impulse control issues   Alcohol use disorder, severe, dependence (HCC) Continues to drink almost daily but not as much he relates.  Taking gabapentin which he feels helps cravings some   Acute pain of left shoulder This has improved    OBJECTIVE:   BP 118/83   Pulse 75   Wt 176 lb 9.6 oz (80.1 kg)   SpO2 100%   BMI 24.63 kg/m   L Shoulder - FROM residual bruising around axilla improved  R Thumb - no active extension.  Can fully passively extend.  Healed laceration over extensor tendon   Psych:  Cognition and judgment appear intact. Alert, communicative  and cooperative with normal attention span and concentration. No apparent delusions, illusions, hallucinations   ASSESSMENT/PLAN:   Elevated blood pressure reading -     POCT urinalysis dipstick  Laceration of right hand involving tendon, initial encounter Assessment & Plan: Seems to have severed his R thumb extensor tendon.  Placed in a alumina foam splint in full extension.  Placed referral to orthopedics.  Will be challenging as he has no insurance.  Instructed him to always keep thumb in full extension when changing bandage.     Orders: -     Ambulatory referral to Hand Surgery  STD exposure -     HIV Antibody (routine testing w rflx)  Acute pain of left shoulder Assessment & Plan: Improving    Bipolar affective disorder, depressed, severe (HCC) Assessment & Plan: Seems to be responding a little to sertraline without signs of  mania.  Strongly encouraged him to follow up with psychiatry.  Will also continue gabapentin       Patient Instructions  Good to see you today - Thank you for coming in  Things we discussed today:  Thumb laceration - you need to be seen by hand surgery ASAP.  We will try to get you an appointment  I will call you if your lab tests are not normal.  Otherwise we will discuss them at your next visit.  To see a psychiatry provider, please contact:  [facility]  [address] [place], [place] [phone] Crisis [phone]    To schedule with a psychiatrist, please contact your insurance company to find an in-network psychiatrist.    Come back to see me in 3 weeks   Best number to reach you is [phone] (M) Fathers number    Carney Living, MD [facility]

## 2022-05-01 NOTE — Assessment & Plan Note (Signed)
Seems to be responding a little to sertraline without signs of mania.  Strongly encouraged him to follow up with psychiatry.  Will also continue gabapentin

## 2022-05-01 NOTE — Patient Instructions (Addendum)
Good to see you today - Thank you for coming in  Things we discussed today:  Thumb laceration - you need to be seen by hand surgery ASAP.  We will try to get you an appointment  I will call you if your lab tests are not normal.  Otherwise we will discuss them at your next visit.  To see a psychiatry provider, please contact:  [facility]  [address] [place], [place] [phone] Crisis [phone]    To schedule with a psychiatrist, please contact your insurance company to find an in-network psychiatrist.    Come back to see me in 3 weeks   Best number to reach you is [phone] (M) Fathers number

## 2022-05-01 NOTE — Assessment & Plan Note (Signed)
Seems to have severed his R thumb extensor tendon.  Placed in a alumina foam splint in full extension.  Placed referral to orthopedics.  Will be challenging as he has no insurance.  Instructed him to always keep thumb in full extension when changing bandage.

## 2022-05-01 NOTE — Assessment & Plan Note (Signed)
Improving.

## 2022-05-02 ENCOUNTER — Telehealth: Payer: Self-pay | Admitting: Family Medicine

## 2022-05-02 LAB — HIV ANTIBODY (ROUTINE TESTING W REFLEX): HIV Screen 4th Generation wRfx: NONREACTIVE

## 2022-05-02 NOTE — Telephone Encounter (Signed)
Spoke with father at [phone]   Let him know to be on the look out for call from hand surgeon  Also to consider having Hawkin apply for medicaid with the expansion on Dec 1

## 2022-06-01 ENCOUNTER — Encounter: Payer: Self-pay | Admitting: Family Medicine

## 2022-09-26 ENCOUNTER — Other Ambulatory Visit: Payer: Self-pay

## 2022-09-26 ENCOUNTER — Emergency Department (HOSPITAL_COMMUNITY): Payer: Medicaid Other

## 2022-09-26 ENCOUNTER — Emergency Department (HOSPITAL_COMMUNITY)
Admission: EM | Admit: 2022-09-26 | Discharge: 2022-09-26 | Disposition: A | Discharge status: Home / Self Care | Payer: Medicaid Other | Attending: Emergency Medicine | Admitting: Emergency Medicine

## 2022-09-26 DIAGNOSIS — F10121 Alcohol abuse with intoxication delirium: Secondary | ICD-10-CM | POA: Insufficient documentation

## 2022-09-26 DIAGNOSIS — F10129 Alcohol abuse with intoxication, unspecified: Secondary | ICD-10-CM | POA: Diagnosis present

## 2022-09-26 DIAGNOSIS — F10921 Alcohol use, unspecified with intoxication delirium: Secondary | ICD-10-CM

## 2022-09-26 DIAGNOSIS — Y908 Blood alcohol level of 240 mg/100 ml or more: Secondary | ICD-10-CM | POA: Diagnosis not present

## 2022-09-26 LAB — COMPREHENSIVE METABOLIC PANEL
ALT: 17 U/L (ref 0–44)
AST: 26 U/L (ref 15–41)
Albumin: 3.8 g/dL (ref 3.5–5.0)
Alkaline Phosphatase: 48 U/L (ref 38–126)
Anion gap: 16 — ABNORMAL HIGH (ref 5–15)
BUN: 13 mg/dL (ref 6–20)
CO2: 24 mmol/L (ref 22–32)
Calcium: 8.3 mg/dL — ABNORMAL LOW (ref 8.9–10.3)
Chloride: 102 mmol/L (ref 98–111)
Creatinine, Ser: 0.98 mg/dL (ref 0.61–1.24)
GFR, Estimated: >60 mL/min (ref >60)
Glucose, Bld: 100 mg/dL — ABNORMAL HIGH (ref 70–99)
Potassium: 3 mmol/L — ABNORMAL LOW (ref 3.5–5.1)
Sodium: 142 mmol/L (ref 135–145)
Total Bilirubin: 0.6 mg/dL (ref 0.3–1.2)
Total Protein: 6.2 g/dL — ABNORMAL LOW (ref 6.5–8.1)

## 2022-09-26 LAB — CBC WITH DIFFERENTIAL/PLATELET
Abs Immature Granulocytes: 0.03 10*3/uL (ref 0.00–0.07)
Basophils Absolute: 0 10*3/uL (ref 0.0–0.1)
Basophils Relative: 1 %
Eosinophils Absolute: 0.1 10*3/uL (ref 0.0–0.5)
Eosinophils Relative: 1 %
HCT: 39.5 % (ref 39.0–52.0)
Hemoglobin: 13.9 g/dL (ref 13.0–17.0)
Immature Granulocytes: 0 %
Lymphocytes Relative: 43 %
Lymphs Abs: 3.2 10*3/uL (ref 0.7–4.0)
MCH: 34.2 pg — ABNORMAL HIGH (ref 26.0–34.0)
MCHC: 35.2 g/dL (ref 30.0–36.0)
MCV: 97.1 fL (ref 80.0–100.0)
Monocytes Absolute: 0.6 10*3/uL (ref 0.1–1.0)
Monocytes Relative: 8 %
Neutro Abs: 3.5 10*3/uL (ref 1.7–7.7)
Neutrophils Relative %: 47 %
Platelets: 227 10*3/uL (ref 150–400)
RBC: 4.07 MIL/uL — ABNORMAL LOW (ref 4.22–5.81)
RDW: 13.4 % (ref 11.5–15.5)
WBC: 7.4 10*3/uL (ref 4.0–10.5)
nRBC: 0 % (ref 0.0–0.2)

## 2022-09-26 LAB — RAPID URINE DRUG SCREEN, HOSP PERFORMED
Amphetamines: NOT DETECTED
Barbiturates: NOT DETECTED
Benzodiazepines: NOT DETECTED
Cocaine: NOT DETECTED
Opiates: NOT DETECTED
Tetrahydrocannabinol: POSITIVE — AB

## 2022-09-26 LAB — LACTIC ACID, PLASMA: Lactic Acid, Venous: 2.1 mmol/L (ref 0.5–1.9)

## 2022-09-26 LAB — ETHANOL: Alcohol, Ethyl (B): 310 mg/dL (ref <10)

## 2022-09-26 LAB — SALICYLATE LEVEL: Salicylate Lvl: <7 mg/dL — ABNORMAL LOW (ref 7.0–30.0)

## 2022-09-26 LAB — ACETAMINOPHEN LEVEL: Acetaminophen (Tylenol), Serum: <10 ug/mL — ABNORMAL LOW (ref 10–30)

## 2022-09-26 LAB — CK: Total CK: 438 U/L — ABNORMAL HIGH (ref 49–397)

## 2022-09-26 LAB — AMMONIA: Ammonia: 29 umol/L (ref 9–35)

## 2022-09-26 MED ORDER — LACTATED RINGERS IV BOLUS
1000.0000 mL | Freq: Once | INTRAVENOUS | Status: AC
  Administered 2022-09-26: 1000 mL via INTRAVENOUS

## 2022-09-26 MED ORDER — SODIUM CHLORIDE 0.9 % IV BOLUS
1000.0000 mL | Freq: Once | INTRAVENOUS | Status: AC
  Administered 2022-09-26: 1000 mL via INTRAVENOUS

## 2022-09-26 NOTE — ED Provider Notes (Signed)
[place] EMERGENCY DEPARTMENT AT [facility] Provider Note   CSN: [id number] Arrival date & time: 09/26/22  0356     History  Chief Complaint  Patient presents with   Alcohol Intoxication    Adam Novak is a 32 y.o. male.  Patient brought to the emergency department for evaluation of altered mental status.  He is accompanied by police.  Patient apparently broke into his parents home tonight.  When police came to get him out of the home, he barricaded himself in the house.  Police report that he was in the house for approximately 4 hours before SWAT came to get him out.  He was surrounded by empty liquor bottles.  It is unclear if he took any drugs.  Patient has been unable to answer because of altered mental status during transport.  EMS reports that initially he had some snoring respirations which resolved with repositioning.  Respiratory rate has been okay.  Blood pressure is okay.       Home Medications Prior to Admission medications   Medication Sig Start Date End Date Taking? Authorizing Provider  gabapentin (NEURONTIN) 100 MG capsule Take 1 capsule (100 mg total) by mouth 2 (two) times daily. 05/01/22   Carney Living, MD  sertraline (ZOLOFT) 50 MG tablet Take 1 tablet (50 mg total) by mouth daily. 05/01/22   Carney Living, MD      Allergies    Patient has no known allergies.    Review of Systems   Review of Systems  Physical Exam Updated Vital Signs BP (!) 141/99   Pulse (!) 124   Temp 98.3 F (36.8 C) (Oral)   Resp (!) 32   SpO2 95%  Physical Exam Vitals and nursing note reviewed.  Constitutional:      General: He is not in acute distress.    Appearance: He is well-developed.  HENT:     Head: Normocephalic and atraumatic.     Mouth/Throat:     Mouth: Mucous membranes are moist.  Eyes:     General: Vision grossly intact. Gaze aligned appropriately.     Extraocular Movements: Extraocular movements intact.      Conjunctiva/sclera: Conjunctivae normal.  Cardiovascular:     Rate and Rhythm: Normal rate and regular rhythm.     Pulses: Normal pulses.     Heart sounds: Normal heart sounds, S1 normal and S2 normal. No murmur heard.    No friction rub. No gallop.  Pulmonary:     Effort: Pulmonary effort is normal. No respiratory distress.     Breath sounds: Normal breath sounds.  Abdominal:     Palpations: Abdomen is soft.     Tenderness: There is no abdominal tenderness. There is no guarding or rebound.     Hernia: No hernia is present.  Musculoskeletal:        General: No swelling.     Cervical back: Full passive range of motion without pain, normal range of motion and neck supple. No pain with movement, spinous process tenderness or muscular tenderness. Normal range of motion.     Right lower leg: No edema.     Left lower leg: No edema.  Skin:    General: Skin is warm and dry.     Capillary Refill: Capillary refill takes less than 2 seconds.     Findings: No ecchymosis, erythema, lesion or wound.  Neurological:     Mental Status: He is lethargic.     GCS: GCS eye subscore  is 4. GCS verbal subscore is 5. GCS motor subscore is 6.     Cranial Nerves: Cranial nerves 2-12 are intact.     Motor: No weakness or abnormal muscle tone.  Psychiatric:        Attention and Perception: He is inattentive.     ED Results / Procedures / Treatments   Labs (all labs ordered are listed, but only abnormal results are displayed) Labs Reviewed  CBC WITH DIFFERENTIAL/PLATELET - Abnormal; Notable for the following components:      Result Value   RBC 4.07 (*)    MCH 34.2 (*)    All other components within normal limits  COMPREHENSIVE METABOLIC PANEL - Abnormal; Notable for the following components:   Potassium 3.0 (*)    Glucose, Bld 100 (*)    Calcium 8.3 (*)    Total Protein 6.2 (*)    Anion gap 16 (*)    All other components within normal limits  LACTIC ACID, PLASMA - Abnormal; Notable for the  following components:   Lactic Acid, Venous 2.1 (*)    All other components within normal limits  CK - Abnormal; Notable for the following components:   Total CK 438 (*)    All other components within normal limits  SALICYLATE LEVEL - Abnormal; Notable for the following components:   Salicylate Lvl <7.0 (*)    All other components within normal limits  ACETAMINOPHEN LEVEL - Abnormal; Notable for the following components:   Acetaminophen (Tylenol), Serum <10 (*)    All other components within normal limits  RAPID URINE DRUG SCREEN, HOSP PERFORMED - Abnormal; Notable for the following components:   Tetrahydrocannabinol POSITIVE (*)    All other components within normal limits  [name] - Abnormal; Notable for the following components:   Alcohol, Ethyl (B) 310 (*)    All other components within normal limits  AMMONIA    EKG EKG Interpretation  Date/Time:  Wednesday September 26 2022 04:02:53 EDT Ventricular Rate:  129 PR Interval:  125 QRS Duration: 105 QT Interval:  329 QTC Calculation: 482 R Axis:   -48 Text Interpretation: Sinus tachycardia LAD, consider left anterior fascicular block Borderline prolonged QT interval Confirmed by Gilda Crease [phone]) on 09/26/2022 4:31:21 AM  Radiology CT HEAD WO CONTRAST ( )  Result Date: 09/26/2022 CLINICAL DATA:  32 year old male with intoxication. Ingestion of unknown substance. Altered mental status, barricaded himself in parents house x 4 hours. PD entered home to find patient lethargic. EXAM: CT HEAD WITHOUT CONTRAST TECHNIQUE: Contiguous axial images were obtained from the base of the skull through the vertex without intravenous contrast. RADIATION DOSE REDUCTION: This exam was performed according to the departmental dose-optimization program which includes automated exposure control, adjustment of the mA and/or kV according to patient size and/or use of iterative reconstruction technique. COMPARISON:  Head CT 11/13/2021 and earlier.  FINDINGS: Brain: Normal cerebral volume. Mild streak artifact today. No midline shift, ventriculomegaly, mass effect, evidence of mass lesion, intracranial hemorrhage or evidence of cortically based acute infarction. Gray-white differentiation remains within normal limits when allowing for streak artifact today. Vascular: Limited by streak artifact. No obvious abnormal hyperdense vessel. Skull: Intact.  No acute osseous abnormality identified. Sinuses/Orbits: Ethmoid and frontoethmoidal recess mucosal thickening has mildly progressed from last year. No sinus fluid levels. Tympanic cavities and mastoids are clear. Other: No acute orbit or scalp soft tissue finding. IMPRESSION: 1. Stable and negative noncontrast CT appearance of the brain when allowing for streak artifact today. 2. Paranasal sinus  inflammation has mildly progressed from last year. Electronically Signed   By: Odessa Fleming M.D.   On: 09/26/2022 05:20   DG Chest Port 1 View  Result Date: 09/26/2022 CLINICAL DATA:  Unresponsive 32 year old male. EXAM: PORTABLE CHEST 1 VIEW COMPARISON:  Portable chest 11/13/2021 FINDINGS: Multiple overlying monitor wires. The heart size and mediastinal contours are within normal limits. There are interval increased linear and hazy opacities in the left lower lung field with background low lung volumes. This could be due to atelectasis, pneumonia or combination. Attention on follow-up films is recommended. A study in full inspiration will be needed when clinically feasible. No pleural effusion is seen and the remaining lungs are clear. The visualized skeletal structures are unremarkable. IMPRESSION: Interval increased linear and hazy opacities in the left lower lung field with background low lung volumes. This could be due to atelectasis, pneumonia or combination. Attention on follow-up films is recommended. Electronically Signed   By: Almira Bar M.D.   On: 09/26/2022 04:57    Procedures Procedures    Medications  Ordered in ED Medications  lactated ringers bolus 1,000 mL (1,000 mLs Intravenous New Bag/Given 09/26/22 0620)    ED Course/ Medical Decision Making/ A&P                             Medical Decision Making Amount and/or Complexity of Data Reviewed Labs: ordered. Radiology: ordered.   Differential diagnosis considered includes, but not limited to: TIA; Stroke; ICH; Seizure; electrolyte abnormality; hypoglycemia; toxic/pharmacologic causes; CNS infection; psychiatric disorder   Brought to the emergency department with altered level of consciousness.  Patient is accompanied by police.  Patient reportedly had broken into his parents home and then boarded himself up in the house for 4 hours after police were called.  They did find an empty alcohol bottles at the scene, unclear if he took any other drugs.  Level, patient's behavior is bizarre.  His eyes are open and he has purposeful movements of his extremities, but his eyes are glazed and he seems to be having trouble focusing.  He does not answer questions or follow commands.  He is protecting his airway.  CT head without abnormality.  Chest x-ray clear.  Lab work reveals significant alcohol intoxication, no other findings.  Drug screen positive for THC, no other findings but there certainly are many drugs that could be in his system that will not show up.  Will need to metabolize.  Will sign out to the oncoming ER physician to follow.        Final Clinical Impression(s) / ED Diagnoses Final diagnoses:  Alcohol intoxication with delirium    Rx / DC Orders ED Discharge Orders     None         Peniel Biel, Canary Brim, MD 09/26/22 [phone]

## 2022-09-26 NOTE — ED Provider Notes (Signed)
7:45 AM Care assumed from Dr. Blinda Leatherwood.  At time of transfer of care, patient is awaiting metabolism of the EtOH prior to likely discharge into custody of law enforcement.  Patient had workup initially that was overall reassuring aside from the elevated EtOH.  Patient felt to be medically clear otherwise.  Plan of care is discharge and patient metabolize and able to safely ambulate and passed p.o. challenge.  12:41 PM Patient now more awake and answering some questions.  He refused eating and drinking and cursed at me.  He is moving all extremities vigorously.  Given his improvement in mental status and his otherwise well appearance after otherwise reassuring workup, we feel he is safe for discharge to the care of law enforcement.  Along fourth and evaluated him and agree he appears safe and stable for their care at this time.  Will discharge per previous team plan.   Clinical Impression: 1. Alcohol intoxication with delirium     Disposition: Discharge  Condition: Good  I have discussed the results, Dx and Tx plan with the pt(& family if present). He/she/they expressed understanding and agree(s) with the plan. Discharge instructions discussed at great length. Strict return precautions discussed and pt &/or family have verbalized understanding of the instructions. No further questions at time of discharge.    New Prescriptions   No medications on file    Follow Up: No follow-up provider specified.    Adam Novak, Canary Brim, MD 09/26/22 1247

## 2022-09-26 NOTE — ED Notes (Signed)
Pt transported to and from Ct by this RN. PD remains at bedside

## 2022-09-26 NOTE — ED Notes (Signed)
Pt is awake and moving around the bed at this time. Police at bedside and pt is on forensic restraints to bilateral wrists. Pt keeps on moving on bed attempting to get out of bed. Unable to redirect pt as pt unable to follow commands. Not on acute distress. Cardiac monitoring continued. MD is aware.

## 2022-09-26 NOTE — ED Notes (Signed)
Portable XR at bedside

## 2022-09-26 NOTE — ED Notes (Signed)
[phone] Pt mom called for a update

## 2022-09-26 NOTE — ED Triage Notes (Signed)
Patient BIB [facility] from home in PD custody with complaint of ingestion of unknown substance, EMS reports alcohol on scene. Per PD patient was intoxicated.  Per EMS patient barricaded himself in parents house x 4 hours. PD entered home to find patient lethargic.  Patient non verbal on arrival, maintaining airway.

## 2022-09-26 NOTE — ED Notes (Signed)
PD remains at bedside. Pt in bilateral wrist handcuffs. HOB >45. Remains on VS monitoring.

## 2022-09-26 NOTE — Discharge Instructions (Signed)
Please follow-up with your primary doctor for further management.

## 2023-02-19 ENCOUNTER — Ambulatory Visit: Payer: Medicaid Other | Admitting: Family Medicine

## 2023-02-19 NOTE — Progress Notes (Deleted)
    SUBJECTIVE:   CHIEF COMPLAINT / HPI:   ***  PERTINENT  PMH / PSH: *** Patient Active Problem List   Diagnosis Date Noted   Hand laceration involving tendon 05/01/2022   Left shoulder pain 04/04/2022   Tobacco abuse 11/18/2019   Elevated blood pressure reading 11/17/2019   Polysubstance dependence (HCC) 12/21/2016   Bipolar affective disorder, depressed, severe (HCC) 08/07/2016   Alcohol use disorder, severe, dependence (HCC) 12/15/2015    Current Outpatient Medications  Medication Instructions   gabapentin (NEURONTIN) 100 mg, Oral, 2 times daily   sertraline (ZOLOFT) 50 mg, Oral, Daily       09/26/2022    9:04 AM 09/26/2022    8:00 AM 09/26/2022    7:00 AM  Vitals with BMI  Systolic 137 130 595  Diastolic 96 104 98  Pulse 125 638 125      OBJECTIVE:   There were no vitals taken for this visit.  ***  ASSESSMENT/PLAN:   There are no diagnoses linked to this encounter.   There are no Patient Instructions on file for this visit.   Carney Living, MD [facility]

## 2023-02-26 ENCOUNTER — Encounter: Payer: Medicaid Other | Admitting: Family Medicine

## 2023-08-15 ENCOUNTER — Encounter (HOSPITAL_COMMUNITY): Payer: Self-pay | Admitting: Behavioral Health

## 2023-08-15 ENCOUNTER — Ambulatory Visit (HOSPITAL_COMMUNITY)
Admission: EM | Admit: 2023-08-15 | Discharge: 2023-08-15 | Disposition: A | Discharge status: Home / Self Care | Payer: MEDICAID | Attending: Behavioral Health | Admitting: Behavioral Health

## 2023-08-15 DIAGNOSIS — F1020 Alcohol dependence, uncomplicated: Secondary | ICD-10-CM | POA: Insufficient documentation

## 2023-08-15 DIAGNOSIS — F319 Bipolar disorder, unspecified: Secondary | ICD-10-CM | POA: Insufficient documentation

## 2023-08-15 DIAGNOSIS — Z789 Other specified health status: Secondary | ICD-10-CM

## 2023-08-15 DIAGNOSIS — Z79899 Other long term (current) drug therapy: Secondary | ICD-10-CM | POA: Insufficient documentation

## 2023-08-15 DIAGNOSIS — Z652 Problems related to release from prison: Secondary | ICD-10-CM | POA: Insufficient documentation

## 2023-08-15 DIAGNOSIS — F429 Obsessive-compulsive disorder, unspecified: Secondary | ICD-10-CM | POA: Insufficient documentation

## 2023-08-15 DIAGNOSIS — F603 Borderline personality disorder: Secondary | ICD-10-CM | POA: Insufficient documentation

## 2023-08-15 DIAGNOSIS — F4310 Post-traumatic stress disorder, unspecified: Secondary | ICD-10-CM | POA: Insufficient documentation

## 2023-08-15 DIAGNOSIS — Z760 Encounter for issue of repeat prescription: Secondary | ICD-10-CM | POA: Insufficient documentation
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
## 2023-08-15 NOTE — Discharge Instructions (Addendum)
 New Patient Assessment/Therapy Walk-Ins:  Monday and Wednesday: 8 AM until slots are full. Every 1st and 2nd Fridays of the month: 1 PM - 5 PM.  NO ASSESSMENT/THERAPY WALK-INS ON TUESDAYS OR THURSDAYS  New Patient Assessment/Medication Management Walk-Ins:  Monday - Friday:  8 AM - 11 AM  For all walk-ins, we ask that you arrive by 7:15 am because patients will be seen in the order of arrival.  Availability is limited; therefore, you may not be seen on the same day that you walk-in.  Our goal is to serve and meet the needs of our community to the best of our ability.

## 2023-08-15 NOTE — Progress Notes (Signed)
   08/15/23 1228  [facility] Triage Screening (Walk-ins at [facility] only)  How Did You Hear About [place]? Legal System  What Is the Reason for Your Visit/Call Today? Adam Novak presents to [facility] voluntarily unaccompanied. Pt states that he was ordered by his PO to get a mental health assessment. Pt states that he just bonded out of jail 3 days ago and needs medication refills. Pt currently denies SI, HI, AVH and alcohol/drug use.  How Long Has This Been Causing You Problems? 1-6 months  Have You Recently Had Any Thoughts About Hurting Yourself? No  Are You Planning to Commit Suicide/Harm Yourself At This time? No  Have you Recently Had Thoughts About Hurting Someone Adam Novak? No  Are You Planning To Harm Someone At This Time? No  Physical Abuse Denies  Verbal Abuse Denies  Sexual Abuse Denies  Exploitation of patient/patient's resources Denies  Self-Neglect Denies  Are you currently experiencing any auditory, visual or other hallucinations? No  Have You Used Any Alcohol or Drugs in the Past 24 Hours? No  Do you have any current medical co-morbidities that require immediate attention? No  Clinician description of patient physical appearance/behavior: calm, cooperative, groomed  What Do You Feel Would Help You the Most Today? Medication(s);Social Support  If access to [facility] was not available, would you have sought care in the Emergency Department? No  Determination of Need Routine (7 days)  Options For Referral Medication Management;Outpatient Therapy

## 2023-08-15 NOTE — ED Notes (Signed)
 Patient discharged by provider.

## 2023-09-03 ENCOUNTER — Other Ambulatory Visit: Payer: Self-pay

## 2023-09-03 ENCOUNTER — Ambulatory Visit (INDEPENDENT_AMBULATORY_CARE_PROVIDER_SITE_OTHER): Payer: MEDICAID | Admitting: Physician Assistant

## 2023-09-03 ENCOUNTER — Encounter (HOSPITAL_COMMUNITY): Payer: Self-pay | Admitting: Physician Assistant

## 2023-09-03 VITALS — BP 130/84 | HR 62 | Temp 97.7°F | Ht 71.0 in | Wt 194.6 lb

## 2023-09-03 DIAGNOSIS — F314 Bipolar disorder, current episode depressed, severe, without psychotic features: Secondary | ICD-10-CM | POA: Diagnosis not present

## 2023-09-03 DIAGNOSIS — F4310 Post-traumatic stress disorder, unspecified: Secondary | ICD-10-CM | POA: Diagnosis not present

## 2023-09-03 DIAGNOSIS — Z758 Other problems related to medical facilities and other health care: Secondary | ICD-10-CM | POA: Insufficient documentation

## 2023-09-03 DIAGNOSIS — F422 Mixed obsessional thoughts and acts: Secondary | ICD-10-CM

## 2023-09-03 DIAGNOSIS — F411 Generalized anxiety disorder: Secondary | ICD-10-CM | POA: Diagnosis not present

## 2023-09-03 MED ORDER — SERTRALINE HCL 50 MG PO TABS
ORAL_TABLET | ORAL | 0 refills | Status: DC
  Filled 2023-09-03: qty 72, 30d supply, fill #0

## 2023-09-03 MED ORDER — SERTRALINE HCL 50 MG PO TABS
150.0000 mg | ORAL_TABLET | Freq: Every day | ORAL | 1 refills | Status: DC
Start: 2023-10-03 — End: 2023-09-13
  Filled 2023-09-03: qty 90, 30d supply, fill #0

## 2023-09-03 MED ORDER — HYDROXYZINE HCL 25 MG PO TABS
25.0000 mg | ORAL_TABLET | Freq: Three times a day (TID) | ORAL | 1 refills | Status: DC | PRN
  Filled 2023-09-03: qty 75, 25d supply, fill #0
  Filled 2023-10-25: qty 75, 25d supply, fill #1

## 2023-09-03 MED ORDER — BUSPIRONE HCL 7.5 MG PO TABS
7.5000 mg | ORAL_TABLET | Freq: Two times a day (BID) | ORAL | 1 refills | Status: DC
Start: 2023-09-03 — End: 2023-09-03

## 2023-09-03 MED ORDER — HYDROXYZINE HCL 25 MG PO TABS
25.0000 mg | ORAL_TABLET | Freq: Three times a day (TID) | ORAL | 1 refills | Status: DC | PRN
Start: 2023-09-03 — End: 2023-09-03

## 2023-09-03 MED ORDER — TRAZODONE HCL 50 MG PO TABS
50.0000 mg | ORAL_TABLET | Freq: Every day | ORAL | 1 refills | Status: DC
  Filled 2023-09-03: qty 30, 30d supply, fill #0
  Filled 2023-10-25: qty 30, 30d supply, fill #1

## 2023-09-03 MED ORDER — OLANZAPINE 5 MG PO TABS
5.0000 mg | ORAL_TABLET | Freq: Every day | ORAL | 1 refills | Status: DC
  Filled 2023-09-03: qty 30, 30d supply, fill #0
  Filled 2023-10-25: qty 30, 30d supply, fill #1

## 2023-09-03 MED ORDER — SERTRALINE HCL 50 MG PO TABS
ORAL_TABLET | ORAL | 0 refills | Status: DC
Start: 2023-09-03 — End: 2023-09-03

## 2023-09-03 MED ORDER — BUSPIRONE HCL 7.5 MG PO TABS
7.5000 mg | ORAL_TABLET | Freq: Two times a day (BID) | ORAL | 1 refills | Status: DC
  Filled 2023-09-03: qty 60, 30d supply, fill #0
  Filled 2023-10-25: qty 60, 30d supply, fill #1

## 2023-09-03 MED ORDER — TRAZODONE HCL 50 MG PO TABS
50.0000 mg | ORAL_TABLET | Freq: Every day | ORAL | 1 refills | Status: DC
Start: 2023-09-03 — End: 2023-09-03

## 2023-09-03 MED ORDER — OLANZAPINE 5 MG PO TABS
5.0000 mg | ORAL_TABLET | Freq: Every day | ORAL | 1 refills | Status: DC
Start: 2023-09-03 — End: 2023-09-03

## 2023-09-03 MED ORDER — SERTRALINE HCL 50 MG PO TABS
150.0000 mg | ORAL_TABLET | Freq: Every day | ORAL | 1 refills | Status: DC
Start: 2023-10-03 — End: 2023-09-03
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
## 2023-09-09 ENCOUNTER — Telehealth (HOSPITAL_COMMUNITY): Payer: Self-pay

## 2023-09-09 NOTE — Telephone Encounter (Addendum)
 Pts Olanzapine 5 mg tablets has been started 03/31   Pts Olanzapine 5 mg tablets has been approved until 09/08/2024  JNL, CMA

## 2023-09-10 ENCOUNTER — Telehealth (HOSPITAL_COMMUNITY): Payer: Self-pay | Admitting: *Deleted

## 2023-09-10 NOTE — Telephone Encounter (Signed)
 Fax received for PA denial of Sertraline 150mg  due to quantity limit exceeded. Peer to Peer can be done at [phone] or could rewrite for 150mg  capsules.

## 2023-09-13 ENCOUNTER — Other Ambulatory Visit (HOSPITAL_COMMUNITY): Payer: Self-pay | Admitting: Physician Assistant

## 2023-09-13 ENCOUNTER — Other Ambulatory Visit: Payer: Self-pay

## 2023-09-13 DIAGNOSIS — F4310 Post-traumatic stress disorder, unspecified: Secondary | ICD-10-CM

## 2023-09-13 DIAGNOSIS — F411 Generalized anxiety disorder: Secondary | ICD-10-CM

## 2023-09-13 DIAGNOSIS — F422 Mixed obsessional thoughts and acts: Secondary | ICD-10-CM

## 2023-09-13 MED ORDER — SERTRALINE HCL 100 MG PO TABS
150.0000 mg | ORAL_TABLET | Freq: Every day | ORAL | 1 refills | Status: DC
  Filled 2023-09-13 – 2023-10-25 (×2): qty 45, 30d supply, fill #0

## 2023-09-13 NOTE — Telephone Encounter (Signed)
 Message acknowledged and reviewed.

## 2023-09-13 NOTE — Progress Notes (Signed)
 Provider was contacted by Elder Love, RN due to patient's medication being unable to be filled due to prescription instructions.  Provider was instructed to change description so that the medication can be filled.  Provider to rewrite prescription for sertraline 150 mg (100 mg tablet and a half) for the management of the patient's anxiety.

## 2023-09-18 ENCOUNTER — Telehealth (HOSPITAL_COMMUNITY): Payer: Self-pay | Admitting: *Deleted

## 2023-09-18 NOTE — Telephone Encounter (Signed)
 Fax received for PA of Sertraline. Submitted online with cover my meds. Awaiting decision.

## 2023-10-08 NOTE — Progress Notes (Deleted)
 [facility] MD Outpatient Progress Note  10/08/2023 2:42 PM Adam Novak  MRN:  [id number]  Assessment:  Adam Novak is a 33 y.o. male with a history of bipolar 1 disorder, borderline personality disorder, PTSD, cannabis use disorder, alcohol abuse, hallucinogenic abuse, opiate abuse, stimulant abuse who is an established patient with [facility] for medication management. He had seen Freddrick Jaffe, PA-C to establish care and was prescribed sertraline  uptitrated to 150 mg, trazodone  50 mg at bedtime, olanzapine  5 mg, buspirone  7.5 mg. He has had history of multiple psychiatric hospitalization due to substance abuse and once for suicide attempt. He had been released from jail early March 2025 after violating parole. He was seen by [facility] as recently as 04/2023.   Today, 10/08/2023, ***  Patient needs updated labs  Plan:  # *** Past medication trials:  Status of problem: *** Interventions: -- ***  # *** Past medication trials:  Status of problem: *** Interventions: -- ***  # *** Past medication trials:  Status of problem: *** Interventions: -- ***  Return to care in ***  Patient was given contact information for behavioral health clinic and was instructed to call 911 for emergencies.    Patient and plan of care will be discussed with the Attending MD, Dr. ***, who agrees with the above statement and plan.   Subjective:  Chief Complaint: Medication Management   Interval History: ***  Visit Diagnosis: No diagnosis found.  Past Psychiatric History:  Diagnoses: *** Medication trials: *** Previous psychiatrist/therapist: *** Hospitalizations: ~5 times, most recent being 12/2016 for amitriptyline overdose, 4 prior to that due to violent behavior while intoxicated Suicide attempts: *** SIB: *** Hx of violence towards others: *** Current access to guns: *** Hx of trauma/abuse: *** Substance use: ***  Past Medical History:  Past  Medical History:  Diagnosis Date   Anxiety    Bipolar 1 disorder (HCC)    Depression    Headache    Heart murmur    Hypertension     Past Surgical History:  Procedure Laterality Date   CLOSED REDUCTION MANDIBLE Bilateral 11/15/2021   Procedure: CLOSED REDUCTION BILATERAL MANDIBLE;  Surgeon: Joseph Nickel, DMD;  Location: [facility] OR;  Service: Oral Surgery;  Laterality: Bilateral;   KNEE SURGERY Right     Family Psychiatric History: ***  Family History:  No family history on file.  Social History:  Academic/Vocational: *** Social History   Socioeconomic History   Marital status: Single    Spouse name: Not on file   Number of children: Not on file   Years of education: Not on file   Highest education level: Not on file  Occupational History   Not on file  Tobacco Use   Smoking status: Every Day    Current packs/day: 0.50    Average packs/day: 0.5 packs/day for 4.0 years (2.0 ttl pk-yrs)    Types: Cigarettes   Smokeless tobacco: Never  Vaping Use   Vaping status: Never Used  Substance and Sexual Activity   Alcohol use: Yes    Comment: last time 11/11/21   Drug use: Yes    Types: Marijuana, Benzodiazepines, MDMA (Ecstacy), Cocaine    Comment: Last time Cocaine, Marijuna- 11/11/21   Sexual activity: Not Currently  Other Topics Concern   Not on file  Social History Narrative   Lives with mother and father who are in their 25s   Likes to exercise   Social Drivers of Corporate investment banker  Strain: Not on file  Food Insecurity: Not on file  Transportation Needs: Not on file  Physical Activity: Not on file  Stress: Not on file  Social Connections: Not on file    Allergies:  No Known Allergies  Current Medications: Current Outpatient Medications  Medication Sig Dispense Refill   busPIRone  (BUSPAR ) 7.5 MG tablet Take 1 tablet (7.5 mg total) by mouth 2 (two) times daily. 60 tablet 1   gabapentin  (NEURONTIN ) 100 MG capsule Take 1 capsule (100 mg total) by mouth 2  (two) times daily. 60 capsule 0   hydrOXYzine  (ATARAX ) 25 MG tablet Take 1 tablet (25 mg total) by mouth 3 (three) times daily as needed. 75 tablet 1   OLANZapine  (ZYPREXA ) 5 MG tablet Take 1 tablet (5 mg total) by mouth at bedtime. 30 tablet 1   sertraline  (ZOLOFT ) 100 MG tablet Take 1.5 tablets (150 mg total) by mouth daily. 45 tablet 1   sertraline  (ZOLOFT ) 50 MG tablet Take 1 tablet (50 mg total) by mouth daily for 6 days, THEN 2 tablets (100 mg total) daily for 6 days, THEN 3 tablets (150 mg total) daily for 18 days. 72 tablet 0   traZODone  (DESYREL ) 50 MG tablet Take 1 tablet (50 mg total) by mouth at bedtime. 30 tablet 1   No current facility-administered medications for this visit.    ROS: Review of Systems ***  Objective:  Psychiatric Specialty Exam: There were no vitals taken for this visit.There is no height or weight on file to calculate BMI.  General Appearance: {Appearance:22683}  Eye Contact:  {BHH EYE CONTACT:22684}  Speech:  {Speech:22685}  Volume:  {Volume (PAA):22686}  Mood:  {BHH MOOD:22306}  Affect:  {Affect (PAA):22687}  Thought Content: {Thought Content:22690}   Suicidal Thoughts:  {ST/HT (PAA):22692}  Homicidal Thoughts:  {ST/HT (PAA):22692}  Thought Process:  {Thought Process (PAA):22688}  Orientation:  {BHH ORIENTATION (PAA):22689}  Judgment:  {Judgement (PAA):22694}  Insight:  {Insight (PAA):22695}  Concentration:  {Concentration:21399}  Fund of Knowledge: {BHH GOOD/FAIR/POOR:22877}  Language: {BHH GOOD/FAIR/POOR:22877}  Psychomotor Activity:  {Psychomotor (PAA):22696}  Akathisia:  {BHH YES OR NO:22294}  AIMS (if indicated): {Desc; done/not:10129}  Assets:  {Assets (PAA):22698}  ADL's:  {BHH VWU'J:81191}  Cognition: {chl bhh cognition:304700322}    PE: General: well-appearing; no acute distress *** Pulm: no increased work of breathing on room air *** Strength & Muscle Tone: {desc; muscle tone:32375} Neuro: no focal neurological deficits observed  *** Gait & Station: {PE GAIT ED YNWG:95621}    Screenings: AIMS    Flowsheet Row [facility] Ad[facility] (Dis[facility] Ad[facility] (Discharged) [facility] Ad[facility] (Dis[facility]) [facility]  AIMS Total Score 0 0 0 0      AUDIT    Flowsheet Row Ad[facility]) [facility] Ad[facility] (Discharged) [facility] Admission (Discharged) [facility] Ad[facility] (Discharged[facility]  Alcohol Use Disorder Identification Test Final Score (AUDIT) 25 37 29 20      GAD-7    Flowsheet Row [facility] Visit from 09/03/2023 in [facility]  Total GAD-7 Score 15      PHQ2-9    Flowsheet Row Office Visit from 09/03/2023 in [facility]  Center Office Visit from 05/01/2022 in [facility] [place]. [facility] Office Visit from 11/17/2019 in [facility] [place]. [facility]  PHQ-2 Total Score 5 3 3   PHQ-9 Total Score 18 10 --      Flowsheet Row Office Visit from 09/03/2023 in [facility] ED from 08/15/2023 in [facility] ED from 12/26/2021 in [facility] Emergency Department at [facility]  C-SSRS RISK CATEGORY High Risk No Risk High Risk       Augusta Blizzard, MD 10/08/2023, 2:42 PM

## 2023-10-09 ENCOUNTER — Encounter (HOSPITAL_COMMUNITY): Payer: MEDICAID | Admitting: Student

## 2023-10-16 ENCOUNTER — Encounter (HOSPITAL_COMMUNITY): Payer: MEDICAID | Admitting: Student

## 2023-10-25 ENCOUNTER — Other Ambulatory Visit: Payer: Self-pay

## 2023-11-17 ENCOUNTER — Other Ambulatory Visit (HOSPITAL_COMMUNITY): Payer: Self-pay | Admitting: Physician Assistant

## 2023-11-17 DIAGNOSIS — F411 Generalized anxiety disorder: Secondary | ICD-10-CM

## 2023-11-20 ENCOUNTER — Other Ambulatory Visit: Payer: Self-pay

## 2023-11-20 MED ORDER — OLANZAPINE 10 MG PO TABS
10.0000 mg | ORAL_TABLET | Freq: Every evening | ORAL | 0 refills | Status: DC
  Filled 2023-11-20: qty 30, 30d supply, fill #0

## 2023-11-20 MED ORDER — SERTRALINE HCL 100 MG PO TABS
100.0000 mg | ORAL_TABLET | Freq: Every day | ORAL | 0 refills | Status: DC
  Filled 2023-11-20: qty 30, 30d supply, fill #0

## 2023-11-20 MED ORDER — BUSPIRONE HCL 15 MG PO TABS
15.0000 mg | ORAL_TABLET | Freq: Three times a day (TID) | ORAL | 0 refills | Status: AC
  Filled 2023-11-20: qty 90, 30d supply, fill #0

## 2023-11-20 MED ORDER — NALTREXONE HCL 50 MG PO TABS
50.0000 mg | ORAL_TABLET | Freq: Every evening | ORAL | 0 refills | Status: DC
  Filled 2023-11-20: qty 30, 30d supply, fill #0

## 2023-11-20 MED ORDER — HYDROXYZINE PAMOATE 50 MG PO CAPS
50.0000 mg | ORAL_CAPSULE | Freq: Two times a day (BID) | ORAL | 0 refills | Status: DC | PRN
  Filled 2023-11-20: qty 60, 30d supply, fill #0

## 2023-11-22 ENCOUNTER — Other Ambulatory Visit: Payer: Self-pay
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
## 2024-06-18 ENCOUNTER — Other Ambulatory Visit: Payer: Self-pay

## 2024-06-20 NOTE — Progress Notes (Signed)
 [name] BAL is 385, patient will be assessed once clinically appropriate.

## 2024-06-21 NOTE — ED Notes (Signed)
 Discharge instruction for home care ,including activity restrictions,and follow up appointments,were reviewed with the patient.Medications for home were reviewed with the patient,including dosages,administration times,potential side effects and any necessary precautions.Patient belongings returned,booked a ride going back to the shelter.

## 2024-06-26 ENCOUNTER — Other Ambulatory Visit: Payer: Self-pay

## 2024-06-26 ENCOUNTER — Ambulatory Visit (HOSPITAL_COMMUNITY)
Admission: EM | Admit: 2024-06-26 | Discharge: 2024-06-26 | Disposition: A | Discharge status: Home / Self Care | Payer: MEDICAID | Attending: Psychiatry | Admitting: Psychiatry

## 2024-06-26 DIAGNOSIS — F12980 Cannabis use, unspecified with anxiety disorder: Secondary | ICD-10-CM

## 2024-06-26 DIAGNOSIS — F1020 Alcohol dependence, uncomplicated: Secondary | ICD-10-CM | POA: Insufficient documentation

## 2024-06-26 DIAGNOSIS — F411 Generalized anxiety disorder: Secondary | ICD-10-CM | POA: Insufficient documentation

## 2024-06-26 DIAGNOSIS — F422 Mixed obsessional thoughts and acts: Secondary | ICD-10-CM

## 2024-06-26 DIAGNOSIS — F4310 Post-traumatic stress disorder, unspecified: Secondary | ICD-10-CM

## 2024-06-26 DIAGNOSIS — F3160 Bipolar disorder, current episode mixed, unspecified: Secondary | ICD-10-CM | POA: Insufficient documentation

## 2024-06-26 DIAGNOSIS — R059 Cough, unspecified: Secondary | ICD-10-CM | POA: Insufficient documentation

## 2024-06-26 DIAGNOSIS — R5383 Other fatigue: Secondary | ICD-10-CM | POA: Insufficient documentation

## 2024-06-26 MED ORDER — BUSPIRONE HCL 15 MG PO TABS: 15.0000 mg | ORAL_TABLET | Freq: Three times a day (TID) | ORAL | 1 refills | Status: DC

## 2024-06-26 MED ORDER — HYDROXYZINE HCL 25 MG PO TABS: 25.0000 mg | ORAL_TABLET | Freq: Three times a day (TID) | ORAL | 1 refills | Status: AC | PRN

## 2024-06-26 MED ORDER — OLANZAPINE 10 MG PO TABS: 10.0000 mg | ORAL_TABLET | Freq: Every day | ORAL | 1 refills | Status: DC

## 2024-06-26 MED ORDER — SERTRALINE HCL 50 MG PO TABS: ORAL_TABLET | ORAL | 0 refills | Status: DC
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
## 2024-06-26 NOTE — Discharge Instructions (Addendum)
 " [facility], Address:  [address] [place], [place]  [address] Phone: [phone]    Patient has an intake appointment on Monday June 29, 2024 at 10am in person      Supporting your loved ones as they deal with their condition  Do you have a loved one who is dealing with a behavioral health issue, or do you know someone who displays symptoms that may be related to one? Our Behavioral health services may provide them with the support they need to understand their symptoms and conditions and find treatments that promote the management of their conditions.  At [facility] [place], our behavioral health services begin with an evaluation. This process will help our professionals better understand the nature of our clients conditions. It also enables our professionals to create a personalized treatment plan that will serve as their guide during sessions.  In our commitment to support your mental wellness journey, we offer a comprehensive array of services and programs to address your needs:  Dual Diagnosis Treatment: Mental health professionals specialize in treating co-occurring mental health and substance use disorders, offering integrated interventions to address both conditions simultaneously and promote holistic recovery.  Substance Abuse Counseling: Therapists deliver specialized counseling and support services tailored to individuals struggling with substance abuse, focusing on motivation for change, relapse prevention, coping skills, and lifestyle modifications.  Peer Support: Substance abuse treatment programs incorporate evidence-based interventions such as Alcoholics Anonymous (AA), Narcotics Anonymous (NA), and other peer support groups to provide ongoing encouragement, accountability, and community for individuals in recovery.  Relapse Prevention Planning: Therapists collaborate with clients to develop personalized relapse  prevention plans, identifying triggers, coping strategies, and support networks to mitigate the risk of relapse and maintain long-term sobriety.  Family Therapy and Support: Substance abuse treatment programs involve family members in therapy sessions and support groups to address the impact of addiction on relationships, improve communication, and strengthen family dynamics to support recovery.  Continuing Care and Aftercare Services: Substance abuse treatment providers offer ongoing support, monitoring, and follow-up care after completion of formal treatment programs, including outpatient counseling, sober living arrangements, and community-based support services.   Services Offered: Supportive Employment Advertising Account Planner Peer Support Services Case Management Smoking Cessation Psychosocial Rehabilitation Outpatient Therapy Group & Individual Substance Abuse Intensive Outpatient Program (SAIOP) Substance Abuse Comprehensive Outpatient Treatment (SACOT) Home Health Services We take a holistic approach to our services. Whether a client requires one or more of our services, our health professionals and care coordinators are ready to help. Read more about the services we offer by clicking below.   SAIOP stands for Substance Abuse Intensive Outpatient Program. SACOT stands for Substance Abuse Comprehensive Outpatient Treatment.  Assessment and Evaluation: Conducting thorough assessments to evaluate the severity of substance use disorders, assess co-occurring mental health issues, and determine the most appropriate level of care and treatment plan.  Individualized Treatment Planning: Developing personalized treatment plans tailored to the unique needs, preferences, and goals of each client, incorporating evidence-based interventions and strategies for recovery. Group Counseling and Therapy: Offering group therapy sessions where individuals can share  experiences, receive support, and learn coping skills from peers facing similar challenges in a structured and supportive environment. Psychoeducation: Providing education about substance abuse, addiction, recovery, and coping strategies to help individuals and their families understand the nature of addiction and develop skills for managing triggers and cravings.  Cognitive-Behavioral Therapy (CBT): Implementing CBT techniques to help individuals identify and challenge negative thought  patterns, develop coping skills, and modify behaviors associated with substance use. Family Involvement and Support: Involving family members in therapy sessions and support groups to address family dynamics, improve communication, and provide a supportive environment for recovery efforts. Medication-Assisted Treatment (MAT): Offering medications such as methadone, buprenorphine, or naltrexone , in combination with counseling and behavioral therapies, to support recovery from opioid or alcohol dependence and reduce cravings and withdrawal symptoms.  Relapse Prevention Planning: Collaborating with clients to develop personalized relapse prevention plans, identifying triggers, creating coping strategies, and establishing support networks to maintain sobriety and prevent relapse.  Case Management Services: Providing case management support to help individuals navigate treatment, access support services, and connect with community resources for housing, employment, healthcare, and legal assistance.  Continuing Care and Aftercare: Offering ongoing support and follow-up care after completion of the SACOT program, including outpatient counseling, relapse prevention support, and referrals to community resources for ongoing support and maintenance of recovery.  Dual Diagnosis Treatment: Specializing in treating individuals with co-occurring substance use and mental health disorders, providing integrated interventions to address  both conditions simultaneously and promote holistic recovery.  Peer Support and 12-Step Programs: Facilitating participation in peer support groups such as Alcoholics Anonymous (AA) or Narcotics Anonymous (NA) to provide ongoing encouragement, accountability, and community support for recovery.  Firefighter and Skills Building: Assisting individuals in rebuilding their lives and reintegrating into their communities by providing support with employment, education, vocational training, and life skills development.  "

## 2024-06-26 NOTE — Progress Notes (Signed)
" °   06/26/24 0846  [facility] Triage Screening (Walk-ins at [facility] only)  What Is the Reason for Your Visit/Call Today? Adam Novak is a 34 year old male presenting to [facility] unaccompanied. Pt appears to be intoxicated upon triage. States he had 8-10 beers last night along with 4 loco shots. Pt was unable to state if he had any more to drink today. Pt also states that he kinda wants to hurt himself because his family does not accept him. Pt was difficult to assess, due to the condition he is in. Pt was unable to complete full triage.  How Long Has This Been Causing You Problems? <Week  Have You Recently Had Any Thoughts About Hurting Yourself? Yes  How long ago did you have thoughts about hurting yourself? today  Are You Planning to Commit Suicide/Harm Yourself At This time? No  Have you Recently Had Thoughts About Hurting Someone [name]? No  Have You Used Any Alcohol or Drugs in the Past 24 Hours? Yes  What Did You Use and How Much? 6-8 beers, 4 locos  What Do You Feel Would Help You the Most Today? Alcohol or Drug Use Treatment  If access to [facility] was not available, would you have sought care in the Emergency Department? No  Determination of Need Emergent (2 hours)  Options For Referral Facility-Based Crisis  Determination of Need filed? Yes    "

## 2024-06-26 NOTE — Discharge Summary (Signed)
 Adam Novak to be discharged Home per NP order. An After Visit Summary was printed and given to the patient. Patient escorted out, and discharged home via private auto.  [name]  06/26/2024 11:35 AM

## 2024-06-26 NOTE — ED Notes (Deleted)
 Patient Is discharging at this time by provider. Printed AVS reviewed with patient by provider. Valuables/belongings returned to patient.

## 2024-06-30 ENCOUNTER — Ambulatory Visit (HOSPITAL_COMMUNITY)
Admission: EM | Admit: 2024-06-30 | Discharge: 2024-06-30 | Disposition: A | Discharge status: Home / Self Care | Payer: MEDICAID | Attending: Nurse Practitioner | Admitting: Nurse Practitioner

## 2024-06-30 DIAGNOSIS — F1029 Alcohol dependence with unspecified alcohol-induced disorder: Secondary | ICD-10-CM | POA: Insufficient documentation

## 2024-06-30 DIAGNOSIS — F10229 Alcohol dependence with intoxication, unspecified: Secondary | ICD-10-CM | POA: Insufficient documentation

## 2024-06-30 DIAGNOSIS — Z634 Disappearance and death of family member: Secondary | ICD-10-CM | POA: Insufficient documentation

## 2024-06-30 DIAGNOSIS — Y909 Presence of alcohol in blood, level not specified: Secondary | ICD-10-CM | POA: Insufficient documentation

## 2024-06-30 DIAGNOSIS — Z62898 Other specified problems related to upbringing: Secondary | ICD-10-CM | POA: Insufficient documentation

## 2024-06-30 DIAGNOSIS — Z608 Other problems related to social environment: Secondary | ICD-10-CM | POA: Insufficient documentation
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
## 2024-06-30 NOTE — Discharge Summary (Signed)
 Adam Novak to be discharged Home per MD order. An After Visit Summary was printed and given to the patient. Patient escorted out and discharged home via private auto.  Adam Novak  06/30/2024 1:43 PM

## 2024-06-30 NOTE — Discharge Instructions (Signed)
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
## 2024-07-04 DIAGNOSIS — S36498A Other injury of other part of small intestine, initial encounter: Secondary | ICD-10-CM | POA: Diagnosis present

## 2024-07-04 DIAGNOSIS — R21 Rash and other nonspecific skin eruption: Secondary | ICD-10-CM | POA: Diagnosis not present

## 2024-07-04 DIAGNOSIS — F430 Acute stress reaction: Secondary | ICD-10-CM | POA: Diagnosis present

## 2024-07-04 DIAGNOSIS — Z23 Encounter for immunization: Secondary | ICD-10-CM

## 2024-07-04 DIAGNOSIS — K658 Other peritonitis: Secondary | ICD-10-CM | POA: Diagnosis present

## 2024-07-04 DIAGNOSIS — F419 Anxiety disorder, unspecified: Secondary | ICD-10-CM | POA: Diagnosis present

## 2024-07-04 DIAGNOSIS — S31139A Puncture wound of abdominal wall without foreign body, unspecified quadrant without penetration into peritoneal cavity, initial encounter: Principal | ICD-10-CM

## 2024-07-04 DIAGNOSIS — Y249XXA Unspecified firearm discharge, undetermined intent, initial encounter: Secondary | ICD-10-CM | POA: Diagnosis present

## 2024-07-04 DIAGNOSIS — K567 Ileus, unspecified: Secondary | ICD-10-CM | POA: Diagnosis not present

## 2024-07-04 DIAGNOSIS — F1290 Cannabis use, unspecified, uncomplicated: Secondary | ICD-10-CM | POA: Diagnosis present

## 2024-07-04 DIAGNOSIS — F319 Bipolar disorder, unspecified: Secondary | ICD-10-CM | POA: Diagnosis present

## 2024-07-04 DIAGNOSIS — F4310 Post-traumatic stress disorder, unspecified: Secondary | ICD-10-CM | POA: Diagnosis present

## 2024-07-04 DIAGNOSIS — I959 Hypotension, unspecified: Secondary | ICD-10-CM | POA: Diagnosis present

## 2024-07-04 DIAGNOSIS — K661 Hemoperitoneum: Secondary | ICD-10-CM | POA: Diagnosis present

## 2024-07-04 DIAGNOSIS — F603 Borderline personality disorder: Secondary | ICD-10-CM | POA: Diagnosis present

## 2024-07-04 DIAGNOSIS — Z5900 Homelessness unspecified: Secondary | ICD-10-CM

## 2024-07-04 DIAGNOSIS — S36031A Moderate laceration of spleen, initial encounter: Secondary | ICD-10-CM | POA: Diagnosis present

## 2024-07-04 DIAGNOSIS — K439 Ventral hernia without obstruction or gangrene: Secondary | ICD-10-CM | POA: Diagnosis present

## 2024-07-04 DIAGNOSIS — Z9151 Personal history of suicidal behavior: Secondary | ICD-10-CM

## 2024-07-04 DIAGNOSIS — S31131A Puncture wound of abdominal wall without foreign body, left upper quadrant without penetration into peritoneal cavity, initial encounter: Principal | ICD-10-CM | POA: Diagnosis present

## 2024-07-04 DIAGNOSIS — Z9889 Other specified postprocedural states: Secondary | ICD-10-CM

## 2024-07-04 DIAGNOSIS — R000 Tachycardia, unspecified: Secondary | ICD-10-CM | POA: Diagnosis not present

## 2024-07-04 DIAGNOSIS — E669 Obesity, unspecified: Secondary | ICD-10-CM | POA: Diagnosis present

## 2024-07-04 DIAGNOSIS — F1010 Alcohol abuse, uncomplicated: Secondary | ICD-10-CM | POA: Diagnosis present

## 2024-07-04 DIAGNOSIS — S36592A Other injury of descending [left] colon, initial encounter: Secondary | ICD-10-CM | POA: Diagnosis present

## 2024-07-05 ENCOUNTER — Emergency Department (HOSPITAL_COMMUNITY): Payer: MEDICAID | Admitting: Anesthesiology

## 2024-07-05 ENCOUNTER — Emergency Department (HOSPITAL_COMMUNITY): Payer: MEDICAID

## 2024-07-05 ENCOUNTER — Inpatient Hospital Stay (HOSPITAL_COMMUNITY): Payer: MEDICAID

## 2024-07-05 ENCOUNTER — Encounter (HOSPITAL_COMMUNITY): Payer: Self-pay | Admitting: Emergency Medicine

## 2024-07-05 ENCOUNTER — Other Ambulatory Visit: Payer: Self-pay

## 2024-07-05 DIAGNOSIS — Z9889 Other specified postprocedural states: Secondary | ICD-10-CM

## 2024-07-05 DIAGNOSIS — Y249XXA Unspecified firearm discharge, undetermined intent, initial encounter: Secondary | ICD-10-CM

## 2024-07-05 DIAGNOSIS — S36501A Unspecified injury of transverse colon, initial encounter: Secondary | ICD-10-CM | POA: Diagnosis not present

## 2024-07-05 DIAGNOSIS — S3600XA Unspecified injury of spleen, initial encounter: Secondary | ICD-10-CM | POA: Diagnosis not present

## 2024-07-05 LAB — URINALYSIS, ROUTINE W REFLEX MICROSCOPIC
Bilirubin Urine: NEGATIVE
Glucose, UA: NEGATIVE mg/dL
Hgb urine dipstick: NEGATIVE
Ketones, ur: NEGATIVE mg/dL
Leukocytes,Ua: NEGATIVE
Nitrite: NEGATIVE
Protein, ur: NEGATIVE mg/dL
Specific Gravity, Urine: 1.02 (ref 1.005–1.030)
pH: 6 (ref 5.0–8.0)

## 2024-07-05 LAB — COMPREHENSIVE METABOLIC PANEL WITH GFR
ALT: 13 U/L (ref 0–44)
AST: 33 U/L (ref 15–41)
Albumin: 3.6 g/dL (ref 3.5–5.0)
Alkaline Phosphatase: 49 U/L (ref 38–126)
Anion gap: 13 (ref 5–15)
BUN: 8 mg/dL (ref 6–20)
CO2: 21 mmol/L — ABNORMAL LOW (ref 22–32)
Calcium: 8 mg/dL — ABNORMAL LOW (ref 8.9–10.3)
Chloride: 108 mmol/L (ref 98–111)
Creatinine, Ser: 0.84 mg/dL (ref 0.61–1.24)
GFR, Estimated: >60 mL/min
Glucose, Bld: 129 mg/dL — ABNORMAL HIGH (ref 70–99)
Potassium: 3.9 mmol/L (ref 3.5–5.1)
Sodium: 142 mmol/L (ref 135–145)
Total Bilirubin: 0.4 mg/dL (ref 0.0–1.2)
Total Protein: 5.3 g/dL — ABNORMAL LOW (ref 6.5–8.1)

## 2024-07-05 LAB — ETHANOL: Alcohol, Ethyl (B): 173 mg/dL — ABNORMAL HIGH

## 2024-07-05 LAB — TYPE AND SCREEN
ABO/RH(D): O POS
Antibody Screen: NEGATIVE
Unit division: 0

## 2024-07-05 LAB — BPAM RBC
Blood Product Expiration Date: 202602112359
ISSUE DATE / TIME: 202601242356
Unit Type and Rh: 5100

## 2024-07-05 LAB — ABO/RH: ABO/RH(D): O POS

## 2024-07-05 LAB — URINE DRUG SCREEN
Amphetamines: NEGATIVE
Barbiturates: NEGATIVE
Benzodiazepines: NEGATIVE
Cocaine: NEGATIVE
Fentanyl: NEGATIVE
Methadone Scn, Ur: NEGATIVE
Opiates: NEGATIVE
Tetrahydrocannabinol: NEGATIVE

## 2024-07-05 LAB — PREPARE RBC (CROSSMATCH)

## 2024-07-05 LAB — CBC
HCT: 39 % (ref 39.0–52.0)
Hemoglobin: 13.3 g/dL (ref 13.0–17.0)
MCH: 32.4 pg (ref 26.0–34.0)
MCHC: 34.1 g/dL (ref 30.0–36.0)
MCV: 94.9 fL (ref 80.0–100.0)
Platelets: 254 10*3/uL (ref 150–400)
RBC: 4.11 MIL/uL — ABNORMAL LOW (ref 4.22–5.81)
RDW: 14.4 % (ref 11.5–15.5)
WBC: 8.3 10*3/uL (ref 4.0–10.5)
nRBC: 0 % (ref 0.0–0.2)

## 2024-07-05 LAB — HIV ANTIBODY (ROUTINE TESTING W REFLEX): HIV Screen 4th Generation wRfx: NONREACTIVE

## 2024-07-05 LAB — PROTIME-INR
INR: 1.1 (ref 0.8–1.2)
Prothrombin Time: 14.5 s (ref 11.4–15.2)

## 2024-07-05 MED ADMIN — Ketorolac Tromethamine Inj 15 MG/ML: 30 mg | INTRAVENOUS | NDC 72266023401

## 2024-07-05 MED ADMIN — Oxycodone HCl Tab 5 MG: 10 mg | ORAL | NDC 10702001850

## 2024-07-05 MED ADMIN — Oxycodone HCl Tab 5 MG: 10 mg | ORAL | NDC 00406055223

## 2024-07-05 MED ADMIN — Methocarbamol Tab 500 MG: 500 mg | ORAL | NDC 70010075405

## 2024-07-05 MED ADMIN — Sennosides Tab 8.6 MG: 8.6 mg | ORAL | NDC 00904725261

## 2024-07-05 MED ADMIN — Sennosides Tab 8.6 MG: 8.6 mg | ORAL | NDC 00904725280

## 2024-07-05 MED ADMIN — Tet Tox-Diph-Acell Pertuss Ad Inj 5-2-15.5 LF-LF-MCG/0.5ML: 0.5 mL | INTRAMUSCULAR | NDC 49281040020

## 2024-07-05 MED ADMIN — Morphine Sulfate IV Soln PF 4 MG/ML: 4 mg | INTRAVENOUS | NDC 76045000501

## 2024-07-05 MED ADMIN — Morphine Sulfate IV Soln PF 4 MG/ML: 4 mg | INTRAVENOUS | NDC 00641612501

## 2024-07-05 MED ADMIN — Piperacillin Sod-Tazobactam Sod in Dex IV Sol 3-0.375GM/50ML: 3.375 g | INTRAVENOUS | NDC 00206886101

## 2024-07-05 MED ADMIN — Piperacillin Sod-Tazobactam Sod in Dex IV Sol 3-0.375GM/50ML: 3.375 g | INTRAVENOUS | NDC 00338963601

## 2024-07-05 MED ADMIN — Rocuronium Bromide IV Soln 100 MG/10ML (10 MG/ML): 30 mg | INTRAVENOUS | NDC 99999430723

## 2024-07-05 MED ADMIN — Rocuronium Bromide IV Soln 100 MG/10ML (10 MG/ML): 20 mg | INTRAVENOUS | NDC 99999430723

## 2024-07-05 MED ADMIN — Ondansetron HCl Inj 4 MG/2ML (2 MG/ML): 4 mg | INTRAVENOUS | NDC 60505613005

## 2024-07-05 MED ADMIN — Lactated Ringer's Solution: 600 mL | INTRAVENOUS | NDC 00264775000

## 2024-07-05 MED ADMIN — Sugammadex Sodium IV 200 MG/2ML (Base Equivalent): 200 mg | INTRAVENOUS | NDC 00006542302

## 2024-07-05 MED ADMIN — Methocarbamol Inj 1000 MG/10ML: 500 mg | INTRAVENOUS | NDC 55150022310

## 2024-07-05 MED ADMIN — Fentanyl Citrate Preservative Free (PF) Inj 250 MCG/5ML: 100 ug | INTRAVENOUS | NDC 72572017125

## 2024-07-05 MED ADMIN — Acetaminophen Tab 500 MG: 1000 mg | ORAL | NDC 50580045711

## 2024-07-05 MED ADMIN — PROPOFOL 200 MG/20ML IV EMUL: 30 mg | INTRAVENOUS | NDC 00069020910

## 2024-07-05 MED ADMIN — Iohexol IV Soln 350 MG/ML: 75 mL | INTRAVENOUS | NDC 00407141490

## 2024-07-05 MED ADMIN — Sodium Chloride Irrigation Soln 0.9%: 1000 mL | NDC 99999050048

## 2024-07-05 MED ADMIN — Lidocaine HCl(Cardiac) IV PF Soln Pref Syr 100 MG/5ML (2%): 80 mg | INTRATRACHEAL | NDC 00409132305

## 2024-07-05 MED ADMIN — Acetaminophen IV Soln 10 MG/ML: 1000 mg | INTRAVENOUS | NDC 63323043441

## 2024-07-05 MED ADMIN — Cefazolin Sodium for IV Soln 2 GM and Dextrose 3% (50 ML): 2 g | INTRAVENOUS | NDC 00264310511

## 2024-07-05 MED ADMIN — Dexamethasone Sod Phosphate Preservative Free Inj 10 MG/ML: 10 mg | INTRAVENOUS | NDC 25021005301

## 2024-07-05 MED ADMIN — Phenylephrine HCl IV Soln 10 MG/ML: 160 ug | INTRAVENOUS | NDC 00641614225

## 2024-07-05 MED ADMIN — Succinylcholine Chloride Sol Pref Syr 200 MG/10ML (20 MG/ML): 160 mg | INTRAVENOUS | NDC 99999070035

## 2024-07-05 MED FILL — Morphine Sulfate IV Soln PF 4 MG/ML: 4.0000 mg | INTRAVENOUS | Qty: 1 | Status: AC

## 2024-07-05 MED FILL — Piperacillin Sod-Tazobactam Sod in Dex IV Sol 3-0.375GM/50ML: 3.3750 g | INTRAVENOUS | Qty: 50 | Status: AC

## 2024-07-05 MED FILL — Sennosides Tab 8.6 MG: 1.0000 | ORAL | Qty: 1 | Status: AC

## 2024-07-05 MED FILL — Ketorolac Tromethamine Inj 15 MG/ML: 30.0000 mg | INTRAMUSCULAR | Qty: 2 | Status: AC

## 2024-07-05 MED FILL — Hydromorphone HCl Inj 1 MG/ML: INTRAMUSCULAR | Qty: 1 | Status: AC

## 2024-07-05 MED FILL — Oxycodone HCl Tab 5 MG: 5.0000 mg | ORAL | Qty: 2 | Status: AC

## 2024-07-05 MED FILL — Oxycodone HCl Tab 5 MG: 5.0000 mg | ORAL | Qty: 2 | Status: CN

## 2024-07-05 MED FILL — Methocarbamol Tab 500 MG: 500.0000 mg | ORAL | Qty: 1 | Status: AC

## 2024-07-05 MED FILL — Enoxaparin Sodium Inj Soln Pref Syr 30 MG/0.3ML: 30.0000 mg | INTRAMUSCULAR | Qty: 0.3 | Status: AC

## 2024-07-05 MED FILL — Propofol IV Emul 200 MG/20ML (10 MG/ML): INTRAVENOUS | Qty: 20 | Status: AC

## 2024-07-05 MED FILL — Acetaminophen Tab 500 MG: 1000.0000 mg | ORAL | Qty: 2 | Status: AC

## 2024-07-05 MED FILL — Methocarbamol Inj 1000 MG/10ML: 500.0000 mg | INTRAMUSCULAR | Qty: 10 | Status: AC

## 2024-07-05 MED FILL — Acetaminophen IV Soln 10 MG/ML: INTRAVENOUS | Qty: 100 | Status: AC

## 2024-07-05 MED FILL — Succinylcholine Chloride Sol Pref Syr 200 MG/10ML (20 MG/ML): INTRAVENOUS | Qty: 10 | Status: AC

## 2024-07-05 MED FILL — henylephrine-NaCl Pref Syr 0.8 MG/10ML-0.9% (80 MCG/ML): INTRAVENOUS | Qty: 20 | Status: AC

## 2024-07-05 MED FILL — Fentanyl Citrate Preservative Free (PF) Inj 250 MCG/5ML: INTRAMUSCULAR | Qty: 5 | Status: AC

## 2024-07-05 MED FILL — Lidocaine HCl Local Soln Prefilled Syringe 100 MG/5ML (2%): INTRAMUSCULAR | Qty: 5 | Status: AC

## 2024-07-05 MED FILL — Ondansetron HCl Inj 4 MG/2ML (2 MG/ML): INTRAMUSCULAR | Qty: 2 | Status: AC

## 2024-07-05 MED FILL — Rocuronium Bromide IV Soln Pref Syr 100 MG/10ML (10 MG/ML): INTRAVENOUS | Qty: 10 | Status: AC

## 2024-07-05 MED FILL — Dexamethasone Sod Phosphate Preservative Free Inj 10 MG/ML: INTRAMUSCULAR | Qty: 1 | Status: AC
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
## 2024-07-05 NOTE — Progress Notes (Signed)
 While patient was still in OR, I noticed that he was getting a wound vac placed and was concerned this meant he would have an open belly and that would not be appropriate for med surg level of care. I consulted with my assistant nurse manager Sam, RN, who was working with me. He also voiced concern, so I called into the OR. The OR nurse relayed my message and Dr. Paola and Dr. Leopoldo both said that the fascia would be closed and it would just be the skin that would be left open. Sam, RN called the [facility] to make sure this was still appropriate for 5N. The [facility] said yes the patient was appropriate for 5N. Dr. Leopoldo accompanied the patient from the OR to PACU. He gave a verbal order to remove Aline. He observed the patient for about 30 minutes and said that he felt that the patient was appropriate for med surg and could be taken to the floor whenever they were ready. While in the PACU, the patient's vitals remained stable and though he was drowsy, he was easily roused. Relayed all this during report to [employer], CHARITY FUNDRAISER. She was present during bedside handoff.

## 2024-07-05 NOTE — Anesthesia Procedure Notes (Signed)
 Central Venous Catheter Insertion Performed by: Leopoldo Bruckner, MD, anesthesiologist Start/End1/25/2026 1:13 AM, 07/05/2024 1:23 AM Patient location: OR. Emergency situation Preanesthetic checklist: patient identified, IV checked and monitors and equipment checked Patient sedated Hand hygiene performed  and maximum sterile barriers used  Catheter size: 8 Fr Total catheter length 16. Central line was placed.Double lumen Procedure performed using ultrasound to evaluate access site. Ultrasound Notes:image(s) printed for medical record. Attempts: 1 Following insertion, dressing applied and line sutured. Post procedure assessment: blood return through all ports, free fluid flow and no air  Patient tolerated the procedure well with no immediate complications.

## 2024-07-05 NOTE — Progress Notes (Signed)
 Pt admitted to 5N room 21 via bed from PACU following surgery. Upon arrival to unit patient is able to answer questions appropriately and has no difficulty making staff aware of needs/wants or using call light. Vivek is oriented to staff, room and equipment. Patient declines for staff to contact anyone to make them aware of his arrival to unit. Patient was made aware of telephone in his room that is available to use; pt declines to call himself at this time. Telemetry monitoring not initiated at present d/t unavailability of equipment. CN aware and is working to correct issue.

## 2024-07-05 NOTE — Plan of Care (Signed)
   Problem: Education: Goal: Knowledge of General Education information will improve Description Including pain rating scale, medication(s)/side effects and non-pharmacologic comfort measures Outcome: Progressing
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
## 2024-07-05 NOTE — Anesthesia Procedure Notes (Signed)
 Arterial Line Insertion Start/End1/25/2026 1:00 AM, 07/05/2024 1:10 AM Performed by: Vaughn Zebedee HERO, CRNA, CRNA  Left was placed Catheter size: 20 G Hand hygiene performed  and maximum sterile barriers used   Attempts: 2 Following insertion, dressing applied and Biopatch. Patient tolerated the procedure well with no immediate complications.

## 2024-07-05 NOTE — Transfer of Care (Signed)
 Immediate Anesthesia Transfer of Care Note  Patient: Adam Novak  Procedure(s) Performed: LAPAROTOMY, EXPLORATORY; RIGHT COLON RESECTION; SMALL BOWEL RESECTION (Abdomen) SPLENECTOMY (Abdomen)  Patient Location: PACU  Anesthesia Type:General  Level of Consciousness: awake and sedated  Airway & Oxygen Therapy: Patient connected to face mask oxygen  Post-op Assessment: Report given to RN and Post -op Vital signs reviewed and stable  Post vital signs: Reviewed and stable  Last Vitals:  Vitals Value Taken Time  BP 144/118 07/05/24 02:51  Temp    Pulse 108 07/05/24 03:00  Resp 25 07/05/24 03:00  SpO2 96 % 07/05/24 03:00  Vitals shown include unfiled device data.  Last Pain: There were no vitals filed for this visit.       Complications: No notable events documented.
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
## 2024-07-05 NOTE — ED Notes (Signed)
 Patient transported to CT with RN

## 2024-07-05 NOTE — Anesthesia Procedure Notes (Addendum)
 Procedure Name: Intubation Date/Time: 07/05/2024 12:32 AM  Performed by: Vaughn Zebedee HERO, CRNAPre-anesthesia Checklist: Patient identified, Emergency Drugs available, Suction available and Patient being monitored Patient Re-evaluated:Patient Re-evaluated prior to induction Oxygen Delivery Method: Circle system utilized Preoxygenation: Pre-oxygenation with 100% oxygen Induction Type: IV induction, Rapid sequence and Cricoid Pressure applied Laryngoscope Size: Miller and 2 Grade View: Grade I Tube type: Oral Tube size: 7.5 mm Number of attempts: 1 Airway Equipment and Method: Stylet Placement Confirmation: ETT inserted through vocal cords under direct vision, positive ETCO2 and breath sounds checked- equal and bilateral Secured at: 22 cm Tube secured with: Tape Dental Injury: Teeth and Oropharynx as per pre-operative assessment
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
## 2024-07-05 NOTE — ED Notes (Addendum)
 Patient transported from CT to OR with RN and Trauma MD.
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
## 2024-07-05 NOTE — Anesthesia Postprocedure Evaluation (Signed)
"   Anesthesia Post Note  Patient: Adam Novak  Procedure(s) Performed: LAPAROTOMY, EXPLORATORY; RIGHT COLON RESECTION; SMALL BOWEL RESECTION (Abdomen) SPLENECTOMY (Abdomen)     Patient location during evaluation: PACU Anesthesia Type: General Level of consciousness: awake and awake and alert Pain management: pain level controlled Vital Signs Assessment: post-procedure vital signs reviewed and stable Respiratory status: spontaneous breathing, nonlabored ventilation, respiratory function stable and patient connected to nasal cannula oxygen Cardiovascular status: blood pressure returned to baseline and stable Postop Assessment: no apparent nausea or vomiting Anesthetic complications: no   No notable events documented.  Last Vitals:  Vitals:   07/05/24 0310 07/05/24 0325  BP: (!) 155/81 133/79  Pulse: (!) 116 (!) 112  Resp: 19 16  Temp:    SpO2: 98% 97%    Last Pain: There were no vitals filed for this visit.               Veroncia Jezek      "

## 2024-07-06 LAB — POCT I-STAT 7, (LYTES, BLD GAS, ICA,H+H)
Acid-base deficit: 6 mmol/L — ABNORMAL HIGH (ref 0.0–2.0)
Bicarbonate: 20.1 mmol/L (ref 20.0–28.0)
Calcium, Ion: 1.01 mmol/L — ABNORMAL LOW (ref 1.15–1.40)
HCT: 37 % — ABNORMAL LOW (ref 39.0–52.0)
Hemoglobin: 12.6 g/dL — ABNORMAL LOW (ref 13.0–17.0)
O2 Saturation: 99 %
Patient temperature: 35.8
Potassium: 3.9 mmol/L (ref 3.5–5.1)
Sodium: 142 mmol/L (ref 135–145)
TCO2: 21 mmol/L — ABNORMAL LOW (ref 22–32)
pCO2 arterial: 38.6 mmHg (ref 32–48)
pH, Arterial: 7.32 — ABNORMAL LOW (ref 7.35–7.45)
pO2, Arterial: 122 mmHg — ABNORMAL HIGH (ref 83–108)

## 2024-07-06 LAB — CBC
HCT: 34.4 % — ABNORMAL LOW (ref 39.0–52.0)
Hemoglobin: 11.6 g/dL — ABNORMAL LOW (ref 13.0–17.0)
MCH: 32.3 pg (ref 26.0–34.0)
MCHC: 33.7 g/dL (ref 30.0–36.0)
MCV: 95.8 fL (ref 80.0–100.0)
Platelets: 215 10*3/uL (ref 150–400)
RBC: 3.59 MIL/uL — ABNORMAL LOW (ref 4.22–5.81)
RDW: 14.5 % (ref 11.5–15.5)
WBC: 17.4 10*3/uL — ABNORMAL HIGH (ref 4.0–10.5)
nRBC: 0 % (ref 0.0–0.2)

## 2024-07-06 LAB — BASIC METABOLIC PANEL WITH GFR
Anion gap: 8 (ref 5–15)
BUN: 15 mg/dL (ref 6–20)
CO2: 28 mmol/L (ref 22–32)
Calcium: 7.9 mg/dL — ABNORMAL LOW (ref 8.9–10.3)
Chloride: 102 mmol/L (ref 98–111)
Creatinine, Ser: 0.91 mg/dL (ref 0.61–1.24)
GFR, Estimated: >60 mL/min
Glucose, Bld: 110 mg/dL — ABNORMAL HIGH (ref 70–99)
Potassium: 3.9 mmol/L (ref 3.5–5.1)
Sodium: 139 mmol/L (ref 135–145)

## 2024-07-06 MED ADMIN — Ketorolac Tromethamine Inj 15 MG/ML: 30 mg | INTRAVENOUS | NDC 72266023401

## 2024-07-06 MED ADMIN — Oxycodone HCl Tab 5 MG: 10 mg | ORAL | NDC 00406055223

## 2024-07-06 MED ADMIN — Methocarbamol Tab 500 MG: 500 mg | ORAL | NDC 70010075405

## 2024-07-06 MED ADMIN — Thiamine Mononitrate Tab 100 MG: 100 mg | ORAL | NDC 77333093425

## 2024-07-06 MED ADMIN — Sennosides Tab 8.6 MG: 8.6 mg | ORAL | NDC 00904725261

## 2024-07-06 MED ADMIN — Sennosides Tab 8.6 MG: 8.6 mg | ORAL | NDC 00904725280

## 2024-07-06 MED ADMIN — Morphine Sulfate IV Soln PF 4 MG/ML: 4 mg | INTRAVENOUS | NDC 00409189103

## 2024-07-06 MED ADMIN — Morphine Sulfate IV Soln PF 4 MG/ML: 4 mg | INTRAVENOUS | NDC 00641612501

## 2024-07-06 MED ADMIN — Piperacillin Sod-Tazobactam Sod in Dex IV Sol 3-0.375GM/50ML: 3.375 g | INTRAVENOUS | NDC 00206886101

## 2024-07-06 MED ADMIN — Piperacillin Sod-Tazobactam Sod in Dex IV Sol 3-0.375GM/50ML: 3.375 g | INTRAVENOUS | NDC 00338963601

## 2024-07-06 MED ADMIN — Folic Acid Tab 1 MG: 1 mg | ORAL | NDC 70752023111

## 2024-07-06 MED ADMIN — THERA M PLUS PO TABS: 1 | ORAL | NDC 1650058694

## 2024-07-06 MED ADMIN — Acetaminophen IV Soln 10 MG/ML: 1000 mg | INTRAVENOUS | NDC 63323043441

## 2024-07-06 MED ADMIN — Methocarbamol Inj 1000 MG/10ML: 500 mg | INTRAVENOUS | NDC 55150022310

## 2024-07-06 MED ADMIN — Acetaminophen Tab 500 MG: 1000 mg | ORAL | NDC 50580045711

## 2024-07-06 MED FILL — Multiple Vitamins w/ Minerals Tab: 1.0000 | ORAL | Qty: 1 | Status: AC

## 2024-07-06 MED FILL — Folic Acid Tab 1 MG: 1.0000 mg | ORAL | Qty: 1 | Status: AC

## 2024-07-06 MED FILL — Acetaminophen IV Soln 10 MG/ML: 1000.0000 mg | INTRAVENOUS | Qty: 100 | Status: AC

## 2024-07-06 MED FILL — Thiamine Mononitrate Tab 100 MG: 100.0000 mg | ORAL | Qty: 1 | Status: AC

## 2024-07-06 NOTE — Plan of Care (Signed)
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
## 2024-07-07 LAB — BASIC METABOLIC PANEL WITH GFR
Anion gap: 8 (ref 5–15)
BUN: 17 mg/dL (ref 6–20)
CO2: 28 mmol/L (ref 22–32)
Calcium: 8.5 mg/dL — ABNORMAL LOW (ref 8.9–10.3)
Chloride: 102 mmol/L (ref 98–111)
Creatinine, Ser: 0.83 mg/dL (ref 0.61–1.24)
GFR, Estimated: >60 mL/min
Glucose, Bld: 82 mg/dL (ref 70–99)
Potassium: 3.9 mmol/L (ref 3.5–5.1)
Sodium: 138 mmol/L (ref 135–145)

## 2024-07-07 LAB — SURGICAL PATHOLOGY

## 2024-07-07 LAB — CBC
HCT: 32.1 % — ABNORMAL LOW (ref 39.0–52.0)
Hemoglobin: 10.7 g/dL — ABNORMAL LOW (ref 13.0–17.0)
MCH: 32.2 pg (ref 26.0–34.0)
MCHC: 33.3 g/dL (ref 30.0–36.0)
MCV: 96.7 fL (ref 80.0–100.0)
Platelets: 240 10*3/uL (ref 150–400)
RBC: 3.32 MIL/uL — ABNORMAL LOW (ref 4.22–5.81)
RDW: 14.6 % (ref 11.5–15.5)
WBC: 17 10*3/uL — ABNORMAL HIGH (ref 4.0–10.5)
nRBC: 0 % (ref 0.0–0.2)

## 2024-07-07 MED ADMIN — Buspirone HCl Tab 5 MG: 5 mg | ORAL | NDC 68382018001

## 2024-07-07 MED ADMIN — Buspirone HCl Tab 5 MG: 5 mg | ORAL | NDC 00093005301

## 2024-07-07 MED ADMIN — Ketorolac Tromethamine Inj 15 MG/ML: 30 mg | INTRAVENOUS | NDC 72266023401

## 2024-07-07 MED ADMIN — Enoxaparin Sodium Inj Soln Pref Syr 30 MG/0.3ML: 30 mg | SUBCUTANEOUS | NDC 71288043280

## 2024-07-07 MED ADMIN — Oxycodone HCl Tab 5 MG: 10 mg | ORAL | NDC 00406055223

## 2024-07-07 MED ADMIN — Olanzapine Tab 5 MG: 5 mg | ORAL | NDC 60505311100

## 2024-07-07 MED ADMIN — Acetaminophen Tab 500 MG: 1000 mg | ORAL | NDC 50580045711

## 2024-07-07 MED ADMIN — Methocarbamol Tab 500 MG: 1000 mg | ORAL | NDC 70010075405

## 2024-07-07 MED ADMIN — Thiamine Mononitrate Tab 100 MG: 100 mg | ORAL | NDC 77333093425

## 2024-07-07 MED ADMIN — Sennosides Tab 8.6 MG: 8.6 mg | ORAL | NDC 00904725261

## 2024-07-07 MED ADMIN — Morphine Sulfate IV Soln PF 4 MG/ML: 4 mg | INTRAVENOUS | NDC 00641612501

## 2024-07-07 MED ADMIN — Morphine Sulfate IV Soln PF 4 MG/ML: 4 mg | INTRAVENOUS | NDC 00409189103

## 2024-07-07 MED ADMIN — Piperacillin Sod-Tazobactam Sod in Dex IV Sol 3-0.375GM/50ML: 3.375 g | INTRAVENOUS | NDC 00338963601

## 2024-07-07 MED ADMIN — Folic Acid Tab 1 MG: 1 mg | ORAL | NDC 70752023111

## 2024-07-07 MED ADMIN — THERA M PLUS PO TABS: 1 | ORAL | NDC 1650058694

## 2024-07-07 MED ADMIN — Acetaminophen IV Soln 10 MG/ML: 1000 mg | INTRAVENOUS | NDC 63323043441

## 2024-07-07 MED ADMIN — Methocarbamol Inj 1000 MG/10ML: 500 mg | INTRAVENOUS | NDC 55150022310

## 2024-07-07 MED ADMIN — Methocarbamol Inj 1000 MG/10ML: 1000 mg | INTRAVENOUS | NDC 55150022310

## 2024-07-07 MED FILL — Acetaminophen Tab 500 MG: 1000.0000 mg | ORAL | Qty: 2 | Status: AC

## 2024-07-07 MED FILL — Buspirone HCl Tab 5 MG: 5.0000 mg | ORAL | Qty: 1 | Status: AC

## 2024-07-07 MED FILL — Buspirone HCl Tab 5 MG: 5.0000 mg | ORAL | Qty: 1 | Status: CN

## 2024-07-07 MED FILL — Methocarbamol Inj 1000 MG/10ML: 1000.0000 mg | INTRAMUSCULAR | Qty: 10 | Status: AC

## 2024-07-07 MED FILL — Methocarbamol Tab 500 MG: 1000.0000 mg | ORAL | Qty: 2 | Status: AC

## 2024-07-07 MED FILL — Olanzapine Tab 5 MG: 5.0000 mg | ORAL | Qty: 1 | Status: AC

## 2024-07-07 NOTE — Plan of Care (Signed)
 " Problem: Education: Goal: Knowledge of General Education information will improve Description: Including pain rating scale, medication(s)/side effects and non-pharmacologic comfort measures Outcome: Progressing   Problem: Health Behavior/Discharge Planning: Goal: Ability to manage health-related needs will improve Outcome: Progressing   Problem: Clinical Measurements: Goal: Ability to maintain clinical measurements within normal limits will improve Outcome: Progressing Goal: Will remain free from infection Outcome: Progressing Goal: Diagnostic test results will improve Outcome: Progressing Goal: Respiratory complications will improve Outcome: Progressing Goal: Cardiovascular complication will be avoided Outcome: Progressing   Problem: Activity: Goal: Risk for activity intolerance will decrease Outcome: Progressing   Problem: Nutrition: Goal: Adequate nutrition will be maintained Outcome: Progressing   Problem: Coping: Goal: Level of anxiety will decrease Outcome: Progressing   Problem: Elimination: Goal: Will not experience complications related to bowel motility Outcome: Progressing Goal: Will not experience complications related to urinary retention Outcome: Progressing   Problem: Pain Managment: Goal: General experience of comfort will improve and/or be controlled Outcome: Progressing   Problem: Safety: Goal: Ability to remain free from injury will improve Outcome: Progressing   Problem: Skin Integrity: Goal: Risk for impaired skin integrity will decrease Outcome: Progressing   Problem: Education: Goal: Knowledge of the prescribed therapeutic regimen will improve Outcome: Progressing   Problem: Bowel/Gastric: Goal: Gastrointestinal status for postoperative course will improve Outcome: Progressing   Problem: Cardiac: Goal: Ability to maintain an adequate cardiac output Outcome: Progressing Goal: Will show no evidence of cardiac arrhythmias Outcome:  Progressing   Problem: Nutritional: Goal: Will attain and maintain optimal nutritional status Outcome: Progressing   Problem: Neurological: Goal: Will regain or maintain usual level of consciousness Outcome: Progressing   Problem: Clinical Measurements: Goal: Ability to maintain clinical measurements within normal limits Outcome: Progressing Goal: Postoperative complications will be avoided or minimized Outcome: Progressing   Problem: Respiratory: Goal: Will regain and/or maintain adequate ventilation Outcome: Progressing Goal: Respiratory status will improve Outcome: Progressing   Problem: Skin Integrity: Goal: Demonstrates signs of wound healing without infection Outcome: Progressing   Problem: Urinary Elimination: Goal: Will remain free from infection Outcome: Progressing Goal: Ability to achieve and maintain adequate urine output Outcome: Progressing   Problem: Education: Goal: Knowledge of General Education information will improve Description: Including pain rating scale, medication(s)/side effects and non-pharmacologic comfort measures Outcome: Progressing   Problem: Health Behavior/Discharge Planning: Goal: Ability to manage health-related needs will improve Outcome: Progressing   Problem: Clinical Measurements: Goal: Ability to maintain clinical measurements within normal limits will improve Outcome: Progressing Goal: Will remain free from infection Outcome: Progressing Goal: Diagnostic test results will improve Outcome: Progressing Goal: Respiratory complications will improve Outcome: Progressing Goal: Cardiovascular complication will be avoided Outcome: Progressing   Problem: Activity: Goal: Risk for activity intolerance will decrease Outcome: Progressing   Problem: Nutrition: Goal: Adequate nutrition will be maintained Outcome: Progressing   Problem: Coping: Goal: Level of anxiety will decrease Outcome: Progressing   Problem:  Elimination: Goal: Will not experience complications related to bowel motility Outcome: Progressing Goal: Will not experience complications related to urinary retention Outcome: Progressing   Problem: Pain Managment: Goal: General experience of comfort will improve and/or be controlled Outcome: Progressing   Problem: Safety: Goal: Ability to remain free from injury will improve Outcome: Progressing   Problem: Skin Integrity: Goal: Risk for impaired skin integrity will decrease Outcome: Progressing   Problem: Education: Goal: Knowledge of the prescribed therapeutic regimen will improve Outcome: Progressing   Problem: Bowel/Gastric: Goal: Gastrointestinal status for postoperative course will improve Outcome: Progressing   Problem:  Cardiac: Goal: Ability to maintain an adequate cardiac output Outcome: Progressing Goal: Will show no evidence of cardiac arrhythmias Outcome: Progressing   Problem: Nutritional: Goal: Will attain and maintain optimal nutritional status Outcome: Progressing   Problem: Neurological: Goal: Will regain or maintain usual level of consciousness Outcome: Progressing   Problem: Clinical Measurements: Goal: Ability to maintain clinical measurements within normal limits Outcome: Progressing Goal: Postoperative complications will be avoided or minimized Outcome: Progressing   Problem: Respiratory: Goal: Will regain and/or maintain adequate ventilation Outcome: Progressing Goal: Respiratory status will improve Outcome: Progressing   Problem: Skin Integrity: Goal: Demonstrates signs of wound healing without infection Outcome: Progressing   Problem: Urinary Elimination: Goal: Will remain free from infection Outcome: Progressing Goal: Ability to achieve and maintain adequate urine output Outcome: Progressing   "
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
## 2024-07-08 LAB — CBC
HCT: 31.1 % — ABNORMAL LOW (ref 39.0–52.0)
Hemoglobin: 10.7 g/dL — ABNORMAL LOW (ref 13.0–17.0)
MCH: 32.6 pg (ref 26.0–34.0)
MCHC: 34.4 g/dL (ref 30.0–36.0)
MCV: 94.8 fL (ref 80.0–100.0)
Platelets: 290 10*3/uL (ref 150–400)
RBC: 3.28 MIL/uL — ABNORMAL LOW (ref 4.22–5.81)
RDW: 14.3 % (ref 11.5–15.5)
WBC: 16.4 10*3/uL — ABNORMAL HIGH (ref 4.0–10.5)
nRBC: 0.2 % (ref 0.0–0.2)

## 2024-07-08 LAB — BASIC METABOLIC PANEL WITH GFR
Anion gap: 9 (ref 5–15)
BUN: 17 mg/dL (ref 6–20)
CO2: 28 mmol/L (ref 22–32)
Calcium: 8.4 mg/dL — ABNORMAL LOW (ref 8.9–10.3)
Chloride: 98 mmol/L (ref 98–111)
Creatinine, Ser: 0.76 mg/dL (ref 0.61–1.24)
GFR, Estimated: >60 mL/min
Glucose, Bld: 102 mg/dL — ABNORMAL HIGH (ref 70–99)
Potassium: 3.4 mmol/L — ABNORMAL LOW (ref 3.5–5.1)
Sodium: 135 mmol/L (ref 135–145)

## 2024-07-08 MED ADMIN — Buspirone HCl Tab 5 MG: 5 mg | ORAL | NDC 00093005301

## 2024-07-08 MED ADMIN — Ketorolac Tromethamine Inj 15 MG/ML: 30 mg | INTRAVENOUS | NDC 72266023401

## 2024-07-08 MED ADMIN — Enoxaparin Sodium Inj Soln Pref Syr 30 MG/0.3ML: 30 mg | SUBCUTANEOUS | NDC 71288043280

## 2024-07-08 MED ADMIN — Oxycodone HCl Tab 5 MG: 10 mg | ORAL | NDC 00406055223

## 2024-07-08 MED ADMIN — Potassium Chloride Microencapsulated Crys ER Tab 20 mEq: 40 meq | ORAL | NDC 00245531989

## 2024-07-08 MED ADMIN — Olanzapine Tab 5 MG: 5 mg | ORAL | NDC 60505311100

## 2024-07-08 MED ADMIN — Acetaminophen Tab 500 MG: 1000 mg | ORAL | NDC 50580045711

## 2024-07-08 MED ADMIN — Methocarbamol Tab 500 MG: 1000 mg | ORAL | NDC 70010075405

## 2024-07-08 MED ADMIN — Thiamine Mononitrate Tab 100 MG: 100 mg | ORAL | NDC 77333093425

## 2024-07-08 MED ADMIN — Sennosides Tab 8.6 MG: 8.6 mg | ORAL | NDC 00904725280

## 2024-07-08 MED ADMIN — Hydrocortisone Cream 1%: 1 | TOPICAL | NDC 45802043803

## 2024-07-08 MED ADMIN — Morphine Sulfate IV Soln PF 4 MG/ML: 4 mg | INTRAVENOUS | NDC 00641612501

## 2024-07-08 MED ADMIN — Piperacillin Sod-Tazobactam Sod in Dex IV Sol 3-0.375GM/50ML: 3.375 g | INTRAVENOUS | NDC 00338963601

## 2024-07-08 MED ADMIN — Folic Acid Tab 1 MG: 1 mg | ORAL | NDC 70752023111

## 2024-07-08 MED ADMIN — THERA M PLUS PO TABS: 1 | ORAL | NDC 1650058694

## 2024-07-08 MED FILL — Hydrocortisone Cream 1%: 1.0000 | CUTANEOUS | Qty: 28 | Status: AC

## 2024-07-08 MED FILL — Potassium Chloride Microencapsulated Crys ER Tab 20 mEq: 40.0000 meq | ORAL | Qty: 2 | Status: AC
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
## 2024-07-08 NOTE — Progress Notes (Shared)
 Surgical PA informed of rash noted localized to his left lower side. Not raised smooth.

## 2024-07-08 NOTE — Plan of Care (Signed)
 Patient increasing mobility and advanced to full liquid diet today without complications.
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
## 2024-07-09 LAB — CBC
HCT: 29.6 % — ABNORMAL LOW (ref 39.0–52.0)
Hemoglobin: 10.2 g/dL — ABNORMAL LOW (ref 13.0–17.0)
MCH: 32.2 pg (ref 26.0–34.0)
MCHC: 34.5 g/dL (ref 30.0–36.0)
MCV: 93.4 fL (ref 80.0–100.0)
Platelets: 374 10*3/uL (ref 150–400)
RBC: 3.17 MIL/uL — ABNORMAL LOW (ref 4.22–5.81)
RDW: 14.7 % (ref 11.5–15.5)
WBC: 5.3 10*3/uL (ref 4.0–10.5)
nRBC: 1.1 % — ABNORMAL HIGH (ref 0.0–0.2)

## 2024-07-09 LAB — BASIC METABOLIC PANEL WITH GFR
Anion gap: 9 (ref 5–15)
BUN: 16 mg/dL (ref 6–20)
CO2: 25 mmol/L (ref 22–32)
Calcium: 8.5 mg/dL — ABNORMAL LOW (ref 8.9–10.3)
Chloride: 99 mmol/L (ref 98–111)
Creatinine, Ser: 0.72 mg/dL (ref 0.61–1.24)
GFR, Estimated: >60 mL/min
Glucose, Bld: 107 mg/dL — ABNORMAL HIGH (ref 70–99)
Potassium: 3.7 mmol/L (ref 3.5–5.1)
Sodium: 133 mmol/L — ABNORMAL LOW (ref 135–145)

## 2024-07-09 MED ADMIN — Buspirone HCl Tab 5 MG: 5 mg | ORAL | NDC 00093005301

## 2024-07-09 MED ADMIN — Ketorolac Tromethamine Inj 15 MG/ML: 30 mg | INTRAVENOUS | NDC 72266023401

## 2024-07-09 MED ADMIN — Enoxaparin Sodium Inj Soln Pref Syr 30 MG/0.3ML: 30 mg | SUBCUTANEOUS | NDC 71288043280

## 2024-07-09 MED ADMIN — Oxycodone HCl Tab 5 MG: 10 mg | ORAL | NDC 00406055223

## 2024-07-09 MED ADMIN — Olanzapine Tab 5 MG: 5 mg | ORAL | NDC 60505311100

## 2024-07-09 MED ADMIN — Acetaminophen Tab 500 MG: 1000 mg | ORAL | NDC 50580045711

## 2024-07-09 MED ADMIN — Methocarbamol Tab 500 MG: 1000 mg | ORAL | NDC 70010075405

## 2024-07-09 MED ADMIN — Piperacillin Sod-Tazobactam Sod in Dex IV Sol 3-0.375GM/50ML: 3.375 g | INTRAVENOUS | NDC 00338963601

## 2024-07-09 MED ADMIN — Thiamine Mononitrate Tab 100 MG: 100 mg | ORAL | NDC 77333093425

## 2024-07-09 MED ADMIN — Sennosides Tab 8.6 MG: 8.6 mg | ORAL | NDC 00904725280

## 2024-07-09 MED ADMIN — Hydrocortisone Cream 1%: 1 | TOPICAL | NDC 45802043803

## 2024-07-09 MED ADMIN — Morphine Sulfate IV Soln PF 4 MG/ML: 4 mg | INTRAVENOUS | NDC 00641612501

## 2024-07-09 MED ADMIN — Morphine Sulfate IV Soln PF 4 MG/ML: 4 mg | INTRAVENOUS | NDC 00409189103

## 2024-07-09 MED ADMIN — Folic Acid Tab 1 MG: 1 mg | ORAL | NDC 70752023111

## 2024-07-09 MED ADMIN — THERA M PLUS PO TABS: 1 | ORAL | NDC 1650058694

## 2024-07-09 MED FILL — Piperacillin Sod-Tazobactam Sod in Dex IV Sol 3-0.375GM/50ML: 3.3750 g | INTRAVENOUS | Qty: 50 | Status: AC
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
## 2024-07-09 NOTE — Plan of Care (Signed)

## 2024-07-10 MED ADMIN — Buspirone HCl Tab 5 MG: 5 mg | ORAL | NDC 00093005301

## 2024-07-10 MED ADMIN — Ketorolac Tromethamine Inj 15 MG/ML: 30 mg | INTRAVENOUS | NDC 72266023401

## 2024-07-10 MED ADMIN — Enoxaparin Sodium Inj Soln Pref Syr 30 MG/0.3ML: 30 mg | SUBCUTANEOUS | NDC 71288043280

## 2024-07-10 MED ADMIN — Oxycodone HCl Tab 5 MG: 10 mg | ORAL | NDC 00406055223

## 2024-07-10 MED ADMIN — Olanzapine Tab 5 MG: 5 mg | ORAL | NDC 60505311100

## 2024-07-10 MED ADMIN — Acetaminophen Tab 500 MG: 1000 mg | ORAL | NDC 50580045711

## 2024-07-10 MED ADMIN — Methocarbamol Tab 500 MG: 1000 mg | ORAL | NDC 70010075405

## 2024-07-10 MED ADMIN — Piperacillin Sod-Tazobactam Sod in Dex IV Sol 3-0.375GM/50ML: 3.375 g | INTRAVENOUS | NDC 00338963601

## 2024-07-10 MED ADMIN — Thiamine Mononitrate Tab 100 MG: 100 mg | ORAL | NDC 77333093425

## 2024-07-10 MED ADMIN — Sennosides Tab 8.6 MG: 8.6 mg | ORAL | NDC 00904725280

## 2024-07-10 MED ADMIN — Sennosides Tab 8.6 MG: 8.6 mg | ORAL | NDC 00904725261

## 2024-07-10 MED ADMIN — Hydrocortisone Cream 1%: 1 | TOPICAL | NDC 45802043803

## 2024-07-10 MED ADMIN — Morphine Sulfate IV Soln PF 4 MG/ML: 4 mg | INTRAVENOUS | NDC 00409189101

## 2024-07-10 MED ADMIN — Morphine Sulfate IV Soln PF 4 MG/ML: 4 mg | INTRAVENOUS | NDC 00641612501

## 2024-07-10 MED ADMIN — Folic Acid Tab 1 MG: 1 mg | ORAL | NDC 70752023111

## 2024-07-10 MED ADMIN — THERA M PLUS PO TABS: 1 | ORAL | NDC 1650058694

## 2024-07-10 MED ADMIN — Iohexol IV Soln 350 MG/ML: 75 mL | INTRAVENOUS | NDC 00407141490

## 2024-07-11 MED ADMIN — Buspirone HCl Tab 5 MG: 5 mg | ORAL | NDC 00093005301

## 2024-07-11 MED ADMIN — Enoxaparin Sodium Inj Soln Pref Syr 30 MG/0.3ML: 30 mg | SUBCUTANEOUS | NDC 71288043280

## 2024-07-11 MED ADMIN — Oxycodone HCl Tab 5 MG: 10 mg | ORAL | NDC 10702001850

## 2024-07-11 MED ADMIN — Olanzapine Tab 5 MG: 5 mg | ORAL | NDC 60505311100

## 2024-07-11 MED ADMIN — Acetaminophen Tab 500 MG: 1000 mg | ORAL | NDC 50580045711

## 2024-07-11 MED ADMIN — Methocarbamol Tab 500 MG: 1000 mg | ORAL | NDC 70010075405

## 2024-07-11 MED ADMIN — Piperacillin Sod-Tazobactam Sod in Dex IV Sol 3-0.375GM/50ML: 3.375 g | INTRAVENOUS | NDC 00338963601

## 2024-07-11 MED ADMIN — Thiamine Mononitrate Tab 100 MG: 100 mg | ORAL | NDC 54629005701

## 2024-07-11 MED ADMIN — Sennosides Tab 8.6 MG: 8.6 mg | ORAL | NDC 00904725280

## 2024-07-11 MED ADMIN — Hydrocortisone Cream 1%: 1 | TOPICAL | NDC 45802043803

## 2024-07-11 MED ADMIN — Morphine Sulfate IV Soln PF 4 MG/ML: 4 mg | INTRAVENOUS | NDC 00409189103

## 2024-07-11 MED ADMIN — Folic Acid Tab 1 MG: 1 mg | ORAL | NDC 70752023111

## 2024-07-11 MED ADMIN — THERA M PLUS PO TABS: 1 | ORAL | NDC 1650058694

## 2024-07-12 MED ADMIN — Buspirone HCl Tab 5 MG: 5 mg | ORAL | NDC 00093005301

## 2024-07-12 MED ADMIN — Enoxaparin Sodium Inj Soln Pref Syr 30 MG/0.3ML: 30 mg | SUBCUTANEOUS | NDC 16714001601

## 2024-07-12 MED ADMIN — Enoxaparin Sodium Inj Soln Pref Syr 30 MG/0.3ML: 30 mg | SUBCUTANEOUS | NDC 00781323801

## 2024-07-12 MED ADMIN — Oxycodone HCl Tab 5 MG: 10 mg | ORAL | NDC 00406055223

## 2024-07-12 MED ADMIN — Oxycodone HCl Tab 5 MG: 10 mg | ORAL | NDC 10702001850

## 2024-07-12 MED ADMIN — Olanzapine Tab 5 MG: 5 mg | ORAL | NDC 60505311100

## 2024-07-12 MED ADMIN — Acetaminophen Tab 500 MG: 1000 mg | ORAL | NDC 50580045711

## 2024-07-12 MED ADMIN — Methocarbamol Tab 500 MG: 1000 mg | ORAL | NDC 70010075405

## 2024-07-12 MED ADMIN — Piperacillin Sod-Tazobactam Sod in Dex IV Sol 3-0.375GM/50ML: 3.375 g | INTRAVENOUS | NDC 00338963601

## 2024-07-12 MED ADMIN — Thiamine Mononitrate Tab 100 MG: 100 mg | ORAL | NDC 54629005701

## 2024-07-12 MED ADMIN — Hydrocortisone Cream 1%: 1 | TOPICAL | NDC 45802043803

## 2024-07-12 MED ADMIN — Morphine Sulfate IV Soln PF 4 MG/ML: 4 mg | INTRAVENOUS | NDC 00641612501

## 2024-07-12 MED ADMIN — Morphine Sulfate IV Soln PF 4 MG/ML: 4 mg | INTRAVENOUS | NDC 00409189103

## 2024-07-12 MED ADMIN — Folic Acid Tab 1 MG: 1 mg | ORAL | NDC 70752023111

## 2024-07-12 MED ADMIN — THERA M PLUS PO TABS: 1 | ORAL | NDC 1650058694

## 2024-07-13 MED ADMIN — Buspirone HCl Tab 5 MG: 5 mg | ORAL | NDC 00093005301

## 2024-07-13 MED ADMIN — Enoxaparin Sodium Inj Soln Pref Syr 30 MG/0.3ML: 30 mg | SUBCUTANEOUS | NDC 71288043280

## 2024-07-13 MED ADMIN — Oxycodone HCl Tab 5 MG: 10 mg | ORAL | NDC 10702001850

## 2024-07-13 MED ADMIN — Olanzapine Tab 5 MG: 5 mg | ORAL | NDC 60505311100

## 2024-07-13 MED ADMIN — Acetaminophen Tab 500 MG: 1000 mg | ORAL | NDC 50580045711

## 2024-07-13 MED ADMIN — Methocarbamol Tab 500 MG: 1000 mg | ORAL | NDC 70010075405

## 2024-07-13 MED ADMIN — Piperacillin Sod-Tazobactam Sod in Dex IV Sol 3-0.375GM/50ML: 3.375 g | INTRAVENOUS | NDC 00338963601

## 2024-07-13 MED ADMIN — Morphine Sulfate IV Soln PF 2 MG/ML: 2 mg | INTRAVENOUS | NDC 63323045200

## 2024-07-13 MED ADMIN — Thiamine Mononitrate Tab 100 MG: 100 mg | ORAL | NDC 77333093425

## 2024-07-13 MED ADMIN — Sennosides Tab 8.6 MG: 8.6 mg | ORAL | NDC 00904725280

## 2024-07-13 MED ADMIN — Hydrocortisone Cream 1%: 1 | TOPICAL | NDC 45802043803

## 2024-07-13 MED ADMIN — Morphine Sulfate IV Soln PF 4 MG/ML: 4 mg | INTRAVENOUS | NDC 00641612501

## 2024-07-13 MED ADMIN — Folic Acid Tab 1 MG: 1 mg | ORAL | NDC 70752023111

## 2024-07-13 MED ADMIN — THERA M PLUS PO TABS: 1 | ORAL | NDC 1650058694

## 2024-07-14 MED ADMIN — Buspirone HCl Tab 5 MG: 5 mg | ORAL | NDC 00093005301

## 2024-07-14 MED ADMIN — Enoxaparin Sodium Inj Soln Pref Syr 30 MG/0.3ML: 30 mg | SUBCUTANEOUS | NDC 00548560100

## 2024-07-14 MED ADMIN — Oxycodone HCl Tab 5 MG: 10 mg | ORAL | NDC 68084035411

## 2024-07-14 MED ADMIN — Oxycodone HCl Tab 5 MG: 10 mg | ORAL | NDC 10702001850

## 2024-07-14 MED ADMIN — Olanzapine Tab 5 MG: 5 mg | ORAL | NDC 60505311100

## 2024-07-14 MED ADMIN — Acetaminophen Tab 500 MG: 1000 mg | ORAL | NDC 50580045711

## 2024-07-14 MED ADMIN — Methocarbamol Tab 500 MG: 1000 mg | ORAL | NDC 70010075405

## 2024-07-14 MED ADMIN — Morphine Sulfate IV Soln PF 2 MG/ML: 2 mg | INTRAVENOUS | NDC 76045000401

## 2024-07-14 MED ADMIN — Thiamine Mononitrate Tab 100 MG: 100 mg | ORAL | NDC 54629005701

## 2024-07-14 MED ADMIN — Sennosides Tab 8.6 MG: 8.6 mg | ORAL | NDC 00904725280

## 2024-07-14 MED ADMIN — Hydrocortisone Cream 1%: 1 | TOPICAL | NDC 45802043803

## 2024-07-14 MED ADMIN — Folic Acid Tab 1 MG: 1 mg | ORAL | NDC 70752023111

## 2024-07-14 MED ADMIN — THERA M PLUS PO TABS: 1 | ORAL | NDC 1650058694

## 2024-07-15 MED ADMIN — Buspirone HCl Tab 5 MG: 5 mg | ORAL | NDC 00093005301

## 2024-07-15 MED ADMIN — Enoxaparin Sodium Inj Soln Pref Syr 30 MG/0.3ML: 30 mg | SUBCUTANEOUS | NDC 00548560100

## 2024-07-15 MED ADMIN — Oxycodone HCl Tab 5 MG: 10 mg | ORAL | NDC 10702001850

## 2024-07-15 MED ADMIN — Haemophilus B Polysaccharide Conjugate Vac For Inj 10 MCG: 0.5 mL | INTRAMUSCULAR | NDC 58160072615

## 2024-07-15 MED ADMIN — Acetaminophen Tab 500 MG: 1000 mg | ORAL | NDC 50580045711

## 2024-07-15 MED ADMIN — Methocarbamol Tab 500 MG: 1000 mg | ORAL | NDC 70010075405

## 2024-07-15 MED ADMIN — Thiamine Mononitrate Tab 100 MG: 100 mg | ORAL | NDC 54629005701

## 2024-07-15 MED ADMIN — Folic Acid Tab 1 MG: 1 mg | ORAL | NDC 70752023111

## 2024-07-15 MED ADMIN — THERA M PLUS PO TABS: 1 | ORAL | NDC 1650058694

## 2024-07-15 MED ADMIN — Meningococcal Vac B (Recomb OMV Adjuv) Inj Prefilled Syringe: 0.5 mL | INTRAMUSCULAR | NDC 58160097602

## 2024-07-15 MED ADMIN — Pneumococcal 20-Valent Conjugate Vaccine Sus Pref Syr 0.5 ML: 0.5 mL | INTRAMUSCULAR | NDC 00005200002

## 2024-07-15 MED ADMIN — Meningococcal (A, C, Y, and W-135) Oligo Conj Vac For Inj: 0.5 mL | INTRAMUSCULAR | NDC 58160095509

## 2024-07-16 ENCOUNTER — Encounter (HOSPITAL_COMMUNITY): Payer: Self-pay | Admitting: Physician Assistant

## 2024-07-17 ENCOUNTER — Encounter: Payer: Self-pay | Admitting: *Deleted

## 2024-07-17 ENCOUNTER — Ambulatory Visit: Payer: MEDICAID | Admitting: *Deleted

## 2024-07-17 VITALS — BP 123/76 | HR 118 | Temp 98.3°F | Ht 71.0 in | Wt 215.0 lb

## 2024-07-17 DIAGNOSIS — F4310 Post-traumatic stress disorder, unspecified: Secondary | ICD-10-CM

## 2024-07-17 DIAGNOSIS — F411 Generalized anxiety disorder: Secondary | ICD-10-CM

## 2024-07-17 DIAGNOSIS — S31139D Puncture wound of abdominal wall without foreign body, unspecified quadrant without penetration into peritoneal cavity, subsequent encounter: Secondary | ICD-10-CM

## 2024-07-17 DIAGNOSIS — F319 Bipolar disorder, unspecified: Secondary | ICD-10-CM

## 2024-07-17 DIAGNOSIS — F422 Mixed obsessional thoughts and acts: Secondary | ICD-10-CM

## 2024-07-17 MED ORDER — OLANZAPINE 10 MG PO TABS: 10.0000 mg | ORAL_TABLET | Freq: Every day | ORAL | 0 refills | Status: AC

## 2024-07-17 MED ORDER — BUSPIRONE HCL 15 MG PO TABS: 15.0000 mg | ORAL_TABLET | Freq: Three times a day (TID) | ORAL | 0 refills | Status: AC

## 2024-07-17 MED ORDER — SERTRALINE HCL 100 MG PO TABS: 100.0000 mg | ORAL_TABLET | Freq: Every day | ORAL | 0 refills | Status: AC

## 2024-07-17 MED ORDER — HYDROXYZINE PAMOATE 25 MG PO CAPS: 25.0000 mg | ORAL_CAPSULE | Freq: Two times a day (BID) | ORAL | 0 refills | Status: AC

## 2024-07-17 NOTE — Progress Notes (Signed)
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 total) by mouth at bedtime. 30 tablet 1   OLANZapine  (ZYPREXA ) 10 MG tablet Take 10 mg by mouth at bedtime.     oxyCODONE  (OXY IR/ROXICODONE ) 5 MG immediate release tablet Take 1-2 tablets (5-10 mg total) by mouth every 4 (four) hours as needed (5mg  for moderate pain, 10mg  for severe pain). 30 tablet 0   No current facility-administered medications on file prior to visit.  [2] No Known Allergies  "

## 2024-07-17 NOTE — Assessment & Plan Note (Signed)
 Patient was referred back to primary care to restart his behavioral health medications. I restarted his previous home medications for 30 days until he gets his a appointment with [facility] behavioral [facility]. He understands that he will have to see a behavioral health specialist for further refills I have encouraged him to consider talk therapy as this most likely would be beneficial Orders:   busPIRone  (BUSPAR ) 15 MG tablet; Take 1 tablet (15 mg total) by mouth 3 (three) times daily.   sertraline  (ZOLOFT ) 100 MG tablet; Take 1 tablet (100 mg total) by mouth daily.   hydrOXYzine  (VISTARIL ) 25 MG capsule; Take 1 capsule (25 mg total) by mouth in the morning and at bedtime.

## 2024-07-17 NOTE — Patient Instructions (Addendum)
 Today your blood pressure was 123/76 with a pulse of 118.  Most likely your pulse rate is elevated due to your anxiety over your situation. We talked about your wound care which looks like that is going well. You did ask for a refill of your mental health meds which I will send to the [facility] on [address].  Remember to make an appointment with behavioral health through [facility] to get your medications moving forward You do have a follow-up with Dr. Paola on 2/17 that she should certainly keep. Also we were minded you of the outpatient rehab appointment that she you need to make and keep. At that appointment they will do a evaluation and plan of care that may include going to the physical therapist or a home program. Call if you have any questions or concerns Keep your appointment in 30 days with us 

## 2024-07-23 ENCOUNTER — Ambulatory Visit: Payer: MEDICAID

## 2024-07-29 ENCOUNTER — Ambulatory Visit: Payer: MEDICAID

## 2024-07-31 ENCOUNTER — Ambulatory Visit: Payer: MEDICAID

## 2024-08-14 ENCOUNTER — Ambulatory Visit: Payer: Self-pay | Admitting: *Deleted
